# Patient Record
Sex: Female | Born: 1990 | Race: Black or African American | Hispanic: No | Marital: Single | Smoking: Current some day smoker
Health system: Southern US, Community
[De-identification: ages and names within clinical notes are randomized; demographics above are authoritative.]

## PROBLEM LIST (undated history)

## (undated) ENCOUNTER — Ambulatory Visit: Payer: Medicaid Other | Source: Home / Self Care

## (undated) DIAGNOSIS — L219 Seborrheic dermatitis, unspecified: Secondary | ICD-10-CM

## (undated) DIAGNOSIS — H60331 Swimmer's ear, right ear: Secondary | ICD-10-CM

## (undated) DIAGNOSIS — L0100 Impetigo, unspecified: Secondary | ICD-10-CM

## (undated) DIAGNOSIS — F419 Anxiety disorder, unspecified: Secondary | ICD-10-CM

## (undated) DIAGNOSIS — L409 Psoriasis, unspecified: Secondary | ICD-10-CM

## (undated) DIAGNOSIS — D509 Iron deficiency anemia, unspecified: Secondary | ICD-10-CM

## (undated) DIAGNOSIS — L810 Postinflammatory hyperpigmentation: Secondary | ICD-10-CM

## (undated) DIAGNOSIS — D649 Anemia, unspecified: Secondary | ICD-10-CM

## (undated) DIAGNOSIS — I10 Essential (primary) hypertension: Secondary | ICD-10-CM

## (undated) DIAGNOSIS — Z5189 Encounter for other specified aftercare: Secondary | ICD-10-CM

## (undated) DIAGNOSIS — L6681 Central centrifugal cicatricial alopecia: Secondary | ICD-10-CM

## (undated) DIAGNOSIS — L668 Other cicatricial alopecia: Secondary | ICD-10-CM

## (undated) DIAGNOSIS — K5010 Crohn's disease of large intestine without complications: Secondary | ICD-10-CM

## (undated) DIAGNOSIS — L669 Cicatricial alopecia, unspecified: Secondary | ICD-10-CM

## (undated) HISTORY — DX: Psoriasis, unspecified: L40.9

## (undated) HISTORY — PX: BOWEL RESECTION: SHX1257

## (undated) HISTORY — PX: UPPER GASTROINTESTINAL ENDOSCOPY: SHX188

## (undated) HISTORY — DX: Anemia, unspecified: D64.9

## (undated) HISTORY — PX: COLONOSCOPY: SHX174

## (undated) HISTORY — DX: Seborrheic dermatitis, unspecified: L21.9

## (undated) HISTORY — DX: Central centrifugal cicatricial alopecia: L66.81

## (undated) HISTORY — DX: Postinflammatory hyperpigmentation: L81.0

## (undated) HISTORY — DX: Swimmer's ear, right ear: H60.331

## (undated) HISTORY — DX: Encounter for other specified aftercare: Z51.89

## (undated) HISTORY — DX: Essential (primary) hypertension: I10

## (undated) HISTORY — DX: Iron deficiency anemia, unspecified: D50.9

## (undated) HISTORY — DX: Anxiety disorder, unspecified: F41.9

## (undated) HISTORY — DX: Other cicatricial alopecia: L66.8

## (undated) HISTORY — PX: APPENDECTOMY: SHX54

## (undated) HISTORY — DX: Cicatricial alopecia, unspecified: L66.9

## (undated) HISTORY — DX: Impetigo, unspecified: L01.00

---

## 2013-06-07 DIAGNOSIS — D849 Immunodeficiency, unspecified: Secondary | ICD-10-CM | POA: Insufficient documentation

## 2013-09-11 DIAGNOSIS — R21 Rash and other nonspecific skin eruption: Secondary | ICD-10-CM | POA: Insufficient documentation

## 2013-09-11 DIAGNOSIS — K6289 Other specified diseases of anus and rectum: Secondary | ICD-10-CM | POA: Insufficient documentation

## 2015-11-27 DIAGNOSIS — K5090 Crohn's disease, unspecified, without complications: Secondary | ICD-10-CM | POA: Insufficient documentation

## 2016-12-21 DIAGNOSIS — N83209 Unspecified ovarian cyst, unspecified side: Secondary | ICD-10-CM | POA: Insufficient documentation

## 2019-01-14 DIAGNOSIS — O1495 Unspecified pre-eclampsia, complicating the puerperium: Secondary | ICD-10-CM | POA: Insufficient documentation

## 2019-01-14 HISTORY — DX: Unspecified pre-eclampsia, complicating the puerperium: O14.95

## 2019-12-20 DIAGNOSIS — H16223 Keratoconjunctivitis sicca, not specified as Sjogren's, bilateral: Secondary | ICD-10-CM | POA: Diagnosis not present

## 2020-01-10 ENCOUNTER — Ambulatory Visit: Payer: Medicaid Other | Admitting: Family Medicine

## 2020-01-10 ENCOUNTER — Other Ambulatory Visit: Payer: Self-pay

## 2020-01-26 ENCOUNTER — Encounter: Payer: Self-pay | Admitting: Emergency Medicine

## 2020-01-26 ENCOUNTER — Other Ambulatory Visit: Payer: Self-pay

## 2020-01-26 ENCOUNTER — Ambulatory Visit
Admission: EM | Admit: 2020-01-26 | Discharge: 2020-01-26 | Disposition: A | Discharge status: Home / Self Care | Payer: Medicaid Other | Attending: Physician Assistant | Admitting: Physician Assistant

## 2020-01-26 DIAGNOSIS — L219 Seborrheic dermatitis, unspecified: Secondary | ICD-10-CM | POA: Diagnosis not present

## 2020-01-26 DIAGNOSIS — H16141 Punctate keratitis, right eye: Secondary | ICD-10-CM | POA: Diagnosis not present

## 2020-01-26 HISTORY — DX: Crohn's disease of large intestine without complications: K50.10

## 2020-01-26 MED ORDER — KETOCONAZOLE 2 % EX SHAM: 1.0000 "application " | MEDICATED_SHAMPOO | CUTANEOUS | 0 refills | Status: DC

## 2020-01-26 NOTE — ED Notes (Signed)
Patient able to ambulate independently  

## 2020-01-26 NOTE — Discharge Instructions (Signed)
Start ketoconazole shampoo as directed for 2-4 weeks. Mineral oil to the scalp. Avoid scratching area. Start allergy medicine such as zyrtec daily for the next 4 weeks. Follow up with PCP/dermatology if symptoms not improving.

## 2020-01-26 NOTE — ED Triage Notes (Signed)
Pt presents to [facility] for assessment of lumps to her head which are releasing discharge sometimes.  Patient states it has been going on x 1 month.  Patient states she is not currently established in the area with a PCP and is concerned because she has been off of her medications.

## 2020-01-26 NOTE — ED Provider Notes (Signed)
[name]    [name]: [id number] Arrival date & time: 01/26/20  1732      History   Chief Complaint Chief Complaint  Patient presents with  . Headache    HPI Erika Stephens is a 29 y.o. female.   29 year old female with history of Crohn's colitis comes in for 1 month history of scalp discomfort, " lumps" to the scalp.  States has had crusting/peeling of the scalp with pain.  Has had oozing/drainage that is clear.  Denies spreading erythema, warmth, fever.  At times has had itching and sensation.  Denies changes in hygiene product.  Has tried over-the-counter medications without relief.     Past Medical History:  Diagnosis Date  . Crohn's colitis ([place])     There are no problems to display for this patient.   Past Surgical History:  Procedure Laterality Date  . APPENDECTOMY    . BOWEL RESECTION      OB History   No obstetric history on file.      Home Medications    Prior to Admission medications   Medication Sig Start Date End Date Taking? Authorizing Provider  ketoconazole (NIZORAL) 2 % shampoo Apply 1 application topically 2 (two) times a week. 01/29/20   Ok Edwards, PA-C    Family History Family History  Problem Relation Age of Onset  . Crohn's disease Mother   . Schizophrenia Father   . Bipolar disorder Father     Social History Social History   Tobacco Use  . Smoking status: Never Smoker  . Smokeless tobacco: Never Used  Substance Use Topics  . Alcohol use: Yes    Comment: socially  . Drug use: Yes    Types: Marijuana     Allergies   Aspirin   Review of Systems Review of Systems  Reason unable to perform ROS: See HPI as above.     Physical Exam Triage Vital Signs ED Triage Vitals  Enc Vitals Group     BP 01/26/20 1744 113/69     Pulse Rate 01/26/20 1744 84     Resp 01/26/20 1744 16     Temp 01/26/20 1744 97.8 F (36.6 C)     Temp Source 01/26/20 1744 Temporal     SpO2 01/26/20 1744 99 %     Weight --      Height  --      Head Circumference --      Peak Flow --      Pain Score 01/26/20 1745 6     Pain Loc --      Pain Edu? --      Excl. in [place]? --    No data found.  Updated Vital Signs BP 113/69 (BP Location: Left Arm)   Pulse 84   Temp 97.8 F (36.6 C) (Temporal)   Resp 16   LMP 01/19/2020   SpO2 99%   Physical Exam Constitutional:      General: She is not in acute distress.    Appearance: Normal appearance. She is well-developed. She is not toxic-appearing or diaphoretic.  HENT:     Head: Normocephalic and atraumatic.  Eyes:     Conjunctiva/sclera: Conjunctivae normal.     Pupils: Pupils are equal, round, and reactive to light.  Pulmonary:     Effort: Pulmonary effort is normal. No respiratory distress.     Comments: Speaking in full sentences without difficulty Musculoskeletal:     Cervical back: Normal range of motion and neck supple.  Skin:    General: Skin is warm and dry.     Comments: Patches of dry, flaky and peeling skin to the scalp.  No surrounding erythema.  No golden crusts.  Neurological:     Mental Status: She is alert and oriented to person, place, and time.      UC Treatments / Results  Labs (all labs ordered are listed, but only abnormal results are displayed) Labs Reviewed - No data to display  EKG   Radiology No results found.  Procedures Procedures (including critical care time)  Medications Ordered in UC Medications - No data to display  Initial Impression / Assessment and Plan / UC Course  I have reviewed the triage vital signs and the nursing notes.  Pertinent labs & imaging results that were available during my care of the patient were reviewed by me and considered in my medical decision making (see chart for details).    Discussed history and exam most consistent with seborrheic dermatitis.  Will treat with ketoconazole shampoo, and mineral oil.  Return precautions given.  Otherwise patient to follow-up with PCP/dermatology for further  evaluation and management needed if symptoms not improving.  Patient expresses understanding and agrees to plan.  Final Clinical Impressions(s) / UC Diagnoses   Final diagnoses:  Seborrheic dermatitis of scalp   ED Prescriptions    Medication Sig Dispense Auth. Provider   ketoconazole (NIZORAL) 2 % shampoo Apply 1 application topically 2 (two) times a week. 120 mL Ok Edwards, PA-C     PDMP not reviewed this encounter.   Ok Edwards, PA-C 01/26/20 1906

## 2020-01-30 DIAGNOSIS — Z23 Encounter for immunization: Secondary | ICD-10-CM | POA: Diagnosis not present

## 2020-01-31 DIAGNOSIS — H16141 Punctate keratitis, right eye: Secondary | ICD-10-CM | POA: Diagnosis not present

## 2020-02-08 DIAGNOSIS — K5090 Crohn's disease, unspecified, without complications: Secondary | ICD-10-CM | POA: Diagnosis not present

## 2020-02-08 DIAGNOSIS — J3489 Other specified disorders of nose and nasal sinuses: Secondary | ICD-10-CM | POA: Diagnosis not present

## 2020-02-08 DIAGNOSIS — Z7689 Persons encountering health services in other specified circumstances: Secondary | ICD-10-CM | POA: Diagnosis not present

## 2020-02-12 ENCOUNTER — Emergency Department (HOSPITAL_COMMUNITY)
Admission: EM | Admit: 2020-02-12 | Discharge: 2020-02-12 | Disposition: A | Discharge status: Home / Self Care | Payer: Medicaid Other | Attending: Emergency Medicine | Admitting: Emergency Medicine

## 2020-02-12 ENCOUNTER — Other Ambulatory Visit: Payer: Self-pay

## 2020-02-12 ENCOUNTER — Encounter (HOSPITAL_COMMUNITY): Payer: Self-pay

## 2020-02-12 DIAGNOSIS — K5090 Crohn's disease, unspecified, without complications: Secondary | ICD-10-CM | POA: Insufficient documentation

## 2020-02-12 DIAGNOSIS — R21 Rash and other nonspecific skin eruption: Secondary | ICD-10-CM | POA: Diagnosis not present

## 2020-02-12 MED ORDER — PREDNISONE 20 MG PO TABS: 40.0000 mg | ORAL_TABLET | Freq: Every day | ORAL | 0 refills | Status: AC

## 2020-02-12 MED ORDER — CEPHALEXIN 500 MG PO CAPS: 500.0000 mg | ORAL_CAPSULE | Freq: Two times a day (BID) | ORAL | 0 refills | Status: AC

## 2020-02-12 NOTE — ED Notes (Signed)
Pt verbalizes understanding of DC instructions. Pt belongings returned and is ambulatory out of ED.  

## 2020-02-12 NOTE — ED Triage Notes (Signed)
Pt reports a rash on top of her head and a small lump on the posterior occipital left side of her head. Reports a hx of Crohn's and states that she is new to the area and needs her medications refill. Reports these scalp symptoms for about a month. States urgent care gave her shampoo for cradle cap, but it has not improved. A&Ox4.

## 2020-02-12 NOTE — Discharge Instructions (Addendum)
I have prescribed antibiotics to help treat your symptoms. Please take 1 tablet twice a day for the next 7 days.  I have provided a prescription for steroids.  Please take 2 tablets daily for the next 5 days.  Please be aware this medication can cause flushness, insomnia and appetite changes.  The number to dermatology is attached to your chart, please schedule an appointment with them for follow up.

## 2020-02-12 NOTE — ED Provider Notes (Signed)
Erika Stephens Provider Note   [name]: [id number] Arrival date & time: 02/12/20  2011     History Chief Complaint  Patient presents with  . Rash    Erika Stephens is a 29 y.o. female.  29 y.o female with a PMH of Chron's colitis sent to the ED with a chief complaint of scalp pain x 5 days. Patient was seen at urgent care on 31 March, she was given improved to help with what they thought was cradle cap.  The history is provided by the patient.  Rash Associated symptoms: no fever and no headaches        Past Medical History:  Diagnosis Date  . Crohn's colitis ([place])     There are no problems to display for this patient.   Past Surgical History:  Procedure Laterality Date  . APPENDECTOMY    . BOWEL RESECTION       OB History   No obstetric history on file.     Family History  Problem Relation Age of Onset  . Crohn's disease Mother   . Schizophrenia Father   . Bipolar disorder Father     Social History   Tobacco Use  . Smoking status: Never Smoker  . Smokeless tobacco: Never Used  Substance Use Topics  . Alcohol use: Yes    Comment: socially  . Drug use: Yes    Types: Marijuana    Home Medications Prior to Admission medications   Medication Sig Start Date End Date Taking? Authorizing Provider  cephALEXin (KEFLEX) 500 MG capsule Take 1 capsule (500 mg total) by mouth 2 (two) times daily for 7 days. 02/12/20 02/19/20  Erika Fitting, PA-C  ketoconazole (NIZORAL) 2 % shampoo Apply 1 application topically 2 (two) times a week. 01/29/20   Erika Stephens, Amy V, PA-C  predniSONE (DELTASONE) 20 MG tablet Take 2 tablets (40 mg total) by mouth daily for 5 days. 02/12/20 02/17/20  Erika Fitting, PA-C    Allergies    Aspirin  Review of Systems   Review of Systems  Constitutional: Negative for fever.  Skin: Positive for rash.  Neurological: Negative for light-headedness and headaches.    Physical Exam Updated Vital Signs BP (!) 144/82 (BP  Location: Right Arm)   Pulse 83   Temp 99.2 F (37.3 C) (Oral)   Resp 16   Ht 5' 3"  (1.6 m)   Wt 65.8 kg   LMP 01/19/2020   SpO2 100%   BMI 25.69 kg/m   Physical Exam Vitals and nursing note reviewed.  Constitutional:      Appearance: Normal appearance.  HENT:     Head: Normocephalic and atraumatic.      Comments: Pustular crusting lesions noted throughout, patch with oozing noted from the occipital aspect, appears irritated.     Nose: Nose normal.     Mouth/Throat:     Mouth: Mucous membranes are moist.  Eyes:     Pupils: Pupils are equal, round, and reactive to light.  Cardiovascular:     Rate and Rhythm: Normal rate.  Pulmonary:     Effort: Pulmonary effort is normal.     Breath sounds: No wheezing.  Abdominal:     General: Abdomen is flat.  Musculoskeletal:     Cervical back: Normal range of motion and neck supple.  Skin:    General: Skin is warm.     Findings: Erythema present.  Neurological:     Mental Status: She is alert and oriented to person, place,  and time.     ED Results / Procedures / Treatments   Labs (all labs ordered are listed, but only abnormal results are displayed) Labs Reviewed - No data to display  EKG None  Radiology No results found.  Procedures Procedures (including critical care time)  Medications Ordered in ED Medications - No data to display  ED Course  I have reviewed the triage vital signs and the nursing notes.  Pertinent labs & imaging results that were available during my care of the patient were reviewed by me and considered in my medical decision making (see chart for details).    MDM Rules/Calculators/A&P    Patient arrives to the ED with complaints of rash which began 5 days ago.  She was seen at urgent care, was told this was likely cradle cap, was given shampoo to help with her symptoms without improvement in her symptoms.  Today she arrived with multiple pustular lesions around the left side of her head,  no vesicular lesion noted.  Does report pain to the area along with itching.  She was lying ketoconazole shampoo which she reports is likely worsened her symptoms.  No changes in vision, no headaches, no involvement of the facial region.  Not in a dermatomal pattern, lower suspicion for shingles.  She is a history of Crohn's, has not had her Remicade medication x2 as she reports she recently relocated here from out of town.Therefore has not been on steroid therapy.   Lesions appear inflammatory in nature.  Denies any fevers, changes in vision, neck pain.  We discussed treatment with steroids along with antibiotics to help with symptoms.  She is agreeable to this therapy.  She will also need to follow-up with dermatology in order to obtain further supervision of rash. No mucosal involvement. Patient stable for discharge.    Portions of this note were generated with [employer]. Dictation errors may occur despite best attempts at proofreading.  Final Clinical Impression(s) / ED Diagnoses Final diagnoses:  Rash    Rx / DC Orders ED Discharge Orders         Ordered    predniSONE (DELTASONE) 20 MG tablet  Daily     02/12/20 2244    cephALEXin (KEFLEX) 500 MG capsule  2 times daily     02/12/20 2244           Erika Fitting, PA-C 02/12/20 2253    Valarie Merino, MD 02/14/20 [phone]

## 2020-02-13 ENCOUNTER — Telehealth: Payer: Self-pay | Admitting: Gastroenterology

## 2020-02-13 NOTE — Telephone Encounter (Signed)
Dr. Rush Landmark, (D.O.D)  We have received a referral for this pt for Crohns disease/ remicade infusion.  This pt has GI HX out of state [facility] [place])  Pt has indicated that it has been less than a year that she was seen.  She just moved here.  She stated that she is having issues and is due for colonoscopy as well.  I have asked her to obtain records/pathology for your review. Is it possible to see this pt without these records first? Please  Advise.

## 2020-02-14 NOTE — Telephone Encounter (Signed)
Patient could be seen, however, I cannot and do not feel comfortable with placing any infusion information without it having been reviewed. She will need her prior providers or her PCP to have this completed until she can be seen in GI. She can be seen with any of the GI providers or with the PAs/Nps but without the information and timing and dosing and history it is hard for any of [place] to move forward with placing Remicade orders. She can be placed on my schedule on a 350 slot but needs to have her history/information all sent to [place] for review prior to the clinic visit. Thank you. GM

## 2020-02-21 DIAGNOSIS — L219 Seborrheic dermatitis, unspecified: Secondary | ICD-10-CM | POA: Diagnosis not present

## 2020-02-27 DIAGNOSIS — Z23 Encounter for immunization: Secondary | ICD-10-CM | POA: Diagnosis not present

## 2020-02-27 DIAGNOSIS — L309 Dermatitis, unspecified: Secondary | ICD-10-CM | POA: Diagnosis not present

## 2020-02-27 DIAGNOSIS — L219 Seborrheic dermatitis, unspecified: Secondary | ICD-10-CM | POA: Diagnosis not present

## 2020-02-29 NOTE — Telephone Encounter (Signed)
Letter mailed to have the pt call our office

## 2020-02-29 NOTE — Telephone Encounter (Signed)
I have partially reviewed her notes that have come in. She has been followed in [facility] by [facility]. However, no clinic note documentation since 2018. Last infusion that I can see in the documentation was in May 2020. Please get the last 4 years worth of clinic notes so that I can review this. Please get her most recent Colonoscopy (last I see was in 2018). Please get the most recent notes in regards to her Remicade infusions. These will be helpful before her clinic visit next week. Thanks. GM

## 2020-02-29 NOTE — Telephone Encounter (Signed)
Tried to reach pt and no answer, voice mail full.

## 2020-03-07 ENCOUNTER — Ambulatory Visit: Payer: Medicaid Other | Admitting: Gastroenterology

## 2020-03-07 ENCOUNTER — Telehealth: Payer: Self-pay

## 2020-03-07 ENCOUNTER — Other Ambulatory Visit: Payer: Self-pay

## 2020-03-07 ENCOUNTER — Encounter: Payer: Self-pay | Admitting: Gastroenterology

## 2020-03-07 VITALS — BP 102/62 | HR 90 | Temp 97.8°F | Ht 61.0 in | Wt 137.0 lb

## 2020-03-07 DIAGNOSIS — R140 Abdominal distension (gaseous): Secondary | ICD-10-CM | POA: Diagnosis not present

## 2020-03-07 DIAGNOSIS — Z79899 Other long term (current) drug therapy: Secondary | ICD-10-CM

## 2020-03-07 DIAGNOSIS — R1084 Generalized abdominal pain: Secondary | ICD-10-CM | POA: Diagnosis not present

## 2020-03-07 DIAGNOSIS — K50819 Crohn's disease of both small and large intestine with unspecified complications: Secondary | ICD-10-CM

## 2020-03-07 DIAGNOSIS — Z9225 Personal history of immunosupression therapy: Secondary | ICD-10-CM

## 2020-03-07 DIAGNOSIS — Z862 Personal history of diseases of the blood and blood-forming organs and certain disorders involving the immune mechanism: Secondary | ICD-10-CM

## 2020-03-07 DIAGNOSIS — K529 Noninfective gastroenteritis and colitis, unspecified: Secondary | ICD-10-CM | POA: Diagnosis not present

## 2020-03-07 DIAGNOSIS — Z79620 Long term (current) use of immunosuppressive biologic: Secondary | ICD-10-CM

## 2020-03-07 MED ORDER — SUPREP BOWEL PREP KIT 17.5-3.13-1.6 GM/177ML PO SOLN: 1.0000 | ORAL | 0 refills | Status: DC

## 2020-03-07 NOTE — Progress Notes (Signed)
[place] VISIT   Primary Care Provider Medicine, [facility] [place] [address] [phone]  Referring Provider Medicine, [facility] [address] [place],  [place] [address] [phone]  Patient Profile: Erika Stephens is a 29 y.o. female with a pmh significant for Crohn's disease (diagnosed in 2011 and maintained on Remicade on/off in [place] and then [place]), status post appendectomy and ileocecectomy, anal fissures, possible psoriasis, MDD/anxiety, iron deficiency anemia, status post 3 pregnancies.  The patient presents to the [facility] for an evaluation and management of problem(s) noted below:  Problem List 1. Crohn's disease of small and large intestines with complication ([place])   2. Generalized abdominal pain   3. Bloating symptom   4. Chronic diarrhea   5. History of iron deficiency anemia   6. Infliximab (Remicade) long-term use   7. History of immunosuppression therapy     History of Present Illness This is the patient's first visit to the outpatient [place] clinic.  The patient previously lived in [place] where she was diagnosed with Crohn's disease in 2011 at [facility].  I do not have access to those records but this is per the patient's report as well as her [place] provider notation.  Underwent an appendectomy and subsequently an ileocecectomy due to patient continuing to have issues after her appendectomy.  It was at this point in time in 2015 that she ended up being transitioned to Remicade after being on/off steroids per her report.  The patient then moved to [place] where she had been for 3 years and was following with GI providers in one particular group though never saw the same provider over extended periods.  I was able to review the records as well as the Remicade infusions that had been done.  Patient moved to [place] with family for an attempt at a  new chance at life.  She moved in January.  Her last Remicade infusion was in December although I do not have the actual record of that.  Patient states that since January she has had multiple issues develop.  She is having abdominal pain throughout her abdomen.  She is having pyrosis.  She has abdominal bloating as well as distention.  She has had increased flatulence.  She has diarrhea 2-3 times per day noted as a Bristol scale 6-7.  She notes no blood.  She does have fecal urgency.  She has incomplete evacuation.  She has nocturnal symptoms that wake her up at night at times.  She has had a 25 pound weight loss since her last infusion.  Patient was recently diagnosed by [facility] dermatology with new psoriasis.  She has never had psoriasis previously.  She is also noting new lesions on her upper extremities as well as upper thorax.  Patient has been receiving budesonide on/off from her grandmother who has a history of Crohn's disease.  Is not clear that the budesonide has been helpful at all over the course the last 2 months.  Patient is unemployed but recently received [facility].  She has 3 children.  Patient denies any significant nonsteroidal use.  She has not had an upper endoscopy per her report in greater than 10 years.  Her last colonoscopy was in 2018 as documented below.  GI Review of Systems Positive as above Negative for dysphagia, odynophagia, nausea, vomiting, melena, hematochezia  Review of Systems General: Denies fevers/chills/ HEENT: Denies oral lesions/visual changes Cardiovascular: Denies chest pain/palpitations  Pulmonary: Denies shortness of breath/cough Gastroenterological: See HPI Genitourinary: Denies darkened urine or hematuria Hematological: Denies easy bruising/bleeding Endocrine: Denies temperature intolerance Dermatological: Multiple skin manifestations as well as scalp psoriasis; denies jaundice Psychological: Mood is anxious to get better Allergy &  Immunology: Denies severe allergic reactions Musculoskeletal: Has history of previous arthralgias though those are not causing her issues currently s   Medications Current Outpatient Medications  Medication Sig Dispense Refill  . ketoconazole (NIZORAL) 2 % shampoo Apply 1 application topically 2 (two) times a week. 120 mL 0  . Na Sulfate-K Sulfate-Mg Sulf (SUPREP BOWEL PREP KIT) 17.5-3.13-1.6 GM/177ML SOLN Take 1 kit by mouth as directed. For colonoscopy prep 354 mL 0   No current facility-administered medications for this visit.    Allergies Allergies  Allergen Reactions  . Aspirin Hives    Histories Past Medical History:  Diagnosis Date  . Crohn's colitis [facility])    Past Surgical History:  Procedure Laterality Date  . APPENDECTOMY    . BOWEL RESECTION     Social History   Socioeconomic History  . Marital status: Unknown    Spouse name: Not on file  . Number of children: Not on file  . Years of education: Not on file  . Highest education level: Not on file  Occupational History  . Not on file  Tobacco Use  . Smoking status: Never Smoker  . Smokeless tobacco: Never Used  Substance and Sexual Activity  . Alcohol use: Yes    Comment: socially.   . Drug use: Yes    Types: Marijuana    Comment: few times a week   . Sexual activity: Not Currently  Other Topics Concern  . Not on file  Social History Narrative  . Not on file   Social Determinants of Health   Financial Resource Strain:   . Difficulty of Paying Living Expenses:   Food Insecurity:   . Worried About Charity fundraiser in the Last Year:   . Arboriculturist in the Last Year:   Transportation Needs:   . Film/video editor (Medical):   Marland Kitchen Lack of Transportation (Non-Medical):   Physical Activity:   . Days of Exercise per Week:   . Minutes of Exercise per Session:   Stress:   . Feeling of Stress :   Social Connections:   . Frequency of Communication with Friends and Family:   . Frequency of  Social Gatherings with Friends and Family:   . Attends Religious Services:   . Active Member of Clubs or Organizations:   . Attends Archivist Meetings:   Marland Kitchen Marital Status:   Intimate Partner Violence:   . Fear of Current or Ex-Partner:   . Emotionally Abused:   Marland Kitchen Physically Abused:   . Sexually Abused:    Family History  Problem Relation Age of Onset  . Crohn's disease Mother   . Schizophrenia Father   . Bipolar disorder Father   . Crohn's disease Maternal Grandmother   . Prostate cancer Maternal Uncle   . Colon cancer Neg Hx   . Stomach cancer Neg Hx   . Pancreatic cancer Neg Hx   . Esophageal cancer Neg Hx   . Inflammatory bowel disease Neg Hx   . Liver disease Neg Hx   . Rectal cancer Neg Hx    I have reviewed her medical, social, and family history in detail and updated the electronic medical record as necessary.    PHYSICAL EXAMINATION  BP  102/62   Pulse 90   Temp 97.8 F (36.6 C)   Ht 5' 1"  (1.549 m)   Wt 137 lb (62.1 kg)   BMI 25.89 kg/m  Wt Readings from Last 3 Encounters:  03/07/20 137 lb (62.1 kg)  02/12/20 145 lb (65.8 kg)  GEN: NAD, appears stated age, doesn't appear chronically ill PSYCH: Cooperative, without pressured speech EYE: Conjunctivae pink, sclerae anicteric ENT: MMM, without oral ulcers, no erythema or exudates noted NECK: Supple CV: RR without R/Gs  RESP: CTAB posteriorly, without wheezing GI: NABS, soft, tenderness to palpation generalized throughout the abdomen, volitional guarding, no rebound, surgical scars present from previous resections MSK/EXT: No lower extremity edema SKIN: Tattoos present, patient has multiple darkened papules though not clearly erythema nodosum or pyoderma, plaque psoriasis noted on scalp; no jaundice NEURO:  Alert & Oriented x 3, no focal deficits   REVIEW OF DATA  I reviewed the following data at the time of this encounter:  GI Procedures and Studies  November 2016 colonoscopy done at [facility]  GI Perianal and digital rectal exams were normal. The terminal ileum appeared normal.  Biopsies were taken for histology. There was evidence of a prior end-to-end ileocolonic anastomosis in the ascending colon which was characterized by healthy-appearing mucosa.  The anastomosis was traversed. Pathology Terminal ileal biopsies show active ileitis without granulomas or crypt distortion.  Findings are nonspecific but could include Crohn's disease or infection or medication injury. Right colon biopsies were normal showing no evidence of crypt distortion disarray or inflammation and negative for dysplasia. Left colon biopsies were negative for crypt distortion, disarray, significant inflammation.  Negative for dysplasia.  June 2018 colonoscopy Normal mucosa entire colon.  Biopsies were taken for histology. There was evidence of a prior end-to-side ileocolonic anastomosis in the ascending colon.  This appeared healthy.  Anastomosis was traversed.  Biopsies were obtained.  Terminal ileum was normal.  Nonbleeding external and internal hemorrhoids were noted. Pathology consistent with small bowel mucosa with mild active inflammation.  Random colon biopsies showed no evidence of active chronic inflammation  Laboratory Studies  2016 labs Vitamin A normal Vitamin D normal Hemoglobin/hematocrit 12.6/38.3 MCV 85 Platelets 257 BUN 9 Creatinine 1.85 AST/ALT 18/14 Alk phos 50 Total bilirubin 0.4 TPMT activity 23.5 units per mill RBC (normal based on this particular lab was 15.1-26.4 thus she did not show heterozygous or homozygous mutation Hep C antibody negative Hep B surface antibody reactive Hepatitis B core total negative Vitamin D 30.2 QuantiFERON gold negative ESR 13 CRP 2.8  2017 labs WBC 9.5 Hemoglobin/hematocrit 9.8/31.7 MCV 76 Platelets 307 AST/ALT 19/14 Alk phos 64 Total bili 0.3  Imaging Studies  January 2018 CT abdomen pelvis with contrast outside report Postoperative  changes of right lower quadrant ileocolonic anastomosis without evidence of bowel obstruction or active disease. Enlarged uterus with endometrial thickening. Moderately enlarged left ovarian cyst of 3.1 cm.  Representing a potential functional cyst but may require follow-up ultrasound. Pessary in place  Outside Records  November 2016 progress note by Dr. Nolon Stalls Patient who had moved from [place] to [place] and then subsequently to [facility] for new patient evaluation and history of Crohn's.  She was having significant abdominal pain as well as swelling and arthralgias.  Assessment suggests that there is no access of records to be able to safely prescribe Remicade.  They were going to try to reach out to get Remicade.  They were getting consider a DEXA scan due to prior steroid use.  They move forward  with scheduling colonoscopy. GI pathogen panel was negative  March 2017 Remicade infusion 10 mg/kg  May 2020 Remicade infusion 10 mg/kg March 2020 Remicade infusion 10 mg/kg January 2020 Remicade infusion 10 mg/kg December 2019 Remicade infusion 10 mg/kg November 2019 Remicade infusion 10 mg/kg October 2018 Remicade infusion 10 mg/kg  January 2018 progress note by Dr. Alfonso Ramus This was a follow-up appointment.  The patient was currently on Imuran as well as prednisone as well as Remicade based on the medication list.  No major changes were made in the patient's progress note at that time  November 2017 Remicade infusion 10 mg/kg September 2017 Remicade infusion 10 mg/kg July 2017 Remicade infusion 10 mg/kg May 2017 Remicade infusion 10 mg/kg   ASSESSMENT  Ms. Wisby is a 29 y.o. female with a pmh significant for Crohn's disease (diagnosed in 2011 and maintained on Remicade on/off in [place] and then [place]), status post appendectomy and ileocecectomy, anal fissures, possible psoriasis, MDD/anxiety, iron deficiency anemia, status post 3 pregnancies.  The patient is seen today for  evaluation and management of:  1. Crohn's disease of small and large intestines with complication ([place])   2. Generalized abdominal pain   3. Bloating symptom   4. Chronic diarrhea   5. History of iron deficiency anemia   6. Infliximab (Remicade) long-term use   7. History of immunosuppression therapy    The patient is hemodynamically stable.  She has a documented history of active ileitis on 2 colonoscopies although overt pathology consistent with true chronicity was not documented.  Her history of previous ileocecectomy in setting of an appendectomy does give rise to the possibility of this long-term disease.  She has been on/off Remicade though last dose of Remicade was in December.  I discussed with her that this cannot continue to be an issue because of the chance of developing antibodies as well as the risk of antibodies leading to anaphylaxis.  I will plan to proceed with reinitiation of her Remicade at 10 mg/kg.  I will not plan to reinitiate her at 0/2/6 weeks but rather just go to an every 8-week schedule.  After 6 months of therapy we will do therapeutic drug management evaluation.  I will do upfront antibody testing to see if she has developed significant antibodies that may make [place] have to think about changing her earlier than planned.  I am interested in how her psoriasis as well as other skin manifestations look after reinitiating her biologic agent.  We discussed the risks of immunosuppressive therapy including increased risk for cancers as well as infectious etiologies.  I am planning to get some laboratories to have our own baseline here in regards to knowing that she can continue immunosuppressive agents.  I will have to discuss with her whether and why she came off Imuran at some point in the past as we not clear to me based on notation in the chart.  I suspect it may have been related to the patient still being active and family planning for pregnancy.  I plan to get basic laboratories  as well as other laboratories that will help guide me in regards to other of her symptoms.  She is at risk for SIBO.  EPI less likely but may need to be considered in future.  Bile salt diarrhea as a result of her illness sick ectomy needs to be considered also.  Over the course of the coming months we will hopefully be able to get a better sense of her symptoms  and get them under control.  We will plan a diagnostic upper and lower endoscopy with biopsies as well.  The risks and benefits of endoscopic evaluation were discussed with the patient; these include but are not limited to the risk of perforation, infection, bleeding, missed lesions, lack of diagnosis, severe illness requiring hospitalization, as well as anesthesia and sedation related illnesses.  The patient is agreeable to proceed.  All patient questions were answered, to the best of my ability, and the patient agrees to the aforementioned plan of action with follow-up as indicated.   PLAN  Laboratories as outlined below Stool studies as outlined below Diagnostic endoscopy/colonoscopy Remicade 10 mg/kg infusions to be reinitiated and plan for premedications with Tylenol/Benadryl/Solu-Medrol as per protocol Consider SIBO evaluation in future Consider EPI evaluation in future Consider role of bile salt resin in future Patient will need DEXA scan at some point in future Holding on steroid therapy at this time Told patient to stop taking grandmothers budesonide   Orders Placed This Encounter  Procedures  . Calprotectin, Fecal  . CBC w/Diff  . Comp Met (CMET)  . Magnesium  . Phosphorus  . TSH  . Cortisol  . Sedimentation rate  . CRP High sensitivity  . IBC + Ferritin  . Haptoglobin  . B12  . Folate  . IgA  . Tissue transglutaminase, IgA  . INR/PT  . Hepatitis A antibody, total  . Hepatitis B Surface AntiBODY  . Hepatitis B surface antigen  . Hepatitis B Core Antibody, total  . Hepatitis C Antibody  . HIV antibody (with  reflex)  . QuantiFERON-TB Gold Plus  . Ambulatory referral to Gastroenterology    New Prescriptions   NA SULFATE-K SULFATE-MG SULF (SUPREP BOWEL PREP KIT) 17.5-3.13-1.6 GM/177ML SOLN    Take 1 kit by mouth as directed. For colonoscopy prep   Modified Medications   No medications on file    Planned Follow Up No follow-ups on file.   Total Time in Face-to-Face and in Coordination of Care for patient including independent/personal interpretation/review of prior testing, medical history, examination, medication adjustment, communicating results with the patient directly, and documentation with the EHR is 60 minutes.   Justice Britain, MD [facility] Advanced [facility]oscopy Office # [phone]

## 2020-03-07 NOTE — Patient Instructions (Signed)
If you are age 30 or older, your body mass index should be between 23-30. Your Body mass index is 25.89 kg/m. If this is out of the aforementioned range listed, please consider follow up with your Primary Care Provider.  If you are age 80 or younger, your body mass index should be between 19-25. Your Body mass index is 25.89 kg/m. If this is out of the aformentioned range listed, please consider follow up with your Primary Care Provider.    Your provider has requested that you go to the basement level for lab work before leaving today. Press "B" on the elevator. The lab is located at the first door on the left as you exit the elevator.  Due to recent changes in healthcare laws, you may see the results of your imaging and laboratory studies on MyChart before your provider has had a chance to review them.  We understand that in some cases there may be results that are confusing or concerning to you. Not all laboratory results come back in the same time frame and the provider may be waiting for multiple results in order to interpret others.  Please give [place] 48 hours in order for your provider to thoroughly review all the results before contacting the office for clarification of your results.   You have been scheduled for an endoscopy and colonoscopy. Please follow the written instructions given to you at your visit today. Please pick up your prep supplies at the pharmacy within the next 1-3 days. If you use inhalers (even only as needed), please bring them with you on the day of your procedure.  We have sent the following medications to your pharmacy for you to pick up at your convenience: Suprep   Thank you for choosing me and [facility].  Dr. Rush Landmark

## 2020-03-07 NOTE — Telephone Encounter (Signed)
-----   Message from [place], [place] sent at 03/07/2020  4:30 PM EDT ----- Regarding: Remicade Erika Stephens,   We seen this pt today. She is new to our office. Per Dr.Mansouraty she needs to be set up for Remicade 69m/kg. He did not tell me how often, but he will put details in his note.   Thanks,  [place]

## 2020-03-08 ENCOUNTER — Other Ambulatory Visit (INDEPENDENT_AMBULATORY_CARE_PROVIDER_SITE_OTHER): Payer: Medicaid Other

## 2020-03-08 ENCOUNTER — Encounter: Payer: Self-pay | Admitting: Gastroenterology

## 2020-03-08 ENCOUNTER — Telehealth: Payer: Self-pay

## 2020-03-08 DIAGNOSIS — K529 Noninfective gastroenteritis and colitis, unspecified: Secondary | ICD-10-CM | POA: Insufficient documentation

## 2020-03-08 DIAGNOSIS — Z862 Personal history of diseases of the blood and blood-forming organs and certain disorders involving the immune mechanism: Secondary | ICD-10-CM | POA: Insufficient documentation

## 2020-03-08 DIAGNOSIS — K50819 Crohn's disease of both small and large intestine with unspecified complications: Secondary | ICD-10-CM

## 2020-03-08 DIAGNOSIS — Z79899 Other long term (current) drug therapy: Secondary | ICD-10-CM | POA: Insufficient documentation

## 2020-03-08 DIAGNOSIS — R1084 Generalized abdominal pain: Secondary | ICD-10-CM | POA: Insufficient documentation

## 2020-03-08 DIAGNOSIS — K50818 Crohn's disease of both small and large intestine with other complication: Secondary | ICD-10-CM | POA: Insufficient documentation

## 2020-03-08 DIAGNOSIS — R140 Abdominal distension (gaseous): Secondary | ICD-10-CM | POA: Insufficient documentation

## 2020-03-08 DIAGNOSIS — Z9225 Personal history of immunosupression therapy: Secondary | ICD-10-CM | POA: Insufficient documentation

## 2020-03-08 DIAGNOSIS — Z79620 Long term (current) use of immunosuppressive biologic: Secondary | ICD-10-CM | POA: Insufficient documentation

## 2020-03-08 LAB — MAGNESIUM: Magnesium: 1.8 mg/dL (ref 1.5–2.5)

## 2020-03-08 LAB — CBC WITH DIFFERENTIAL/PLATELET
Basophils Absolute: 0.1 10*3/uL (ref 0.0–0.1)
Basophils Relative: 1.1 % (ref 0.0–3.0)
Eosinophils Absolute: 0.1 10*3/uL (ref 0.0–0.7)
Eosinophils Relative: 1.5 % (ref 0.0–5.0)
HCT: 29.9 % — ABNORMAL LOW (ref 36.0–46.0)
Hemoglobin: 9.1 g/dL — ABNORMAL LOW (ref 12.0–15.0)
Lymphocytes Relative: 26.1 % (ref 12.0–46.0)
Lymphs Abs: 2.4 10*3/uL (ref 0.7–4.0)
MCHC: 30.6 g/dL (ref 30.0–36.0)
MCV: 69.7 fl — ABNORMAL LOW (ref 78.0–100.0)
Monocytes Absolute: 0.7 10*3/uL (ref 0.1–1.0)
Monocytes Relative: 7.9 % (ref 3.0–12.0)
Neutro Abs: 5.8 10*3/uL (ref 1.4–7.7)
Neutrophils Relative %: 63.4 % (ref 43.0–77.0)
Platelets: 318 10*3/uL (ref 150.0–400.0)
RBC: 4.28 Mil/uL (ref 3.87–5.11)
RDW: 17.4 % — ABNORMAL HIGH (ref 11.5–15.5)
WBC: 9.1 10*3/uL (ref 4.0–10.5)

## 2020-03-08 LAB — COMPREHENSIVE METABOLIC PANEL
ALT: 9 U/L (ref 0–35)
AST: 16 U/L (ref 0–37)
Albumin: 4.2 g/dL (ref 3.5–5.2)
Alkaline Phosphatase: 86 U/L (ref 39–117)
BUN: 14 mg/dL (ref 6–23)
CO2: 23 mEq/L (ref 19–32)
Calcium: 9.6 mg/dL (ref 8.4–10.5)
Chloride: 105 mEq/L (ref 96–112)
Creatinine, Ser: 0.82 mg/dL (ref 0.40–1.20)
GFR: 81.97 mL/min (ref >60.00)
Glucose, Bld: 80 mg/dL (ref 70–99)
Potassium: 3.7 mEq/L (ref 3.5–5.1)
Sodium: 136 mEq/L (ref 135–145)
Total Bilirubin: 0.4 mg/dL (ref 0.2–1.2)
Total Protein: 8.4 g/dL — ABNORMAL HIGH (ref 6.0–8.3)

## 2020-03-08 LAB — IBC + FERRITIN
Ferritin: 2.6 ng/mL — ABNORMAL LOW (ref 10.0–291.0)
Iron: <10 ug/dL — ABNORMAL LOW (ref 42–145)
Saturation Ratios: 1.9 % — ABNORMAL LOW (ref 20.0–50.0)
Transferrin: 374 mg/dL — ABNORMAL HIGH (ref 212.0–360.0)

## 2020-03-08 LAB — VITAMIN B12: Vitamin B-12: 350 pg/mL (ref 211–911)

## 2020-03-08 LAB — PROTIME-INR
INR: 1.1 ratio — ABNORMAL HIGH (ref 0.8–1.0)
Prothrombin Time: 12.2 s (ref 9.6–13.1)

## 2020-03-08 LAB — HIGH SENSITIVITY CRP: CRP, High Sensitivity: 1.22 mg/L (ref 0.000–5.000)

## 2020-03-08 LAB — FOLATE: Folate: 16.6 ng/mL (ref >5.9)

## 2020-03-08 LAB — CORTISOL: Cortisol, Plasma: 6.2 ug/dL

## 2020-03-08 LAB — SEDIMENTATION RATE: Sed Rate: 79 mm/hr — ABNORMAL HIGH (ref 0–20)

## 2020-03-08 LAB — TSH: TSH: 0.9 u[IU]/mL (ref 0.35–4.50)

## 2020-03-08 LAB — IGA: IgA: 680 mg/dL — ABNORMAL HIGH (ref 68–378)

## 2020-03-08 LAB — PHOSPHORUS: Phosphorus: 3 mg/dL (ref 2.3–4.6)

## 2020-03-08 NOTE — Telephone Encounter (Signed)
See alternate note

## 2020-03-08 NOTE — Telephone Encounter (Signed)
-----   Message from Irving Copas., MD sent at 03/08/2020  5:46 AM EDT ----- Chong Sicilian, I will finalize my note later today. Remicade 10 mg/kg every 8 weeks is current plan. She will need Normal premedications with Tylenol/Benadryl +/- Antiemetic. Thanks. GM

## 2020-03-11 LAB — QUANTIFERON-TB GOLD PLUS
Mitogen-NIL: >10 IU/mL
NIL: 0.04 IU/mL
QuantiFERON-TB Gold Plus: NEGATIVE
TB1-NIL: 0.02 IU/mL
TB2-NIL: 0.02 IU/mL

## 2020-03-11 LAB — HAPTOGLOBIN: Haptoglobin: 237 mg/dL — ABNORMAL HIGH (ref 43–212)

## 2020-03-11 LAB — HEPATITIS B CORE ANTIBODY, TOTAL: Hep B Core Total Ab: NONREACTIVE

## 2020-03-11 LAB — HEPATITIS C ANTIBODY
Hepatitis C Ab: NONREACTIVE
SIGNAL TO CUT-OFF: 0.05 (ref <1.00)

## 2020-03-11 LAB — HEPATITIS B SURFACE ANTIBODY,QUALITATIVE: Hep B S Ab: REACTIVE — AB

## 2020-03-11 LAB — HEPATITIS A ANTIBODY, TOTAL: Hepatitis A AB,Total: REACTIVE — AB

## 2020-03-11 LAB — HEPATITIS B SURFACE ANTIGEN: Hepatitis B Surface Ag: NONREACTIVE

## 2020-03-11 LAB — TISSUE TRANSGLUTAMINASE, IGA: (tTG) Ab, IgA: 2 U/mL

## 2020-03-11 LAB — HIV ANTIBODY (ROUTINE TESTING W REFLEX): HIV 1&2 Ab, 4th Generation: NONREACTIVE

## 2020-03-11 NOTE — Telephone Encounter (Signed)
Ambulatory referral sent to [facility] to get prior auth for Remicade.

## 2020-03-11 NOTE — Progress Notes (Signed)
The patient has been notified of this information and all questions answered. She will begin iron daily.  See alternate note regarding remicade.

## 2020-03-12 ENCOUNTER — Ambulatory Visit: Payer: Medicaid Other | Admitting: Gastroenterology

## 2020-03-12 ENCOUNTER — Other Ambulatory Visit: Payer: Medicaid Other

## 2020-03-12 DIAGNOSIS — Z862 Personal history of diseases of the blood and blood-forming organs and certain disorders involving the immune mechanism: Secondary | ICD-10-CM | POA: Diagnosis not present

## 2020-03-12 DIAGNOSIS — K50819 Crohn's disease of both small and large intestine with unspecified complications: Secondary | ICD-10-CM

## 2020-03-12 NOTE — Telephone Encounter (Signed)
Amy any news on the Remicade prior auth?

## 2020-03-13 NOTE — Telephone Encounter (Signed)
No auth req

## 2020-03-13 NOTE — Telephone Encounter (Signed)
Records faxed to [facility] for Remicade start. Pt aware

## 2020-03-14 LAB — CALPROTECTIN, FECAL: Calprotectin, Fecal: 66 ug/g (ref 0–120)

## 2020-03-15 ENCOUNTER — Other Ambulatory Visit: Payer: Self-pay

## 2020-03-15 DIAGNOSIS — K50819 Crohn's disease of both small and large intestine with unspecified complications: Secondary | ICD-10-CM

## 2020-03-15 NOTE — Telephone Encounter (Signed)
The appt has been scheduled for 04/02/20 at 830 am at the [facility] [place]

## 2020-03-15 NOTE — Telephone Encounter (Signed)
[facility] just called about referral. They cannot process referral because they do not par with Medicaid.

## 2020-03-15 NOTE — Telephone Encounter (Signed)
The pt has been advised of the appt information.

## 2020-04-02 ENCOUNTER — Ambulatory Visit (HOSPITAL_COMMUNITY): Payer: Medicaid Other

## 2020-04-03 ENCOUNTER — Ambulatory Visit (HOSPITAL_COMMUNITY)
Admission: RE | Admit: 2020-04-03 | Discharge: 2020-04-03 | Disposition: A | Discharge status: Home / Self Care | Payer: Medicaid Other | Source: Ambulatory Visit | Attending: Internal Medicine | Admitting: Internal Medicine

## 2020-04-03 ENCOUNTER — Other Ambulatory Visit: Payer: Self-pay

## 2020-04-03 DIAGNOSIS — K50819 Crohn's disease of both small and large intestine with unspecified complications: Secondary | ICD-10-CM | POA: Insufficient documentation

## 2020-04-03 MED ORDER — SODIUM CHLORIDE 0.9 % IV SOLN
10.0000 mg/kg | INTRAVENOUS | Status: DC
  Administered 2020-04-03: 600 mg via INTRAVENOUS
  Filled 2020-04-03: qty 60

## 2020-04-03 MED ORDER — DIPHENHYDRAMINE HCL 25 MG PO CAPS
25.0000 mg | ORAL_CAPSULE | ORAL | Status: DC
  Administered 2020-04-03: 25 mg via ORAL
  Filled 2020-04-03: qty 1

## 2020-04-03 MED ORDER — ACETAMINOPHEN 325 MG PO TABS
650.0000 mg | ORAL_TABLET | ORAL | Status: DC
  Administered 2020-04-03: 650 mg via ORAL
  Filled 2020-04-03: qty 2

## 2020-04-03 MED ORDER — SODIUM CHLORIDE 0.9 % IV SOLN
INTRAVENOUS | Status: DC | PRN
  Administered 2020-04-03: 250 mL via INTRAVENOUS

## 2020-04-03 NOTE — Progress Notes (Addendum)
0900 Pt arrived at the [facility] [place]  [facility]) for her remicaide infusion. Pt A&Ox4, ambulatory on arrival. Standing scale weight obtained and documented. Pt assisted to recliner chair. IV inserted, and NS running at [facility]. VSS, [place] IV inserted as pt c/o pain in first IV site. Pt tolerated well. WCTM.   1062 Pt medicated per MAR with tylenol and benadryl. Awaiting med from pharmacy.   6948 Remicade started, WCTM closely.   1045 Pt tolerating, no s/s reaction, WCTM.   1130 Pt still tolerating med, no reaction noted, WCTM.   1205 Medicated done infusion. Pt declined to stay for observation of reaction. RN discharged pt, reviewed discharge packet and s/s of reaction. Pt understands without assistance. Pt A&Ox4, ambulatory at time of discharge from the [facility].

## 2020-04-03 NOTE — Discharge Instructions (Signed)
Infliximab injection What is this medicine? INFLIXIMAB (in [place] i mab) is used to treat Crohn's disease and ulcerative colitis. It is also used to treat ankylosing spondylitis, plaque psoriasis, and some forms of arthritis. This medicine may be used for other purposes; ask your health care provider or pharmacist if you have questions. COMMON BRAND NAME(S): AVSOLA, INFLECTRA, Remicade, RENFLEXIS What should I tell my health care provider before I take this medicine? They need to know if you have any of these conditions:  cancer  current or past resident of [place] or [place]  diabetes  exposure to tuberculosis  Guillain-Barre syndrome  heart failure  hepatitis or liver disease  immune system problems  infection  lung or breathing disease, like COPD  multiple sclerosis  receiving phototherapy for the skin  seizure disorder  an unusual or allergic reaction to infliximab, mouse proteins, other medicines, foods, dyes, or preservatives  pregnant or trying to get pregnant  breast-feeding How should I use this medicine? This medicine is for injection into a vein. It is usually given by a health care professional in a hospital or clinic setting. A special MedGuide will be given to you by the pharmacist with each prescription and refill. Be sure to read this information carefully each time. Talk to your pediatrician regarding the use of this medicine in children. While this drug may be prescribed for children as young as 41 years of age for selected conditions, precautions do apply. Overdosage: If you think you have taken too much of this medicine contact a poison control center or emergency room at once. NOTE: This medicine is only for you. Do not share this medicine with others. What if I miss a dose? It is important not to miss your dose. Call your doctor or health care professional if you are unable to keep an appointment. What may interact with this  medicine? Do not take this medicine with any of the following medications:  biologic medicines such as abatacept, adalimumab, anakinra, certolizumab, etanercept, golimumab, rituximab, secukinumab, tocilizumab, tofactinib, ustekinumab  live vaccines This list may not describe all possible interactions. Give your health care provider a list of all the medicines, herbs, non-prescription drugs, or dietary supplements you use. Also tell them if you smoke, drink alcohol, or use illegal drugs. Some items may interact with your medicine. What should I watch for while using this medicine? Your condition will be monitored carefully while you are receiving this medicine. Visit your doctor or health care professional for regular checks on your progress. You may need blood work done while you are taking this medicine. Before beginning therapy, your doctor may do a test to see if you have been exposed to tuberculosis. Call your doctor or health care professional for advice if you get a fever, chills or sore throat, or other symptoms of a cold or flu. Do not treat yourself. This drug decreases your body's ability to fight infections. Try to avoid being around people who are sick. This medicine may make the symptoms of heart failure worse in some patients. If you notice symptoms such as increased shortness of breath or swelling of the ankles or legs, contact your health care provider right away. If you are going to have surgery or dental work, tell your health care professional or dentist that you have received this medicine. If you take this medicine for plaque psoriasis, stay out of the sun. If you cannot avoid being in the sun, wear protective clothing and use sunscreen. Do  not use sun lamps or tanning beds/booths. Talk to your doctor about your risk of cancer. You may be more at risk for certain types of cancers if you take this medicine. What side effects may I notice from receiving this medicine? Side effects  that you should report to your doctor or health care professional as soon as possible:  allergic reactions like skin rash, itching or hives, swelling of the face, lips, or tongue  breathing problems  changes in vision  chest pain  fever or chills, usually related to the infusion  joint pain  pain, tingling, numbness in the hands or feet  redness, blistering, peeling or loosening of the skin, including inside the mouth  seizures  signs of infection - fever or chills, cough, sore throat, flu-like symptoms, pain or difficulty passing urine  signs and symptoms of liver injury like dark yellow or brown urine; general ill feeling; light-colored stools; loss of appetite; nausea; right upper belly pain; unusually weak or tired; yellowing of the eyes or skin  signs and symptoms of a stroke like changes in vision; confusion; trouble speaking or understanding; severe headaches; sudden numbness or weakness of the face, arm or leg; trouble walking; dizziness; loss of balance or coordination  swelling of the ankles, feet, or hands  swollen lymph nodes in the neck, underarm, or groin areas  unusual bleeding or bruising  unusually weak or tired Side effects that usually do not require medical attention (report to your doctor or health care professional if they continue or are bothersome):  headache  nausea  stomach pain  upset stomach This list may not describe all possible side effects. Call your doctor for medical advice about side effects. You may report side effects to FDA at 1-800-FDA-1088. Where should I keep my medicine? This drug is given in a hospital or clinic and will not be stored at home. NOTE: This sheet is a summary. It may not cover all possible information. If you have questions about this medicine, talk to your doctor, pharmacist, or health care provider.  2020 Elsevier/Gold Standard (2016-11-25 13:45:32)

## 2020-04-05 ENCOUNTER — Encounter: Payer: Self-pay | Admitting: Emergency Medicine

## 2020-04-05 ENCOUNTER — Telehealth: Payer: Self-pay | Admitting: Gastroenterology

## 2020-04-05 ENCOUNTER — Other Ambulatory Visit: Payer: Self-pay

## 2020-04-05 ENCOUNTER — Ambulatory Visit
Admission: EM | Admit: 2020-04-05 | Discharge: 2020-04-05 | Disposition: A | Discharge status: Home / Self Care | Payer: Medicaid Other | Attending: Physician Assistant | Admitting: Physician Assistant

## 2020-04-05 DIAGNOSIS — H6591 Unspecified nonsuppurative otitis media, right ear: Secondary | ICD-10-CM

## 2020-04-05 DIAGNOSIS — H9201 Otalgia, right ear: Secondary | ICD-10-CM

## 2020-04-05 MED ORDER — FLUTICASONE PROPIONATE 50 MCG/ACT NA SUSP
2.0000 | Freq: Every day | NASAL | 0 refills | Status: DC
Start: 2020-04-05 — End: 2020-09-10

## 2020-04-05 MED ORDER — OFLOXACIN 0.3 % OT SOLN: 10.0000 [drp] | Freq: Every day | OTIC | 0 refills | Status: AC

## 2020-04-05 NOTE — ED Provider Notes (Signed)
[name]    [name]: [id number] Arrival date & time: 04/05/20  1726      History   Chief Complaint Chief Complaint  Patient presents with  . Otalgia    HPI Erika Stephens is a 29 y.o. female.   29 year old female comes in for right ear pain. States pain first started to the left ear, applied sweet oil and left over antibiotic ear drop with good relief. However, symptoms started in the right ear a few days ago, and tried same regimen without relief. Muffled hearing. Denies URI symptoms, fever. Uses cotton swabs.      Past Medical History:  Diagnosis Date  . Crohn's colitis [facility])     Patient Active Problem List   Diagnosis Date Noted  . Crohn's disease of small and large intestines with complication ([place]) 65/46/5035  . History of iron deficiency anemia 03/08/2020  . Infliximab (Remicade) long-term use 03/08/2020  . History of immunosuppression therapy 03/08/2020  . Generalized abdominal pain 03/08/2020  . Bloating symptom 03/08/2020  . Chronic diarrhea 03/08/2020    Past Surgical History:  Procedure Laterality Date  . APPENDECTOMY    . BOWEL RESECTION      OB History   No obstetric history on file.      Home Medications    Prior to Admission medications   Medication Sig Start Date End Date Taking? Authorizing Provider  fluticasone (FLONASE) 50 MCG/ACT nasal spray Place 2 sprays into both nostrils daily. 04/05/20   Tasia Catchings, Jamarien Rodkey V, PA-C  ketoconazole (NIZORAL) 2 % shampoo Apply 1 application topically 2 (two) times a week. 01/29/20   Mitchell Iwanicki V, PA-C  Na Sulfate-K Sulfate-Mg Sulf (SUPREP BOWEL PREP KIT) 17.5-3.13-1.6 GM/177ML SOLN Take 1 kit by mouth as directed. For colonoscopy prep 03/07/20   Mansouraty, Telford Nab., MD  ofloxacin (FLOXIN) 0.3 % OTIC solution Place 10 drops into the right ear daily for 7 days. 04/05/20 04/12/20  Ok Edwards, PA-C    Family History Family History  Problem Relation Age of Onset  . Crohn's disease Mother   . Schizophrenia  Father   . Bipolar disorder Father   . Crohn's disease Maternal Grandmother   . Prostate cancer Maternal Uncle   . Colon cancer Neg Hx   . Stomach cancer Neg Hx   . Pancreatic cancer Neg Hx   . Esophageal cancer Neg Hx   . Inflammatory bowel disease Neg Hx   . Liver disease Neg Hx   . Rectal cancer Neg Hx     Social History Social History   Tobacco Use  . Smoking status: Never Smoker  . Smokeless tobacco: Never Used  Substance Use Topics  . Alcohol use: Yes    Comment: socially.   . Drug use: Yes    Types: Marijuana    Comment: few times a week      Allergies   Aspirin   Review of Systems Review of Systems  Reason unable to perform ROS: See HPI as above.     Physical Exam Triage Vital Signs ED Triage Vitals [04/05/20 1741]  Enc Vitals Group     BP 122/60     Pulse Rate 82     Resp 18     Temp 99 F (37.2 C)     Temp Source Oral     SpO2 97 %     Weight      Height      Head Circumference      Peak  Flow      Pain Score 6     Pain Loc      Pain Edu?      Excl. in [place]?    No data found.  Updated Vital Signs BP 122/60 (BP Location: Right Arm)   Pulse 82   Temp 99 F (37.2 C) (Oral)   Resp 18   SpO2 97%   Visual Acuity Right Eye Distance:   Left Eye Distance:   Bilateral Distance:    Right Eye Near:   Left Eye Near:    Bilateral Near:     Physical Exam Constitutional:      General: She is not in acute distress.    Appearance: Normal appearance. She is well-developed. She is not toxic-appearing or diaphoretic.  HENT:     Head: Normocephalic and atraumatic.     Left Ear: Tympanic membrane, ear canal and external ear normal.  No middle ear effusion. Tympanic membrane is not erythematous or bulging.     Ears:     Comments: Right tragus tender to palpation. Ear canal erythematous without swelling, discharge. Mid ear effusion to the right TM. No bulging, erythema.  Eyes:     Conjunctiva/sclera: Conjunctivae normal.     Pupils: Pupils are  equal, round, and reactive to light.  Pulmonary:     Effort: Pulmonary effort is normal. No respiratory distress.     Comments: Speaking in full sentences without difficulty Musculoskeletal:     Cervical back: Normal range of motion and neck supple.  Skin:    General: Skin is warm and dry.  Neurological:     Mental Status: She is alert and oriented to person, place, and time.      UC Treatments / Results  Labs (all labs ordered are listed, but only abnormal results are displayed) Labs Reviewed - No data to display  EKG   Radiology No results found.  Procedures Procedures (including critical care time)  Medications Ordered in UC Medications - No data to display  Initial Impression / Assessment and Plan / UC Course  I have reviewed the triage vital signs and the nursing notes.  Pertinent labs & imaging results that were available during my care of the patient were reviewed by me and considered in my medical decision making (see chart for details).    Discussed middle ear effusion could be causing ear pain, start Flonase as directed.  Right ear canal erythematous without swelling or discharge ?  Otitis externa.  Given painful tragus, will start ofloxacin as directed.  Return precautions given.  Final Clinical Impressions(s) / UC Diagnoses   Final diagnoses:  Right ear pain  Fluid level behind tympanic membrane of right ear    ED Prescriptions    Medication Sig Dispense Auth. Provider   ofloxacin (FLOXIN) 0.3 % OTIC solution Place 10 drops into the right ear daily for 7 days. 3.5 mL Curtistine Pettitt V, PA-C   fluticasone (FLONASE) 50 MCG/ACT nasal spray Place 2 sprays into both nostrils daily. 1 g Ok Edwards, PA-C     PDMP not reviewed this encounter.   Ok Edwards, PA-C 04/05/20 1825

## 2020-04-05 NOTE — Telephone Encounter (Signed)
Pt stated that she has been off of Remicade due to insurance.  She just had her first dose 04/03/20.  She stated that her next dose is due in 8 weeks and would like to discuss.

## 2020-04-05 NOTE — Telephone Encounter (Signed)
The pt has been advised that Dr Rush Landmark did confirm that she is due every 8 weeks.  If she has any problems prior to the next appt she should call our office.  The pt has been advised of the information and verbalized understanding.

## 2020-04-05 NOTE — Discharge Instructions (Signed)
Start flonase for eustachian tube dysfunction. As discussed, ear canal with irritation that may be  causing symptoms as well. Start ofloxacin ear drop as directed. Follow up with PCP if symptoms not improving.

## 2020-04-05 NOTE — ED Triage Notes (Signed)
Pt here for bilateral ear pain x 3 days

## 2020-04-18 DIAGNOSIS — K5090 Crohn's disease, unspecified, without complications: Secondary | ICD-10-CM | POA: Diagnosis not present

## 2020-04-19 ENCOUNTER — Ambulatory Visit (AMBULATORY_SURGERY_CENTER): Payer: Medicaid Other | Admitting: Gastroenterology

## 2020-04-19 ENCOUNTER — Other Ambulatory Visit: Payer: Self-pay

## 2020-04-19 ENCOUNTER — Encounter: Payer: Self-pay | Admitting: Gastroenterology

## 2020-04-19 ENCOUNTER — Telehealth: Payer: Self-pay | Admitting: Gastroenterology

## 2020-04-19 VITALS — BP 117/82 | HR 61 | Temp 97.5°F | Resp 12 | Ht 61.0 in | Wt 137.0 lb

## 2020-04-19 DIAGNOSIS — K5010 Crohn's disease of large intestine without complications: Secondary | ICD-10-CM

## 2020-04-19 DIAGNOSIS — R140 Abdominal distension (gaseous): Secondary | ICD-10-CM | POA: Diagnosis not present

## 2020-04-19 DIAGNOSIS — K2950 Unspecified chronic gastritis without bleeding: Secondary | ICD-10-CM | POA: Diagnosis not present

## 2020-04-19 DIAGNOSIS — R197 Diarrhea, unspecified: Secondary | ICD-10-CM | POA: Diagnosis not present

## 2020-04-19 MED ORDER — SODIUM CHLORIDE 0.9 % IV SOLN: 500.0000 mL | Freq: Once | INTRAVENOUS | Status: DC

## 2020-04-19 MED ORDER — OMEPRAZOLE 40 MG PO CPDR: 40.0000 mg | DELAYED_RELEASE_CAPSULE | Freq: Every day | ORAL | 3 refills | Status: DC

## 2020-04-19 NOTE — Patient Instructions (Signed)
HANDOUTS PROVIDED ON: GASTRITS  The biopsies taken today have been sent for pathology.  The results can take 1-3 weeks to receive.    You may resume your previous diet and medication schedule.  A prescription for Omeprazole (Prilosec) 40 mg daily before breakfast has been sent to your pharmacy.  Thank you for allowing [place] to care for you today!!!   YOU HAD AN ENDOSCOPIC PROCEDURE TODAY AT [place]:   Refer to the procedure report that was given to you for any specific questions about what was found during the examination.  If the procedure report does not answer your questions, please call your gastroenterologist to clarify.  If you requested that your care partner not be given the details of your procedure findings, then the procedure report has been included in a sealed envelope for you to review at your convenience later.  YOU SHOULD EXPECT: Some feelings of bloating in the abdomen. Passage of more gas than usual.  Walking can help get rid of the air that was put into your GI tract during the procedure and reduce the bloating. If you had a lower endoscopy (such as a colonoscopy or flexible sigmoidoscopy) you may notice spotting of blood in your stool or on the toilet paper. If you underwent a bowel prep for your procedure, you may not have a normal bowel movement for a few days.  Please Note:  You might notice some irritation and congestion in your nose or some drainage.  This is from the oxygen used during your procedure.  There is no need for concern and it should clear up in a day or so.  SYMPTOMS TO REPORT IMMEDIATELY:   Following lower endoscopy (colonoscopy or flexible sigmoidoscopy):  Excessive amounts of blood in the stool  Significant tenderness or worsening of abdominal pains  Swelling of the abdomen that is new, acute  Fever of 100F or higher   Following upper endoscopy (EGD)  Vomiting of blood or coffee ground material  New chest pain or pain under the  shoulder blades  Painful or persistently difficult swallowing  New shortness of breath  Fever of 100F or higher  Black, tarry-looking stools  For urgent or emergent issues, a gastroenterologist can be reached at any hour by calling [phone]. Do not use MyChart messaging for urgent concerns.    DIET:  We do recommend a small meal at first, but then you may proceed to your regular diet.  Drink plenty of fluids but you should avoid alcoholic beverages for 24 hours.  ACTIVITY:  You should plan to take it easy for the rest of today and you should NOT DRIVE or use heavy machinery until tomorrow (because of the sedation medicines used during the test).    FOLLOW UP: Our staff will call the number listed on your records 48-72 hours following your procedure to check on you and address any questions or concerns that you may have regarding the information given to you following your procedure. If we do not reach you, we will leave a message.  We will attempt to reach you two times.  During this call, we will ask if you have developed any symptoms of COVID 19. If you develop any symptoms (ie: fever, flu-like symptoms, shortness of breath, cough etc.) before then, please call [phone].  If you test positive for Covid 19 in the 2 weeks post procedure, please call and report this information to [place].    If any biopsies were taken you  will be contacted by phone or by letter within the next 1-3 weeks.  Please call us at [phone] if you have not heard about the biopsies in 3 weeks.    SIGNATURES/CONFIDENTIALITY: You and/or your care partner have signed paperwork which will be entered into your electronic medical record.  These signatures attest to the fact that that the information above on your After Visit Summary has been reviewed and is understood.  Full responsibility of the confidentiality of this discharge information lies with you and/or your care-partner.

## 2020-04-19 NOTE — Op Note (Signed)
[place] Patient Name: Erika Stephens Procedure Date: 04/19/2020 1:31 PM MRN: [id number] Endoscopist: Justice Britain , MD Age: 29 Referring MD:  Date of Birth: 24-Jul-1991 Gender: Female Account #: [id number] Procedure:                Colonoscopy Indications:              Personal history of Crohn's disease - Off Remicade                            >6 months prior to recent re-initiation (was having                            increased bowel habits/diarrhea and also some                            psoriatic-like plaques) Medicines:                Monitored Anesthesia Care Procedure:                Pre-Anesthesia Assessment:                           - Prior to the procedure, a History and Physical                            was performed, and patient medications and                            allergies were reviewed. The patient's tolerance of                            previous anesthesia was also reviewed. The risks                            and benefits of the procedure and the sedation                            options and risks were discussed with the patient.                            All questions were answered, and informed consent                            was obtained. Prior Anticoagulants: The patient has                            taken no previous anticoagulant or antiplatelet                            agents. ASA Grade Assessment: II - A patient with                            mild systemic disease. After reviewing the risks  and benefits, the patient was deemed in                            satisfactory condition to undergo the procedure.                           After obtaining informed consent, the colonoscope                            was passed under direct vision. Throughout the                            procedure, the patient's blood pressure, pulse, and                            oxygen saturations were monitored  continuously. The                            Colonoscope was introduced through the anus and                            advanced to the the terminal ileum. The colonoscopy                            was performed without difficulty. The patient                            tolerated the procedure. The quality of the bowel                            preparation was adequate. The terminal ileum,                            ileocecal valve, appendiceal orifice, and rectum                            and the rectum were photographed. Scope In: 1:44:23 PM Scope Out: 2:03:51 PM Scope Withdrawal Time: 0 hours 16 minutes 28 seconds  Total Procedure Duration: 0 hours 19 minutes 28 seconds  Findings:                 The digital rectal exam findings include                            hemorrhoids. Pertinent negatives include no                            palpable rectal lesions.                           A moderate amount of semi-liquid stool was found in                            the entire colon, interfering with visualization.  Lavage of the area was performed using copious                            amounts, resulting in clearance with adequate                            visualization.                           There was evidence of a prior functional end-to-end                            ileo-colonic anastomosis in the ascending colon.                            This was patent and was characterized by healthy                            appearing mucosa. The anastomosis was traversed.                           The neo-terminal ileum appeared normal. Biopsies                            were taken with a cold forceps for histology.                           Normal mucosa was found in the entire colon.                            Biopsies were taken with a cold forceps for                            histology from the remaining right colon. Biopsies                             were taken with a cold forceps for histology from                            the left colon. Biopsies were taken with a cold                            forceps for histology from the rectum.                           Non-bleeding non-thrombosed internal hemorrhoids                            were found during retroflexion, during perianal                            exam and during digital exam. The hemorrhoids were  Grade II (internal hemorrhoids that prolapse but                            reduce spontaneously). Complications:            No immediate complications. Estimated Blood Loss:     Estimated blood loss was minimal. Impression:               - Hemorrhoids found on digital rectal exam.                           - Stool in the entire examined colon. Lavaged with                            adequate visualization.                           - Patent functional end-to-end ileo-colonic                            anastomosis, characterized by healthy appearing                            mucosa.                           - The examined portion of the ileum was normal.                            Biopsied.                           - Normal mucosa in the entire examined colon.                            Biopsied for IBD surveillance.                           - Non-bleeding non-thrombosed internal hemorrhoids. Recommendation:           - The patient will be observed post-procedure,                            until all discharge criteria are met.                           - Discharge patient to home.                           - Patient has a contact number available for                            emergencies. The signs and symptoms of potential                            delayed complications were discussed with the  patient. Return to normal activities tomorrow.                            Written discharge instructions were provided to the                             patient.                           - Resume previous diet.                           - Continue present medications.                           - Continue current Remicade dosing.                           - Await pathology results.                           - Repeat colonoscopy in 1 year for surveillance.                           - Patient has had Iron Deficiency as well. Will                            need repeat CBC/Iron/TIBC/Ferritin to be obtained                            in 2-weeks. She is currently only on MVI + Iron                            rather than Iron pill. We can see how things stand,                            but if she remains IDA, then will need Oral Iron                            pill and then after 6-weeks recheck to see if she                            may benefit from IV Iron infusion.                           - The findings and recommendations were discussed                            with the patient. Justice Britain, MD 04/19/2020 2:16:58 PM

## 2020-04-19 NOTE — Progress Notes (Signed)
[name]  Pt states that her last stools were clear, yellow liquid- no solid stool.  I relayed this to Dr. Rush Landmark to see if he wants the pt to have an enema and he said ok to proceed if pt states stools are as they are.

## 2020-04-19 NOTE — Progress Notes (Signed)
Called to room to assist during endoscopic procedure.  Patient ID and intended procedure confirmed with present staff. Received instructions for my participation in the procedure from the performing physician.  

## 2020-04-19 NOTE — Op Note (Signed)
[facility] Patient Name: Erika Stephens Procedure Date: 04/19/2020 1:32 PM MRN: [id number] Endoscopist: Justice Britain , MD Age: 29 Referring MD:  Date of Birth: May 04, 1991 Gender: Female Account #: [id number] Procedure:                Upper GI endoscopy Indications:              Generalized abdominal pain, Crohn's disease,                            Abdominal bloating, Diarrhea Medicines:                Monitored Anesthesia Care Procedure:                Pre-Anesthesia Assessment:                           - Prior to the procedure, a History and Physical                            was performed, and patient medications and                            allergies were reviewed. The patient's tolerance of                            previous anesthesia was also reviewed. The risks                            and benefits of the procedure and the sedation                            options and risks were discussed with the patient.                            All questions were answered, and informed consent                            was obtained. Prior Anticoagulants: The patient has                            taken no previous anticoagulant or antiplatelet                            agents. ASA Grade Assessment: II - A patient with                            mild systemic disease. After reviewing the risks                            and benefits, the patient was deemed in                            satisfactory condition to undergo the procedure.  After obtaining informed consent, the endoscope was                            passed under direct vision. Throughout the                            procedure, the patient's blood pressure, pulse, and                            oxygen saturations were monitored continuously. The                            Endoscope was introduced through the mouth, and                            advanced to the second part of  duodenum. The upper                            GI endoscopy was accomplished without difficulty.                            The patient tolerated the procedure. Scope In: Scope Out: Findings:                 No gross lesions were noted in the entire esophagus.                           The Z-line was regular and was found 37 cm from the                            incisors.                           Patchy mildly erythematous mucosa without bleeding                            was found in the gastric fundus and in the gastric                            body.                           No other gross lesions were noted in the entire                            examined stomach. Biopsies were taken with a cold                            forceps for histology and Helicobacter pylori                            testing.                           No gross lesions were noted in the duodenal  bulb,                            in the first portion of the duodenum and in the                            second portion of the duodenum. Biopsies were taken                            with a cold forceps for histology. Complications:            No immediate complications. Estimated Blood Loss:     Estimated blood loss was minimal. Impression:               - No gross lesions in esophagus. Z-line regular, 37                            cm from the incisors.                           - Erythematous mucosa in the gastric fundus and                            gastric body. No other gross lesions in the                            stomach. Biopsied.                           - No gross lesions in the duodenal bulb, in the                            first portion of the duodenum and in the second                            portion of the duodenum. Biopsied. Recommendation:           - Proceed to scheduled colonoscopy.                           - Await pathology results.                           - Observe  patient's clinical course.                           - Start Omeprazole 40 mg daily.                           - The findings and recommendations were discussed                            with the patient. Justice Britain, MD 04/19/2020 2:10:35 PM

## 2020-04-19 NOTE — Telephone Encounter (Signed)
Patient is scheduled to see Dr. Rush Landmark today 1:30pm. Pt called with concerns about prep and recent bowel movements. She stated that she threw up half of her prep this morning, but was able to complete the other half and the additional water. She stated that one min her bowel movement was cloudy brown, and the next minute it was clear. Spoke with Dr. Rush Landmark, and he advised that we could do an enema upon arrival to make sure pt is cleaned out.

## 2020-04-19 NOTE — Progress Notes (Signed)
PT taken to PACU. Monitors in place. VSS. Report given to RN.  Propofol given at beginning and throughout procedure to maintain safe and appropriate comfort level. VO/MDHebert Soho, CRNA

## 2020-04-22 ENCOUNTER — Other Ambulatory Visit: Payer: Self-pay

## 2020-04-22 DIAGNOSIS — Z862 Personal history of diseases of the blood and blood-forming organs and certain disorders involving the immune mechanism: Secondary | ICD-10-CM

## 2020-04-23 ENCOUNTER — Telehealth: Payer: Self-pay

## 2020-04-23 NOTE — Telephone Encounter (Signed)
°  Follow up Call-  Call back number 04/19/2020  Post procedure Call Back phone  # [phone]  Permission to leave phone message Yes     Patient questions:  Do you have a fever, pain , or abdominal swelling? No. Pain Score  0 *  Have you tolerated food without any problems? Yes.    Have you been able to return to your normal activities? Yes.    Do you have any questions about your discharge instructions: Diet   No. Medications  No. Follow up visit  No.  Do you have questions or concerns about your Care? No.  Actions: * If pain score is 4 or above: No action needed, pain <4.  1. Have you developed a fever since your procedure? no  2.   Have you had an respiratory symptoms (SOB or cough) since your procedure? no  3.   Have you tested positive for COVID 19 since your procedure no  4.   Have you had any family members/close contacts diagnosed with the COVID 19 since your procedure?  no   If yes to any of these questions please route to Joylene John, RN and Erenest Rasher, RN

## 2020-04-23 NOTE — Telephone Encounter (Signed)
Call got disconnected before any questions answered. Call went straight to voicemail when trying to call back. Voicemail full.

## 2020-04-25 ENCOUNTER — Encounter: Payer: Self-pay | Admitting: Gastroenterology

## 2020-04-30 DIAGNOSIS — L739 Follicular disorder, unspecified: Secondary | ICD-10-CM | POA: Diagnosis not present

## 2020-04-30 DIAGNOSIS — L810 Postinflammatory hyperpigmentation: Secondary | ICD-10-CM | POA: Diagnosis not present

## 2020-05-04 ENCOUNTER — Ambulatory Visit
Admission: EM | Admit: 2020-05-04 | Discharge: 2020-05-04 | Disposition: A | Discharge status: Home / Self Care | Payer: Medicaid Other | Attending: Physician Assistant | Admitting: Physician Assistant

## 2020-05-04 ENCOUNTER — Other Ambulatory Visit: Payer: Self-pay

## 2020-05-04 DIAGNOSIS — H66001 Acute suppurative otitis media without spontaneous rupture of ear drum, right ear: Secondary | ICD-10-CM

## 2020-05-04 DIAGNOSIS — H6983 Other specified disorders of Eustachian tube, bilateral: Secondary | ICD-10-CM

## 2020-05-04 MED ORDER — AZELASTINE HCL 0.1 % NA SOLN
2.0000 | Freq: Two times a day (BID) | NASAL | 0 refills | Status: DC
Start: 2020-05-04 — End: 2020-09-10

## 2020-05-04 MED ORDER — AMOXICILLIN 875 MG PO TABS
875.0000 mg | ORAL_TABLET | Freq: Two times a day (BID) | ORAL | 0 refills | Status: DC
Start: 2020-05-04 — End: 2020-09-10

## 2020-05-04 NOTE — Discharge Instructions (Signed)
Start amoxicillin as directed. Continue flonase, add azelastine. Follow up with ENT if symptoms not improving.

## 2020-05-04 NOTE — ED Provider Notes (Signed)
[name]    [name]: [id number] Arrival date & time: 05/04/20  1024      History   Chief Complaint Chief Complaint  Patient presents with  . Otalgia    HPI Erika Stephens is a 29 y.o. female.   29 year old female comes in for right ear pain that started yesterday. Pain started after cleaning off the deck. Has baseline allergic rhinitis, uses flonase daily, no significant changes. No injury/trauma to the ears. No drainage.      Past Medical History:  Diagnosis Date  . Anemia   . Anxiety   . Blood transfusion without reported diagnosis   . Crohn's colitis ([place])   . Scalp psoriasis     Patient Active Problem List   Diagnosis Date Noted  . Crohn's disease of small and large intestines with complication ([place]) 85/27/7824  . History of iron deficiency anemia 03/08/2020  . Infliximab (Remicade) long-term use 03/08/2020  . History of immunosuppression therapy 03/08/2020  . Generalized abdominal pain 03/08/2020  . Bloating symptom 03/08/2020  . Chronic diarrhea 03/08/2020    Past Surgical History:  Procedure Laterality Date  . APPENDECTOMY    . BOWEL RESECTION    . COLONOSCOPY    . UPPER GASTROINTESTINAL ENDOSCOPY      OB History   No obstetric history on file.      Home Medications    Prior to Admission medications   Medication Sig Start Date End Date Taking? Authorizing Provider  amoxicillin (AMOXIL) 875 MG tablet Take 1 tablet (875 mg total) by mouth 2 (two) times daily. 05/04/20   Tasia Catchings, Doyel Mulkern V, PA-C  azelastine (ASTELIN) 0.1 % nasal spray Place 2 sprays into both nostrils 2 (two) times daily. 05/04/20   Tasia Catchings, Lion Fernandez V, PA-C  fluticasone (FLONASE) 50 MCG/ACT nasal spray Place 2 sprays into both nostrils daily. 04/05/20   Haley Fuerstenberg V, PA-C  inFLIXimab (REMICADE IV) Inject into the vein. Every 8 weeks    [provider]  Multiple Vitamin (MULTIVITAMIN ADULT PO) Take by mouth daily.    [provider]  omeprazole (PRILOSEC) 40 MG capsule Take 1  capsule (40 mg total) by mouth daily. 04/19/20   Mansouraty, Telford Nab., MD    Family History Family History  Problem Relation Age of Onset  . Crohn's disease Mother   . Schizophrenia Father   . Bipolar disorder Father   . Crohn's disease Maternal Grandmother   . Prostate cancer Maternal Uncle   . Colon cancer Neg Hx   . Stomach cancer Neg Hx   . Pancreatic cancer Neg Hx   . Esophageal cancer Neg Hx   . Inflammatory bowel disease Neg Hx   . Liver disease Neg Hx   . Rectal cancer Neg Hx     Social History Social History   Tobacco Use  . Smoking status: Never Smoker  . Smokeless tobacco: Never Used  Vaping Use  . Vaping Use: Never used  Substance Use Topics  . Alcohol use: Yes    Comment: socially.   . Drug use: Yes    Types: Marijuana    Comment: last used 04-17-20 per pt     Allergies   Adalimumab and Aspirin   Review of Systems Review of Systems  Reason unable to perform ROS: See HPI as above.     Physical Exam Triage Vital Signs ED Triage Vitals  Enc Vitals Group     BP 05/04/20 1031 (!) 101/56     Pulse Rate  05/04/20 1031 77     Resp 05/04/20 1031 16     Temp 05/04/20 1031 98.1 F (36.7 C)     Temp Source 05/04/20 1031 Oral     SpO2 05/04/20 1031 98 %     Weight --      Height --      Head Circumference --      Peak Flow --      Pain Score 05/04/20 1041 9     Pain Loc --      Pain Edu? --      Excl. in [place]? --    No data found.  Updated Vital Signs BP (!) 101/56 (BP Location: Left Arm)   Pulse 77   Temp 98.1 F (36.7 C) (Oral)   Resp 16   LMP 04/15/2020   SpO2 98%   Visual Acuity Right Eye Distance:   Left Eye Distance:   Bilateral Distance:    Right Eye Near:   Left Eye Near:    Bilateral Near:     Physical Exam Constitutional:      General: She is not in acute distress.    Appearance: Normal appearance. She is well-developed. She is not toxic-appearing or diaphoretic.  HENT:     Head: Normocephalic and atraumatic.      Right Ear: Ear canal and external ear normal. A middle ear effusion is present. Tympanic membrane is erythematous. Tympanic membrane is not bulging.     Left Ear: Ear canal and external ear normal. A middle ear effusion is present.  Eyes:     Conjunctiva/sclera: Conjunctivae normal.     Pupils: Pupils are equal, round, and reactive to light.  Pulmonary:     Effort: Pulmonary effort is normal. No respiratory distress.     Comments: Speaking in full sentences without difficulty Musculoskeletal:     Cervical back: Normal range of motion and neck supple.  Skin:    General: Skin is warm and dry.  Neurological:     Mental Status: She is alert and oriented to person, place, and time.      UC Treatments / Results  Labs (all labs ordered are listed, but only abnormal results are displayed) Labs Reviewed - No data to display  EKG   Radiology No results found.  Procedures Procedures (including critical care time)  Medications Ordered in UC Medications - No data to display  Initial Impression / Assessment and Plan / UC Course  I have reviewed the triage vital signs and the nursing notes.  Pertinent labs & imaging results that were available during my care of the patient were reviewed by me and considered in my medical decision making (see chart for details).    Will provide amoxicillin for otitis media. Will continue flonase and add azelastine. Follow up with ENT if symptoms not improving. Resources provided. Return precautions given.  Final Clinical Impressions(s) / UC Diagnoses   Final diagnoses:  Non-recurrent acute suppurative otitis media of right ear without spontaneous rupture of tympanic membrane  Acute dysfunction of both eustachian tubes    ED Prescriptions    Medication Sig Dispense Auth. Provider   amoxicillin (AMOXIL) 875 MG tablet Take 1 tablet (875 mg total) by mouth 2 (two) times daily. 14 tablet Essica Kiker V, PA-C   azelastine (ASTELIN) 0.1 % nasal spray Place 2  sprays into both nostrils 2 (two) times daily. 30 mL Ok Edwards, PA-C     PDMP not reviewed this encounter.   Tasia Catchings, Jewett Mcgann  V, PA-C 05/04/20 1108

## 2020-05-04 NOTE — ED Triage Notes (Signed)
Pt presents for assessment of right ear pain that started yesterday.

## 2020-05-06 DIAGNOSIS — H60331 Swimmer's ear, right ear: Secondary | ICD-10-CM | POA: Diagnosis not present

## 2020-05-08 DIAGNOSIS — H60331 Swimmer's ear, right ear: Secondary | ICD-10-CM | POA: Diagnosis not present

## 2020-05-16 DIAGNOSIS — H60331 Swimmer's ear, right ear: Secondary | ICD-10-CM | POA: Diagnosis not present

## 2020-05-29 ENCOUNTER — Other Ambulatory Visit: Payer: Self-pay

## 2020-05-29 ENCOUNTER — Ambulatory Visit (HOSPITAL_COMMUNITY)
Admission: RE | Admit: 2020-05-29 | Discharge: 2020-05-29 | Disposition: A | Discharge status: Home / Self Care | Payer: Medicaid Other | Source: Ambulatory Visit | Attending: Internal Medicine | Admitting: Internal Medicine

## 2020-05-29 DIAGNOSIS — K50819 Crohn's disease of both small and large intestine with unspecified complications: Secondary | ICD-10-CM | POA: Diagnosis not present

## 2020-05-29 MED ORDER — SODIUM CHLORIDE 0.9 % IV SOLN
600.0000 mg | INTRAVENOUS | Status: DC
  Administered 2020-05-29: 600 mg via INTRAVENOUS
  Filled 2020-05-29: qty 60

## 2020-05-29 MED ORDER — ACETAMINOPHEN 325 MG PO TABS
650.0000 mg | ORAL_TABLET | ORAL | Status: DC
  Administered 2020-05-29: 650 mg via ORAL
  Filled 2020-05-29: qty 2

## 2020-05-29 MED ORDER — DIPHENHYDRAMINE HCL 25 MG PO CAPS
25.0000 mg | ORAL_CAPSULE | ORAL | Status: DC
  Administered 2020-05-29: 25 mg via ORAL
  Filled 2020-05-29: qty 1

## 2020-05-29 MED ORDER — SODIUM CHLORIDE 0.9 % IV SOLN
INTRAVENOUS | Status: DC | PRN
  Administered 2020-05-29: 250 mL via INTRAVENOUS

## 2020-05-29 NOTE — Progress Notes (Signed)
Pt came in for Remicaide infusion at the [facility].  IV was placed. VS were WNL. Pt was premedicated with Tylenol and Benedryl.  Pt tolerated infusion well.  VS remained stable at time of discharge.  Pt declined to stay for observation post infusion.  Discharge packet given.  Pt verbalized understanding.  Pt was ambulatory at time of discharge.

## 2020-05-29 NOTE — Discharge Instructions (Signed)
Infliximab injection What is this medicine? INFLIXIMAB (in [place] i mab) is used to treat Crohn's disease and ulcerative colitis. It is also used to treat ankylosing spondylitis, plaque psoriasis, and some forms of arthritis. This medicine may be used for other purposes; ask your health care provider or pharmacist if you have questions. COMMON BRAND NAME(S): AVSOLA, INFLECTRA, Remicade, RENFLEXIS What should I tell my health care provider before I take this medicine? They need to know if you have any of these conditions:  cancer  current or past resident of [place] or [place]  diabetes  exposure to tuberculosis  Guillain-Barre syndrome  heart failure  hepatitis or liver disease  immune system problems  infection  lung or breathing disease, like COPD  multiple sclerosis  receiving phototherapy for the skin  seizure disorder  an unusual or allergic reaction to infliximab, mouse proteins, other medicines, foods, dyes, or preservatives  pregnant or trying to get pregnant  breast-feeding How should I use this medicine? This medicine is for injection into a vein. It is usually given by a health care professional in a hospital or clinic setting. A special MedGuide will be given to you by the pharmacist with each prescription and refill. Be sure to read this information carefully each time. Talk to your pediatrician regarding the use of this medicine in children. While this drug may be prescribed for children as young as 71 years of age for selected conditions, precautions do apply. Overdosage: If you think you have taken too much of this medicine contact a poison control center or emergency room at once. NOTE: This medicine is only for you. Do not share this medicine with others. What if I miss a dose? It is important not to miss your dose. Call your doctor or health care professional if you are unable to keep an appointment. What may interact with this  medicine? Do not take this medicine with any of the following medications:  biologic medicines such as abatacept, adalimumab, anakinra, certolizumab, etanercept, golimumab, rituximab, secukinumab, tocilizumab, tofactinib, ustekinumab  live vaccines This list may not describe all possible interactions. Give your health care provider a list of all the medicines, herbs, non-prescription drugs, or dietary supplements you use. Also tell them if you smoke, drink alcohol, or use illegal drugs. Some items may interact with your medicine. What should I watch for while using this medicine? Your condition will be monitored carefully while you are receiving this medicine. Visit your doctor or health care professional for regular checks on your progress. You may need blood work done while you are taking this medicine. Before beginning therapy, your doctor may do a test to see if you have been exposed to tuberculosis. Call your doctor or health care professional for advice if you get a fever, chills or sore throat, or other symptoms of a cold or flu. Do not treat yourself. This drug decreases your body's ability to fight infections. Try to avoid being around people who are sick. This medicine may make the symptoms of heart failure worse in some patients. If you notice symptoms such as increased shortness of breath or swelling of the ankles or legs, contact your health care provider right away. If you are going to have surgery or dental work, tell your health care professional or dentist that you have received this medicine. If you take this medicine for plaque psoriasis, stay out of the sun. If you cannot avoid being in the sun, wear protective clothing and use sunscreen. Do  not use sun lamps or tanning beds/booths. Talk to your doctor about your risk of cancer. You may be more at risk for certain types of cancers if you take this medicine. What side effects may I notice from receiving this medicine? Side effects  that you should report to your doctor or health care professional as soon as possible:  allergic reactions like skin rash, itching or hives, swelling of the face, lips, or tongue  breathing problems  changes in vision  chest pain  fever or chills, usually related to the infusion  joint pain  pain, tingling, numbness in the hands or feet  redness, blistering, peeling or loosening of the skin, including inside the mouth  seizures  signs of infection - fever or chills, cough, sore throat, flu-like symptoms, pain or difficulty passing urine  signs and symptoms of liver injury like dark yellow or brown urine; general ill feeling; light-colored stools; loss of appetite; nausea; right upper belly pain; unusually weak or tired; yellowing of the eyes or skin  signs and symptoms of a stroke like changes in vision; confusion; trouble speaking or understanding; severe headaches; sudden numbness or weakness of the face, arm or leg; trouble walking; dizziness; loss of balance or coordination  swelling of the ankles, feet, or hands  swollen lymph nodes in the neck, underarm, or groin areas  unusual bleeding or bruising  unusually weak or tired Side effects that usually do not require medical attention (report to your doctor or health care professional if they continue or are bothersome):  headache  nausea  stomach pain  upset stomach This list may not describe all possible side effects. Call your doctor for medical advice about side effects. You may report side effects to FDA at 1-800-FDA-1088. Where should I keep my medicine? This drug is given in a hospital or clinic and will not be stored at home. NOTE: This sheet is a summary. It may not cover all possible information. If you have questions about this medicine, talk to your doctor, pharmacist, or health care provider.  2020 Elsevier/Gold Standard (2016-11-25 13:45:32)

## 2020-06-03 DIAGNOSIS — L659 Nonscarring hair loss, unspecified: Secondary | ICD-10-CM | POA: Diagnosis not present

## 2020-07-09 DIAGNOSIS — L669 Cicatricial alopecia, unspecified: Secondary | ICD-10-CM | POA: Diagnosis not present

## 2020-07-09 DIAGNOSIS — L0100 Impetigo, unspecified: Secondary | ICD-10-CM | POA: Diagnosis not present

## 2020-07-19 ENCOUNTER — Ambulatory Visit (HOSPITAL_COMMUNITY)
Admission: RE | Admit: 2020-07-19 | Discharge: 2020-07-19 | Disposition: A | Discharge status: Home / Self Care | Payer: Medicaid Other | Source: Ambulatory Visit | Attending: Internal Medicine | Admitting: Internal Medicine

## 2020-07-19 ENCOUNTER — Other Ambulatory Visit: Payer: Self-pay

## 2020-07-19 DIAGNOSIS — K523 Indeterminate colitis: Secondary | ICD-10-CM | POA: Insufficient documentation

## 2020-07-19 DIAGNOSIS — K50819 Crohn's disease of both small and large intestine with unspecified complications: Secondary | ICD-10-CM | POA: Diagnosis present

## 2020-07-19 MED ORDER — ACETAMINOPHEN 325 MG PO TABS
650.0000 mg | ORAL_TABLET | Freq: Once | ORAL | Status: AC
  Administered 2020-07-19: 650 mg via ORAL
  Filled 2020-07-19: qty 2

## 2020-07-19 MED ORDER — SODIUM CHLORIDE 0.9 % IV SOLN
10.0000 mg/kg | INTRAVENOUS | Status: DC
  Administered 2020-07-19: 600 mg via INTRAVENOUS
  Filled 2020-07-19: qty 60

## 2020-07-19 MED ORDER — DIPHENHYDRAMINE HCL 25 MG PO CAPS
25.0000 mg | ORAL_CAPSULE | Freq: Once | ORAL | Status: AC
  Administered 2020-07-19: 25 mg via ORAL
  Filled 2020-07-19: qty 1

## 2020-07-19 MED ORDER — SODIUM CHLORIDE 0.9 % IV SOLN
INTRAVENOUS | Status: DC | PRN
  Administered 2020-07-19: 250 mL via INTRAVENOUS

## 2020-07-19 NOTE — Discharge Instructions (Signed)
Infliximab injection What is this medicine? INFLIXIMAB (in [place] i mab) is used to treat Crohn's disease and ulcerative colitis. It is also used to treat ankylosing spondylitis, plaque psoriasis, and some forms of arthritis. This medicine may be used for other purposes; ask your health care provider or pharmacist if you have questions. COMMON BRAND NAME(S): AVSOLA, INFLECTRA, Remicade, RENFLEXIS What should I tell my health care provider before I take this medicine? They need to know if you have any of these conditions:  cancer  current or past resident of [place] or [place]  diabetes  exposure to tuberculosis  Guillain-Barre syndrome  heart failure  hepatitis or liver disease  immune system problems  infection  lung or breathing disease, like COPD  multiple sclerosis  receiving phototherapy for the skin  seizure disorder  an unusual or allergic reaction to infliximab, mouse proteins, other medicines, foods, dyes, or preservatives  pregnant or trying to get pregnant  breast-feeding How should I use this medicine? This medicine is for injection into a vein. It is usually given by a health care professional in a hospital or clinic setting. A special MedGuide will be given to you by the pharmacist with each prescription and refill. Be sure to read this information carefully each time. Talk to your pediatrician regarding the use of this medicine in children. While this drug may be prescribed for children as young as 21 years of age for selected conditions, precautions do apply. Overdosage: If you think you have taken too much of this medicine contact a poison control center or emergency room at once. NOTE: This medicine is only for you. Do not share this medicine with others. What if I miss a dose? It is important not to miss your dose. Call your doctor or health care professional if you are unable to keep an appointment. What may interact with this  medicine? Do not take this medicine with any of the following medications:  biologic medicines such as abatacept, adalimumab, anakinra, certolizumab, etanercept, golimumab, rituximab, secukinumab, tocilizumab, tofactinib, ustekinumab  live vaccines This list may not describe all possible interactions. Give your health care provider a list of all the medicines, herbs, non-prescription drugs, or dietary supplements you use. Also tell them if you smoke, drink alcohol, or use illegal drugs. Some items may interact with your medicine. What should I watch for while using this medicine? Your condition will be monitored carefully while you are receiving this medicine. Visit your doctor or health care professional for regular checks on your progress. You may need blood work done while you are taking this medicine. Before beginning therapy, your doctor may do a test to see if you have been exposed to tuberculosis. Call your doctor or health care professional for advice if you get a fever, chills or sore throat, or other symptoms of a cold or flu. Do not treat yourself. This drug decreases your body's ability to fight infections. Try to avoid being around people who are sick. This medicine may make the symptoms of heart failure worse in some patients. If you notice symptoms such as increased shortness of breath or swelling of the ankles or legs, contact your health care provider right away. If you are going to have surgery or dental work, tell your health care professional or dentist that you have received this medicine. If you take this medicine for plaque psoriasis, stay out of the sun. If you cannot avoid being in the sun, wear protective clothing and use sunscreen. Do  not use sun lamps or tanning beds/booths. Talk to your doctor about your risk of cancer. You may be more at risk for certain types of cancers if you take this medicine. What side effects may I notice from receiving this medicine? Side effects  that you should report to your doctor or health care professional as soon as possible:  allergic reactions like skin rash, itching or hives, swelling of the face, lips, or tongue  breathing problems  changes in vision  chest pain  fever or chills, usually related to the infusion  joint pain  pain, tingling, numbness in the hands or feet  redness, blistering, peeling or loosening of the skin, including inside the mouth  seizures  signs of infection - fever or chills, cough, sore throat, flu-like symptoms, pain or difficulty passing urine  signs and symptoms of liver injury like dark yellow or brown urine; general ill feeling; light-colored stools; loss of appetite; nausea; right upper belly pain; unusually weak or tired; yellowing of the eyes or skin  signs and symptoms of a stroke like changes in vision; confusion; trouble speaking or understanding; severe headaches; sudden numbness or weakness of the face, arm or leg; trouble walking; dizziness; loss of balance or coordination  swelling of the ankles, feet, or hands  swollen lymph nodes in the neck, underarm, or groin areas  unusual bleeding or bruising  unusually weak or tired Side effects that usually do not require medical attention (report to your doctor or health care professional if they continue or are bothersome):  headache  nausea  stomach pain  upset stomach This list may not describe all possible side effects. Call your doctor for medical advice about side effects. You may report side effects to FDA at 1-800-FDA-1088. Where should I keep my medicine? This drug is given in a hospital or clinic and will not be stored at home. NOTE: This sheet is a summary. It may not cover all possible information. If you have questions about this medicine, talk to your doctor, pharmacist, or health care provider.  2020 Elsevier/Gold Standard (2016-11-25 13:45:32)

## 2020-07-19 NOTE — Progress Notes (Signed)
PATIENT CARE CENTER NOTE  Diagnosis: Crohn's disease of small and large intestines with complication ([place]) (M03.491)   Provider: Justice Britain, MD   Procedure: Remicade infusion   Note: Patient received Remicade infusion via PIV. Pre-medications given (Tylenol and Benadryl) per order. Infusion titrated per protocol. Patient tolerated infusion well with no adverse reaction noted. Vital signs remained stable. Patient declined to stay for observation post infusion. Discharge instructions given. Patient to come back in 8 weeks for next infusion. Patient alert, oriented and ambulatory at discharge.

## 2020-08-16 ENCOUNTER — Other Ambulatory Visit: Payer: Self-pay

## 2020-08-16 ENCOUNTER — Ambulatory Visit
Admission: EM | Admit: 2020-08-16 | Discharge: 2020-08-16 | Disposition: A | Discharge status: Home / Self Care | Payer: Medicaid Other

## 2020-08-16 DIAGNOSIS — R0789 Other chest pain: Secondary | ICD-10-CM | POA: Diagnosis not present

## 2020-08-16 NOTE — Discharge Instructions (Signed)
Heat therapy (hot compress, warm wash rag, hot showers, etc.) can help relax muscles and soothe muscle aches. Cold therapy (ice packs) can be used to help swelling both after injury and after prolonged use of areas of chronic pain/aches.  Pain medication:  350 mg-1000 mg of Tylenol (acetaminophen) and/or 200 mg - 800 mg of Advil (ibuprofen, Motrin) every 8 hours as needed.  May alternate between the two throughout the day as they are generally safe to take together.  DO NOT exceed more than 3000 mg of Tylenol or 3200 mg of ibuprofen in a 24 hour period as this could damage your stomach, kidneys, liver, or increase your bleeding risk.  Important to follow up with specialist(s) below for further evaluation/management if your symptoms persist or worsen.

## 2020-08-16 NOTE — ED Triage Notes (Signed)
Pt states that she has been having chest pains x 5 weeks or so. Pt is aox4 and is ambulatory. Pt states pain is worse when she moves and deep breathes.

## 2020-08-16 NOTE — ED Provider Notes (Signed)
[name]    [name]: [id number] Arrival date & time: 08/16/20  1844      History   Chief Complaint Chief Complaint  Patient presents with  . Chest Pain    x 1 month    HPI Erika Stephens is a 29 y.o. female  Presenting for stable, intermittent chest pain for the last 5 weeks.  Denies aggravating or alleviating factors, history of GERD or cardiopulmonary disease.  Denies nausea, vomiting, lightheadedness or dizziness, shortness of breath or lower leg swelling.  No other associated symptoms during these episodes.  Has not taken thing for them.  Past Medical History:  Diagnosis Date  . Anemia   . Anxiety   . Blood transfusion without reported diagnosis   . Crohn's colitis ([place])   . Scalp psoriasis     Patient Active Problem List   Diagnosis Date Noted  . Crohn's disease of small and large intestines with complication ([place]) 94/76/5465  . History of iron deficiency anemia 03/08/2020  . Infliximab (Remicade) long-term use 03/08/2020  . History of immunosuppression therapy 03/08/2020  . Generalized abdominal pain 03/08/2020  . Bloating symptom 03/08/2020  . Chronic diarrhea 03/08/2020    Past Surgical History:  Procedure Laterality Date  . APPENDECTOMY    . BOWEL RESECTION    . COLONOSCOPY    . UPPER GASTROINTESTINAL ENDOSCOPY      OB History   No obstetric history on file.      Home Medications    Prior to Admission medications   Medication Sig Start Date End Date Taking? Authorizing Provider  amoxicillin (AMOXIL) 875 MG tablet Take 1 tablet (875 mg total) by mouth 2 (two) times daily. 05/04/20   Tasia Catchings, Amy V, PA-C  azelastine (ASTELIN) 0.1 % nasal spray Place 2 sprays into both nostrils 2 (two) times daily. 05/04/20   Tasia Catchings, Amy V, PA-C  fluticasone (FLONASE) 50 MCG/ACT nasal spray Place 2 sprays into both nostrils daily. 04/05/20   Yu, Amy V, PA-C  inFLIXimab (REMICADE IV) Inject into the vein. Every 8 weeks    [provider]  Multiple Vitamin  (MULTIVITAMIN ADULT PO) Take by mouth daily.    [provider]  omeprazole (PRILOSEC) 40 MG capsule Take 1 capsule (40 mg total) by mouth daily. 04/19/20   Mansouraty, Telford Nab., MD    Family History Family History  Problem Relation Age of Onset  . Crohn's disease Mother   . Schizophrenia Father   . Bipolar disorder Father   . Crohn's disease Maternal Grandmother   . Prostate cancer Maternal Uncle   . Colon cancer Neg Hx   . Stomach cancer Neg Hx   . Pancreatic cancer Neg Hx   . Esophageal cancer Neg Hx   . Inflammatory bowel disease Neg Hx   . Liver disease Neg Hx   . Rectal cancer Neg Hx     Social History Social History   Tobacco Use  . Smoking status: Never Smoker  . Smokeless tobacco: Never Used  Vaping Use  . Vaping Use: Never used  Substance Use Topics  . Alcohol use: Yes    Comment: socially.   . Drug use: Yes    Types: Marijuana    Comment: last used 04-17-20 per pt     Allergies   Adalimumab and Aspirin   Review of Systems As per HPI   Physical Exam Triage Vital Signs ED Triage Vitals  Enc Vitals Group     BP  Pulse      Resp      Temp      Temp src      SpO2      Weight      Height      Head Circumference      Peak Flow      Pain Score      Pain Loc      Pain Edu?      Excl. in [place]?    No data found.  Updated Vital Signs BP 123/79 (BP Location: Left Arm)   Pulse 69   Temp 98.3 F (36.8 C) (Oral)   Resp 18   SpO2 99%   Visual Acuity Right Eye Distance:   Left Eye Distance:   Bilateral Distance:    Right Eye Near:   Left Eye Near:    Bilateral Near:     Physical Exam Vitals reviewed.  Constitutional:      General: She is not in acute distress.    Appearance: She is normal weight. She is not ill-appearing.  HENT:     Head: Normocephalic and atraumatic.  Eyes:     General: No scleral icterus.       Right eye: No discharge.        Left eye: No discharge.     Extraocular Movements: Extraocular movements  intact.     Pupils: Pupils are equal, round, and reactive to light.  Cardiovascular:     Rate and Rhythm: Normal rate and regular rhythm.     Pulses: Normal pulses.     Heart sounds: No murmur heard.   Pulmonary:     Effort: Pulmonary effort is normal. No respiratory distress.     Breath sounds: No wheezing.  Chest:     Chest wall: No tenderness.  Abdominal:     General: Abdomen is flat.     Palpations: Abdomen is soft.     Tenderness: There is no abdominal tenderness. There is no guarding.  Musculoskeletal:     Cervical back: Normal range of motion and neck supple. No muscular tenderness.  Lymphadenopathy:     Cervical: No cervical adenopathy.  Skin:    General: Skin is warm.     Capillary Refill: Capillary refill takes less than 2 seconds.     Coloration: Skin is not jaundiced or pale.     Findings: No rash.  Neurological:     General: No focal deficit present.     Mental Status: She is alert and oriented to person, place, and time.  Psychiatric:        Mood and Affect: Mood normal.        Thought Content: Thought content normal.      UC Treatments / Results  Labs (all labs ordered are listed, but only abnormal results are displayed) Labs Reviewed - No data to display  EKG   Radiology No results found.  Procedures Procedures (including critical care time)  Medications Ordered in UC Medications - No data to display  Initial Impression / Assessment and Plan / UC Course  I have reviewed the triage vital signs and the nursing notes.  Pertinent labs & imaging results that were available during my care of the patient were reviewed by me and considered in my medical decision making (see chart for details).     Afebrile, nontoxic, hemodynamically stable in office.  EKG done in office without previous to compare: NSR with ventricular at 68 bpm.  No QTC prolongation, ST  elevation or depression.  Nonacute.  Will follow up with PCP for further evaluation thereof: Keep  symptom log in the interim.  Return precautions discussed, pt verbalized understanding and is agreeable to plan. Final Clinical Impressions(s) / UC Diagnoses   Final diagnoses:  Other chest pain     Discharge Instructions     Heat therapy (hot compress, warm wash rag, hot showers, etc.) can help relax muscles and soothe muscle aches. Cold therapy (ice packs) can be used to help swelling both after injury and after prolonged use of areas of chronic pain/aches.  Pain medication:  350 mg-1000 mg of Tylenol (acetaminophen) and/or 200 mg - 800 mg of Advil (ibuprofen, Motrin) every 8 hours as needed.  May alternate between the two throughout the day as they are generally safe to take together.  DO NOT exceed more than 3000 mg of Tylenol or 3200 mg of ibuprofen in a 24 hour period as this could damage your stomach, kidneys, liver, or increase your bleeding risk.  Important to follow up with specialist(s) below for further evaluation/management if your symptoms persist or worsen.    ED Prescriptions    None     PDMP not reviewed this encounter.   Hall[place], [place], [place] 08/16/20 1941

## 2020-09-05 ENCOUNTER — Encounter: Payer: Self-pay | Admitting: Internal Medicine

## 2020-09-08 DIAGNOSIS — H5213 Myopia, bilateral: Secondary | ICD-10-CM | POA: Diagnosis not present

## 2020-09-10 ENCOUNTER — Telehealth (INDEPENDENT_AMBULATORY_CARE_PROVIDER_SITE_OTHER): Payer: Medicaid Other | Admitting: Internal Medicine

## 2020-09-10 DIAGNOSIS — Z7689 Persons encountering health services in other specified circumstances: Secondary | ICD-10-CM | POA: Diagnosis not present

## 2020-09-10 DIAGNOSIS — N3090 Cystitis, unspecified without hematuria: Secondary | ICD-10-CM

## 2020-09-10 DIAGNOSIS — Z862 Personal history of diseases of the blood and blood-forming organs and certain disorders involving the immune mechanism: Secondary | ICD-10-CM | POA: Diagnosis not present

## 2020-09-10 DIAGNOSIS — Z8759 Personal history of other complications of pregnancy, childbirth and the puerperium: Secondary | ICD-10-CM

## 2020-09-10 DIAGNOSIS — K50819 Crohn's disease of both small and large intestine with unspecified complications: Secondary | ICD-10-CM | POA: Diagnosis not present

## 2020-09-10 MED ORDER — CEPHALEXIN 500 MG PO CAPS: 500.0000 mg | ORAL_CAPSULE | Freq: Two times a day (BID) | ORAL | 0 refills | Status: DC

## 2020-09-10 NOTE — Progress Notes (Signed)
Virtual Visit via Telephone Note  I connected with Erika Stephens, on 09/10/2020 at 3:00 PM by telephone due to the COVID-19 pandemic and verified that I am speaking with the correct person using two identifiers.   Consent: I discussed the limitations, risks, security and privacy concerns of performing an evaluation and management service by telephone and the availability of in person appointments. I also discussed with the patient that there may be a patient responsible charge related to this service. The patient expressed understanding and agreed to proceed.   Location of Patient: Home   Location of Provider: Clinic    Persons participating in Telemedicine visit: Paighton Hebard Heide Guile Dr. Juleen China   History of Present Illness: Patient has a visit to establish care.   Has a history of Chron's disease. She had a major bowel surgery in 2019 with resection of her colon. She is established with GI and has f/u on 12/8. She has Remicade injections done q8 weeks. Next one is scheduled for this Friday. Was off of Remicade for over a year when she moved to [facility] and was having trouble establishing care.   Also has a history of Fe deficiency anemia related to her Chron's disease. Has been taking a Woman's MVI. Reports continues good dose of Fe and B12.   Reports history of PP Pre-Eclampsia requiring hospitalization after third child.   Went through a TAB last month after a lot of prayer.   Also worried about some urinary symptoms. Feels like she has pressure on her bladder. Mild dysuria. Going to bathroom more frequently and unable to empty bladder fully. Afebrile. Trying to drink a lot of water.    Past Medical History:  Diagnosis Date  . Anemia   . Anxiety   . Blood transfusion without reported diagnosis   . Central centrifugal scarring alopecia   . Chronic swimmer's ear of right side   . Crohn's colitis ([place])   . Hypertension    Phreesia 09/10/2020  . IDA (iron deficiency  anemia)   . Impetigo   . Postinflammatory hyperpigmentation   . Scalp psoriasis   . Seborrheic dermatitis of scalp    Allergies  Allergen Reactions  . Adalimumab Swelling  . Aspirin Hives    Current Outpatient Medications on File Prior to Visit  Medication Sig Dispense Refill  . etonogestrel-ethinyl estradiol (NUVARING) 0.12-0.015 MG/24HR vaginal ring     . inFLIXimab (REMICADE IV) Inject into the vein. Every 8 weeks    . Multiple Vitamin (MULTIVITAMIN ADULT PO) Take by mouth daily.     No current facility-administered medications on file prior to visit.    Observations/Objective: NAD. Speaking clearly.  Work of breathing normal.  Alert and oriented. Mood appropriate.   Assessment and Plan: 1. Encounter to establish care Reviewed patient's PMH, social history, surgical history, and medications.  Is overdue for annual exam, screening blood work, and health maintenance topics. Have asked patient to return for visit to address these items.    2. Crohn's disease of small and large intestines with complication ([place]) Followed by GI.   3. History of pre-eclampsia Will need to monitor BP at future visits given risk of chronic HTN with h/o Pre-E.   4. History of iron deficiency anemia Will plan to obtain CBC at annual exam. Continue MVI.   5. Cystitis Symptoms concerning for acute uncomplicated UTI. Will treat presumptively with Keflex.  - cephALEXin (KEFLEX) 500 MG capsule; Take 1 capsule (500 mg total) by mouth 2 (two) times daily.  Dispense: 10 capsule; Refill: 0   Follow Up Instructions: Annual Exam 11/19    I discussed the assessment and treatment plan with the patient. The patient was provided an opportunity to ask questions and all were answered. The patient agreed with the plan and demonstrated an understanding of the instructions.   The patient was advised to call back or seek an in-person evaluation if the symptoms worsen or if the condition fails to improve as  anticipated.     I provided 18 minutes total of non-face-to-face time during this encounter including median intraservice time, reviewing previous notes, investigations, ordering medications, medical decision making, coordinating care and patient verbalized understanding at the end of the visit.    Phill Myron, D.O. Primary [facility] at [facility]  09/10/2020, 3:00 PM

## 2020-09-13 ENCOUNTER — Other Ambulatory Visit: Payer: Self-pay

## 2020-09-13 ENCOUNTER — Ambulatory Visit (HOSPITAL_COMMUNITY)
Admission: RE | Admit: 2020-09-13 | Discharge: 2020-09-13 | Disposition: A | Discharge status: Home / Self Care | Payer: Medicaid Other | Source: Ambulatory Visit | Attending: Internal Medicine | Admitting: Internal Medicine

## 2020-09-13 DIAGNOSIS — K5190 Ulcerative colitis, unspecified, without complications: Secondary | ICD-10-CM | POA: Diagnosis not present

## 2020-09-13 MED ORDER — DIPHENHYDRAMINE HCL 25 MG PO CAPS
25.0000 mg | ORAL_CAPSULE | Freq: Once | ORAL | Status: AC
  Administered 2020-09-13: 25 mg via ORAL
  Filled 2020-09-13: qty 1

## 2020-09-13 MED ORDER — SODIUM CHLORIDE 0.9 % IV SOLN
700.0000 mg | INTRAVENOUS | Status: DC
  Administered 2020-09-13: 700 mg via INTRAVENOUS
  Filled 2020-09-13: qty 70

## 2020-09-13 MED ORDER — SODIUM CHLORIDE 0.9 % IV SOLN
INTRAVENOUS | Status: DC | PRN
  Administered 2020-09-13: 250 mL via INTRAVENOUS

## 2020-09-13 MED ORDER — ACETAMINOPHEN 325 MG PO TABS
650.0000 mg | ORAL_TABLET | Freq: Once | ORAL | Status: AC
  Administered 2020-09-13: 650 mg via ORAL
  Filled 2020-09-13: qty 2

## 2020-09-13 NOTE — Progress Notes (Signed)
Patient received IV Remicade as ordered by Justice Britain MD. Pre infusion medications - Benadryl and Tylenol were given. Titration was done per protocol. Patient only waited for about 20 minutes post infusion. Tolerated well, vitals stable, discharge instructions given, verbalized understanding. Alert, oriented and ambulatory at the time of discharge.

## 2020-09-13 NOTE — Discharge Instructions (Signed)
Infliximab injection What is this medicine? INFLIXIMAB (in [place] i mab) is used to treat Crohn's disease and ulcerative colitis. It is also used to treat ankylosing spondylitis, plaque psoriasis, and some forms of arthritis. This medicine may be used for other purposes; ask your health care provider or pharmacist if you have questions. COMMON BRAND NAME(S): AVSOLA, INFLECTRA, Remicade, RENFLEXIS What should I tell my health care provider before I take this medicine? They need to know if you have any of these conditions:  cancer  current or past resident of [place] or [place]  diabetes  exposure to tuberculosis  Guillain-Barre syndrome  heart failure  hepatitis or liver disease  immune system problems  infection  lung or breathing disease, like COPD  multiple sclerosis  receiving phototherapy for the skin  seizure disorder  an unusual or allergic reaction to infliximab, mouse proteins, other medicines, foods, dyes, or preservatives  pregnant or trying to get pregnant  breast-feeding How should I use this medicine? This medicine is for injection into a vein. It is usually given by a health care professional in a hospital or clinic setting. A special MedGuide will be given to you by the pharmacist with each prescription and refill. Be sure to read this information carefully each time. Talk to your pediatrician regarding the use of this medicine in children. While this drug may be prescribed for children as young as 28 years of age for selected conditions, precautions do apply. Overdosage: If you think you have taken too much of this medicine contact a poison control center or emergency room at once. NOTE: This medicine is only for you. Do not share this medicine with others. What if I miss a dose? It is important not to miss your dose. Call your doctor or health care professional if you are unable to keep an appointment. What may interact with this  medicine? Do not take this medicine with any of the following medications:  biologic medicines such as abatacept, adalimumab, anakinra, certolizumab, etanercept, golimumab, rituximab, secukinumab, tocilizumab, tofactinib, ustekinumab  live vaccines This list may not describe all possible interactions. Give your health care provider a list of all the medicines, herbs, non-prescription drugs, or dietary supplements you use. Also tell them if you smoke, drink alcohol, or use illegal drugs. Some items may interact with your medicine. What should I watch for while using this medicine? Your condition will be monitored carefully while you are receiving this medicine. Visit your doctor or health care professional for regular checks on your progress. You may need blood work done while you are taking this medicine. Before beginning therapy, your doctor may do a test to see if you have been exposed to tuberculosis. Call your doctor or health care professional for advice if you get a fever, chills or sore throat, or other symptoms of a cold or flu. Do not treat yourself. This drug decreases your body's ability to fight infections. Try to avoid being around people who are sick. This medicine may make the symptoms of heart failure worse in some patients. If you notice symptoms such as increased shortness of breath or swelling of the ankles or legs, contact your health care provider right away. If you are going to have surgery or dental work, tell your health care professional or dentist that you have received this medicine. If you take this medicine for plaque psoriasis, stay out of the sun. If you cannot avoid being in the sun, wear protective clothing and use sunscreen. Do  not use sun lamps or tanning beds/booths. Talk to your doctor about your risk of cancer. You may be more at risk for certain types of cancers if you take this medicine. What side effects may I notice from receiving this medicine? Side effects  that you should report to your doctor or health care professional as soon as possible:  allergic reactions like skin rash, itching or hives, swelling of the face, lips, or tongue  breathing problems  changes in vision  chest pain  fever or chills, usually related to the infusion  joint pain  pain, tingling, numbness in the hands or feet  redness, blistering, peeling or loosening of the skin, including inside the mouth  seizures  signs of infection - fever or chills, cough, sore throat, flu-like symptoms, pain or difficulty passing urine  signs and symptoms of liver injury like dark yellow or brown urine; general ill feeling; light-colored stools; loss of appetite; nausea; right upper belly pain; unusually weak or tired; yellowing of the eyes or skin  signs and symptoms of a stroke like changes in vision; confusion; trouble speaking or understanding; severe headaches; sudden numbness or weakness of the face, arm or leg; trouble walking; dizziness; loss of balance or coordination  swelling of the ankles, feet, or hands  swollen lymph nodes in the neck, underarm, or groin areas  unusual bleeding or bruising  unusually weak or tired Side effects that usually do not require medical attention (report to your doctor or health care professional if they continue or are bothersome):  headache  nausea  stomach pain  upset stomach This list may not describe all possible side effects. Call your doctor for medical advice about side effects. You may report side effects to FDA at 1-800-FDA-1088. Where should I keep my medicine? This drug is given in a hospital or clinic and will not be stored at home. NOTE: This sheet is a summary. It may not cover all possible information. If you have questions about this medicine, talk to your doctor, pharmacist, or health care provider.  2020 Elsevier/Gold Standard (2016-11-25 13:45:32)

## 2020-09-23 DIAGNOSIS — J029 Acute pharyngitis, unspecified: Secondary | ICD-10-CM | POA: Diagnosis not present

## 2020-09-23 DIAGNOSIS — Z20822 Contact with and (suspected) exposure to covid-19: Secondary | ICD-10-CM | POA: Diagnosis not present

## 2020-09-27 ENCOUNTER — Encounter: Payer: Medicaid Other | Admitting: Internal Medicine

## 2020-10-15 ENCOUNTER — Ambulatory Visit: Payer: Medicaid Other | Admitting: Gastroenterology

## 2020-10-15 ENCOUNTER — Other Ambulatory Visit (INDEPENDENT_AMBULATORY_CARE_PROVIDER_SITE_OTHER): Payer: Medicaid Other

## 2020-10-15 VITALS — BP 104/70 | HR 75 | Ht 61.0 in | Wt 147.0 lb

## 2020-10-15 DIAGNOSIS — R140 Abdominal distension (gaseous): Secondary | ICD-10-CM

## 2020-10-15 DIAGNOSIS — K5010 Crohn's disease of large intestine without complications: Secondary | ICD-10-CM

## 2020-10-15 DIAGNOSIS — R152 Fecal urgency: Secondary | ICD-10-CM

## 2020-10-15 DIAGNOSIS — Z79899 Other long term (current) drug therapy: Secondary | ICD-10-CM

## 2020-10-15 DIAGNOSIS — Z9225 Personal history of immunosupression therapy: Secondary | ICD-10-CM

## 2020-10-15 DIAGNOSIS — R1084 Generalized abdominal pain: Secondary | ICD-10-CM

## 2020-10-15 DIAGNOSIS — K529 Noninfective gastroenteritis and colitis, unspecified: Secondary | ICD-10-CM | POA: Diagnosis not present

## 2020-10-15 DIAGNOSIS — J3489 Other specified disorders of nose and nasal sinuses: Secondary | ICD-10-CM

## 2020-10-15 DIAGNOSIS — K50818 Crohn's disease of both small and large intestine with other complication: Secondary | ICD-10-CM

## 2020-10-15 DIAGNOSIS — Z79620 Long term (current) use of immunosuppressive biologic: Secondary | ICD-10-CM

## 2020-10-15 LAB — IBC + FERRITIN
Ferritin: 2.1 ng/mL — ABNORMAL LOW (ref 10.0–291.0)
Iron: 12 ug/dL — ABNORMAL LOW (ref 42–145)
Saturation Ratios: 2.3 % — ABNORMAL LOW (ref 20.0–50.0)
Transferrin: 377 mg/dL — ABNORMAL HIGH (ref 212.0–360.0)

## 2020-10-15 LAB — CBC WITH DIFFERENTIAL/PLATELET
Basophils Absolute: 0.1 10*3/uL (ref 0.0–0.1)
Basophils Relative: 1.3 % (ref 0.0–3.0)
Eosinophils Absolute: 0.2 10*3/uL (ref 0.0–0.7)
Eosinophils Relative: 2.4 % (ref 0.0–5.0)
HCT: 27.7 % — ABNORMAL LOW (ref 36.0–46.0)
Hemoglobin: 8.5 g/dL — ABNORMAL LOW (ref 12.0–15.0)
Lymphocytes Relative: 37.3 % (ref 12.0–46.0)
Lymphs Abs: 2.4 10*3/uL (ref 0.7–4.0)
MCHC: 30.7 g/dL (ref 30.0–36.0)
MCV: 69.2 fl — ABNORMAL LOW (ref 78.0–100.0)
Monocytes Absolute: 0.4 10*3/uL (ref 0.1–1.0)
Monocytes Relative: 6.8 % (ref 3.0–12.0)
Neutro Abs: 3.4 10*3/uL (ref 1.4–7.7)
Neutrophils Relative %: 52.2 % (ref 43.0–77.0)
Platelets: 351 10*3/uL (ref 150.0–400.0)
RBC: 4 Mil/uL (ref 3.87–5.11)
RDW: 18.1 % — ABNORMAL HIGH (ref 11.5–15.5)
WBC: 6.5 10*3/uL (ref 4.0–10.5)

## 2020-10-15 LAB — COMPREHENSIVE METABOLIC PANEL
ALT: 9 U/L (ref 0–35)
AST: 18 U/L (ref 0–37)
Albumin: 4.1 g/dL (ref 3.5–5.2)
Alkaline Phosphatase: 62 U/L (ref 39–117)
BUN: 16 mg/dL (ref 6–23)
CO2: 25 mEq/L (ref 19–32)
Calcium: 9.4 mg/dL (ref 8.4–10.5)
Chloride: 107 mEq/L (ref 96–112)
Creatinine, Ser: 0.81 mg/dL (ref 0.40–1.20)
GFR: 97.43 mL/min (ref >60.00)
Glucose, Bld: 74 mg/dL (ref 70–99)
Potassium: 3.8 mEq/L (ref 3.5–5.1)
Sodium: 138 mEq/L (ref 135–145)
Total Bilirubin: 0.3 mg/dL (ref 0.2–1.2)
Total Protein: 7.9 g/dL (ref 6.0–8.3)

## 2020-10-15 LAB — CORTISOL: Cortisol, Plasma: 6 ug/dL

## 2020-10-15 LAB — PROTIME-INR
INR: 1 ratio (ref 0.8–1.0)
Prothrombin Time: 11.6 s (ref 9.6–13.1)

## 2020-10-15 LAB — HIGH SENSITIVITY CRP: CRP, High Sensitivity: 0.2 mg/L (ref 0.000–5.000)

## 2020-10-15 LAB — SEDIMENTATION RATE: Sed Rate: 61 mm/hr — ABNORMAL HIGH (ref 0–20)

## 2020-10-15 MED ORDER — CHOLESTYRAMINE 4 G PO PACK: 4.0000 g | PACK | Freq: Three times a day (TID) | ORAL | 3 refills | Status: DC

## 2020-10-15 NOTE — Patient Instructions (Addendum)
Your provider has requested that you go to the basement level for lab work before leaving today. Press "B" on the elevator. The lab is located at the first door on the left as you exit the elevator.  If you are age 30 or younger, your body mass index should be between 19-25. Your Body mass index is 27.78 kg/m. If this is out of the aformentioned range listed, please consider follow up with your Primary Care Provider.   Use fiber Con 1-2 tablets daily.   Start Cholestyramine- 1 packet three times daily.   You will need to have labs drawn prior to your Remicaide Infusion. CBC, CMP, ESR and CRP.Nurse will send order to infusion center to have this added.   We will see you back in 3 months. Office will call with an appointment.   Thank you for choosing me and [facility].  Dr. Rush Landmark

## 2020-10-15 NOTE — Progress Notes (Addendum)
[name] VISIT   Primary Care Provider [name], [address], [place] [address] [phone]  Patient Profile: Erika Stephens is a 29 y.o. female with a pmh significant for Crohn's disease (diagnosed in 2011 and reinitiated on Remicade), status post appendectomy and ileocecectomy, anal fissures, possible psoriasis, MDD/anxiety, iron deficiency anemia, status post 3 pregnancies.  The patient presents to the [facility] for an evaluation and management of problem(s) noted below:  Problem List 1. Crohn's disease of both small and large intestine with other complication ([place])   2. Fecal urgency   3. Generalized abdominal pain   4. Chronic diarrhea   5. Bloating symptom   6. Infliximab (Remicade) long-term use   7. History of immunosuppression therapy     History of Present Illness Please see initial consultation note for full details of HPI.    Interval History This is the patient's first visit in approximately 5 months since we reinitiated her Remicade.  The patient's psoriasis-like scalp issues improved after initiation of Remicade.  However, she had seen a holistic medicine provider who suggested that this was actually not psoriasis but rather fungus but it improved with Remicade treatments.  With that being said patient has done well initially after reinitiation of her Remicade however over the course of the last few weeks she has noted that she gets sick for approximately 1 week after her Remicade has been infused.  Subsequently gets better for about a week or so in regards to her abdominal discomfort and diarrhea.  However towards the last 3 to 4 weeks she began to experience issues of urgency as well as diarrhea.  There is no blood in her stools.  She is not having any mucus.  She is now waking up at night to have a bowel movement.  She is wondering why the Remicade is not working like it has previously.  She  denies any fevers or chills.  She has gained weight.  At times she has fecal urgency and incomplete evacuation.  She gets abdominal cramping after she eats as well.  Patient feels that her iron deficiency is possibly worse but she is taking a multivitamin. She describes 2-1/2 to 3 months ago having seeing ENT because of issues of a chronic ear infection. In the last 3 to 4 weeks she has been having nose sores that come and go but she hasn't discussed this with ENT as of yet. She wonders if this could be IBD related. She has recently been treated for UTI with Keflex as well.  GI Review of Systems Positive as above Negative for odynophagia, dysphagia, nausea, vomiting, melena, hematochezia  Review of Systems General: Denies fevers/chills/unintentional weight loss HEENT: Denies oral lesions Cardiovascular: Denies chest pain/palpitations Pulmonary: Denies shortness of breath Gastroenterological: See HPI Genitourinary: She states that she has been having some urinary discomfort at times and is working with her PCP about this; denies darkened urine or hematuria Hematological: Denies easy bruising/bleeding Dermatological: Prior scalp psoriasis versus fungal infection has improved; denies jaundice Psychological: Mood is anxious but stable   Medications Current Outpatient Medications  Medication Sig Dispense Refill  . etonogestrel-ethinyl estradiol (NUVARING) 0.12-0.015 MG/24HR vaginal ring     . inFLIXimab (REMICADE IV) Inject into the vein. Every 8 weeks    . Multiple Vitamin (MULTIVITAMIN ADULT PO) Take by mouth daily.    . Multiple Vitamins-Minerals (HAIR SKIN AND NAILS FORMULA) TABS Take 1 tablet by mouth daily.    . cholestyramine Lucrezia Starch)  4 g packet Take 1 packet (4 g total) by mouth 3 (three) times daily with meals. 60 each 3   No current facility-administered medications for this visit.    Allergies Allergies  Allergen Reactions  . Adalimumab Swelling  . Aspirin Hives     Histories Past Medical History:  Diagnosis Date  . Anemia   . Anxiety   . Blood transfusion without reported diagnosis   . Central centrifugal scarring alopecia   . Chronic swimmer's ear of right side   . Crohn's colitis ([place])   . Hypertension    Phreesia 09/10/2020  . IDA (iron deficiency anemia)   . Impetigo   . Postinflammatory hyperpigmentation   . Scalp psoriasis   . Seborrheic dermatitis of scalp    Past Surgical History:  Procedure Laterality Date  . APPENDECTOMY    . BOWEL RESECTION    . COLONOSCOPY    . UPPER GASTROINTESTINAL ENDOSCOPY     Social History   Socioeconomic History  . Marital status: Unknown    Spouse name: Not on file  . Number of children: Not on file  . Years of education: Not on file  . Highest education level: Not on file  Occupational History  . Not on file  Tobacco Use  . Smoking status: Never Smoker  . Smokeless tobacco: Never Used  Vaping Use  . Vaping Use: Never used  Substance and Sexual Activity  . Alcohol use: Yes    Comment: socially.   . Drug use: Yes    Types: Marijuana    Comment: last used 04-17-20 per pt  . Sexual activity: Not Currently  Other Topics Concern  . Not on file  Social History Narrative  . Not on file   Social Determinants of Health   Financial Resource Strain:   . Difficulty of Paying Living Expenses: Not on file  Food Insecurity:   . Worried About Charity fundraiser in the Last Year: Not on file  . Ran Out of Food in the Last Year: Not on file  Transportation Needs:   . Lack of Transportation (Medical): Not on file  . Lack of Transportation (Non-Medical): Not on file  Physical Activity:   . Days of Exercise per Week: Not on file  . Minutes of Exercise per Session: Not on file  Stress:   . Feeling of Stress : Not on file  Social Connections:   . Frequency of Communication with Friends and Family: Not on file  . Frequency of Social Gatherings with Friends and Family: Not on file  . Attends  Religious Services: Not on file  . Active Member of Clubs or Organizations: Not on file  . Attends Archivist Meetings: Not on file  . Marital Status: Not on file  Intimate Partner Violence:   . Fear of Current or Ex-Partner: Not on file  . Emotionally Abused: Not on file  . Physically Abused: Not on file  . Sexually Abused: Not on file   Family History  Problem Relation Age of Onset  . Crohn's disease Mother   . Schizophrenia Father   . Bipolar disorder Father   . Crohn's disease Maternal Grandmother   . Prostate cancer Maternal Uncle   . Colon cancer Neg Hx   . Stomach cancer Neg Hx   . Pancreatic cancer Neg Hx   . Esophageal cancer Neg Hx   . Inflammatory bowel disease Neg Hx   . Liver disease Neg Hx   . Rectal cancer  Neg Hx    I have reviewed her medical, social, and family history in detail and updated the electronic medical record as necessary.    PHYSICAL EXAMINATION  BP 104/70 (BP Location: Left Arm, Patient Position: Sitting, Cuff Size: Normal)   Pulse 75   Ht 5' 1"  (1.549 m)   Wt 147 lb (66.7 kg)   SpO2 99%   BMI 27.78 kg/m  Wt Readings from Last 3 Encounters:  10/15/20 147 lb (66.7 kg)  09/13/20 149 lb (67.6 kg)  07/19/20 130 lb (59 kg)  GEN: NAD, appears stated age, doesn't appear chronically ill PSYCH: Cooperative, without pressured speech EYE: Conjunctivae pink, sclerae anicteric ENT: MMM, without oral ulcers CV: Nontachycardic RESP: No audible wheezing GI: NABS, soft, tenderness to palpation generalized throughout the abdomen, volitional guarding is present, no rebound, surgical scars present from previous resections MSK/EXT: No lower extremity edema SKIN: Tattoos present; no jaundice NEURO:  Alert & Oriented x 3, no focal deficits   REVIEW OF DATA  I reviewed the following data at the time of this encounter:  GI Procedures and Studies  June 2021 colonoscopy - Hemorrhoids found on digital rectal exam. - Stool in the entire examined  colon. Lavaged with adequate visualization. - Patent functional end-to-end ileo-colonic anastomosis, characterized by healthy appearing mucosa. - The examined portion of the ileum was normal. Biopsied. - Normal mucosa in the entire examined colon. Biopsied for IBD surveillance. - Non-bleeding non-thrombosed internal hemorrhoids.  June 2021 EGD - No gross lesions in esophagus. Z-line regular, 37 cm from the incisors. - Erythematous mucosa in the gastric fundus and gastric body. No other gross lesions in the stomach. Biopsied. - No gross lesions in the duodenal bulb, in the first portion of the duodenum and in the second portion of the duodenum. Biopsied.  Pathology Diagnosis 1. Surgical [P], duodenal - DUODENAL MUCOSA WITH NO SIGNIFICANT PATHOLOGIC FINDINGS. - NEGATIVE FOR INCREASED INTRAEPITHELIAL LYMPHOCYTES AND VILLOUS ARCHITECTURAL CHANGES. 2. Surgical [P], gastric - GASTRIC ANTRAL MUCOSA WITH MILD REACTIVE GASTROPATHY. - GASTRIC OXYNTIC MUCOSA WITH MILD CHRONIC GASTRITIS. - WARTHIN-STARRY STAIN IS NEGATIVE FOR HELICOBACTER PYLORI. 3. Surgical [P], small bowel, neo terminal ileum - ILEAL MUCOSA WITH NO SIGNIFICANT PATHOLOGIC FINDINGS. - NEGATIVE FOR ACTIVE INFLAMMATION, GRANULOMATA AND DYSPLASIA. 4. Surgical [P], right colon - COLONIC MUCOSA WITH NO SIGNIFICANT PATHOLOGIC FINDINGS. - NEGATIVE FOR ACTIVE INFLAMMATION, GRANULOMATA AND DYSPLASIA. 5. Surgical [P], left colon - COLONIC MUCOSA WITH NO SIGNIFICANT PATHOLOGIC FINDINGS. - NEGATIVE FOR ACTIVE INFLAMMATION, GRANULOMATA AND DYSPLASIA. 6. Surgical [P], colon, rectum - COLONIC MUCOSA WITH NO SIGNIFICANT PATHOLOGIC FINDINGS. - NEGATIVE FOR ACTIVE INFLAMMATION, GRANULOMATA AND DYSPLASIA.  Laboratory Studies  Reviewed those in epic  Imaging Studies  No new relevant studies to review  Outside Records  No new outside records to review   ASSESSMENT  Erika Stephens is a 29 y.o. female with a pmh significant for Crohn's  disease (diagnosed in 2011 and reinitiated on Remicade), status post appendectomy and ileocecectomy, anal fissures, possible psoriasis, MDD/anxiety, iron deficiency anemia, status post 3 pregnancies.  The patient is seen today for evaluation and management of:  1. Crohn's disease of both small and large intestine with other complication ([place])   2. Fecal urgency   3. Generalized abdominal pain   4. Chronic diarrhea   5. Bloating symptom   6. Infliximab (Remicade) long-term use   7. History of immunosuppression therapy    The patient is hemodynamically stable. From a clinical standpoint the patient's last exam endoscopically showed no evidence  of active disease with inflammatory markers being unremarkable. She has been reinitiated and maintained on Remicade at this time. With that being said her symptoms at this point do suggest the possibility of recurrence of inflammation versus IBS overlap. She also has no ileum and so is at risk of bile salt diarrhea as well. In regards to her symptoms right now we are going to initiate her on bulking fiber (FiberCon or Metamucil) and also initiate her on cholestyramine. We'll see how her bowel movements progressed in the setting of being on this as well. We'll get her up-to-date laboratory perspective to see how she is done as well as evaluate for any inflammatory markers. We'll ask to have standing orders for her blood count as well as CMP as well as inflammatory markers to be drawn at each of her infusions. Her next infusion is tomorrow and today we will draw a trough level and antibody level to ensure that we can continue maintaining her on Remicade. She is at risk for SIBO as well as EPI and we will have to consider these in the future as well. The patient understands the risks of continued/chronic immunosuppression therapy as she has been on Remicade for many years and she would like to continue to maintain this at this time. The patient understands that if the  above work-up is unremarkable and she does not respond to the treatments that we have initiated we may need to consider repeat endoscopic evaluation to evaluate for activity of her disease, earlier than anticipated. All patient questions were answered to the best of my ability, and the patient agrees to the aforementioned plan of action with follow-up as indicated.   PLAN  Laboratories as outlined below Remicade trough and Remicade antibodies to be drawn today Continue Remicade 10 mix per cake infusions -Tylenol/Benadryl/Solu-Medrol as per protocol -Standing CBC/CMP/ESR/CRP to be drawn at each infusion  if iron deficiency is present will need IV iron infusions FiberCon or Metamucil once daily to be initiated Start cholestyramine 3 times daily Consider SIBO evaluation in future Consider EPI evaluation in future Patient will need DEXA scan at some point in future Holding on steroid therapy at this time in no setting of active disease being present currently Told patient once again to stop taking grandmother's budesonide Follow-up with ENT about nasal sores   Orders Placed This Encounter  Procedures  . Calprotectin, Fecal  . CBC w/Diff  . Comp Met (CMET)  . Cortisol  . Sedimentation rate  . CRP High sensitivity  . IBC + Ferritin  . INR/PT  . Clostridium difficile Toxin B, Qualitative, Real-Time PCR  . Gastrointestinal Pathogen Panel PCR  . Infliximab+Ab (Serial Monitor)    New Prescriptions   CHOLESTYRAMINE (QUESTRAN) 4 G PACKET    Take 1 packet (4 g total) by mouth 3 (three) times daily with meals.   Modified Medications   No medications on file    Planned Follow Up No follow-ups on file.   Total Time in Face-to-Face and in Coordination of Care for patient including independent/personal interpretation/review of prior testing, medical history, examination, medication adjustment, communicating results with the patient directly, and documentation with the EHR is 30  minutes.   Justice Britain, MD [facility] Advanced [facility]oscopy Office # [phone]

## 2020-10-16 ENCOUNTER — Other Ambulatory Visit: Payer: Self-pay

## 2020-10-16 ENCOUNTER — Encounter: Payer: Self-pay | Admitting: Gastroenterology

## 2020-10-16 ENCOUNTER — Inpatient Hospital Stay (HOSPITAL_COMMUNITY): Admission: RE | Admit: 2020-10-16 | Payer: Medicaid Other | Source: Ambulatory Visit

## 2020-10-16 DIAGNOSIS — R152 Fecal urgency: Secondary | ICD-10-CM | POA: Insufficient documentation

## 2020-10-16 DIAGNOSIS — J3489 Other specified disorders of nose and nasal sinuses: Secondary | ICD-10-CM | POA: Insufficient documentation

## 2020-10-17 ENCOUNTER — Encounter: Payer: Medicaid Other | Admitting: Internal Medicine

## 2020-10-20 LAB — SERIAL MONITORING

## 2020-10-21 ENCOUNTER — Other Ambulatory Visit: Payer: Self-pay

## 2020-10-21 DIAGNOSIS — K50818 Crohn's disease of both small and large intestine with other complication: Secondary | ICD-10-CM

## 2020-10-21 DIAGNOSIS — D509 Iron deficiency anemia, unspecified: Secondary | ICD-10-CM

## 2020-10-21 LAB — INFLIXIMAB+AB (SERIAL MONITOR)
Anti-Infliximab Antibody: 22 ng/mL
Infliximab Drug Level: 24 ug/mL

## 2020-10-21 MED ORDER — FERROUS GLUCONATE 324 (38 FE) MG PO TABS: 324.0000 mg | ORAL_TABLET | Freq: Every day | ORAL | 0 refills | Status: DC

## 2020-10-24 ENCOUNTER — Telehealth: Payer: Medicaid Other | Admitting: Internal Medicine

## 2020-10-30 ENCOUNTER — Telehealth: Payer: Self-pay | Admitting: Physician Assistant

## 2020-10-30 NOTE — Telephone Encounter (Signed)
Received a new hem referral from Dr. Rush Landmark for anemia. Erika Stephens has been cld and scheduled to see [name] on 1/6 at 1pm w/labs at 1230pm. Pt aware to arrive 30 minutes early. Letter mailed.

## 2020-10-31 ENCOUNTER — Encounter: Payer: Medicaid Other | Admitting: Internal Medicine

## 2020-11-05 ENCOUNTER — Telehealth: Payer: Self-pay | Admitting: Gastroenterology

## 2020-11-05 ENCOUNTER — Other Ambulatory Visit: Payer: Self-pay

## 2020-11-05 ENCOUNTER — Ambulatory Visit (HOSPITAL_COMMUNITY)
Admission: RE | Admit: 2020-11-05 | Discharge: 2020-11-05 | Disposition: A | Discharge status: Home / Self Care | Payer: Medicaid Other | Source: Ambulatory Visit | Attending: Internal Medicine | Admitting: Internal Medicine

## 2020-11-05 DIAGNOSIS — D509 Iron deficiency anemia, unspecified: Secondary | ICD-10-CM

## 2020-11-05 DIAGNOSIS — K5190 Ulcerative colitis, unspecified, without complications: Secondary | ICD-10-CM | POA: Diagnosis present

## 2020-11-05 MED ORDER — SODIUM CHLORIDE 0.9 % IV SOLN
510.0000 mg | INTRAVENOUS | Status: DC
  Administered 2020-11-05: 510 mg via INTRAVENOUS
  Filled 2020-11-05: qty 510

## 2020-11-05 MED ORDER — SODIUM CHLORIDE 0.9 % IV SOLN
INTRAVENOUS | Status: DC | PRN
  Administered 2020-11-05: 250 mL via INTRAVENOUS

## 2020-11-05 NOTE — Discharge Instructions (Signed)

## 2020-11-05 NOTE — Progress Notes (Signed)
Patient received IV Feraheme as ordered by Justice Britain MD. Observed for at least 30 minutes post infusion.Tolerated well, vitals stable, discharge instructions given, verbalized understanding. Patient alert, oriented and ambulatory at the time of discharge.

## 2020-11-05 NOTE — Telephone Encounter (Signed)
Do you have the phone number to call them back?  We did not order the iron infusion.  Thanks

## 2020-11-05 NOTE — Telephone Encounter (Signed)
Patient scheduled today for iron infusion but they do not have any orders for it she usually goes for Remacade only. The office also stated that the patient is to get labs prior to next [facility] so they would also need an order for that as well with all indications needed  [phone]

## 2020-11-05 NOTE — Telephone Encounter (Signed)
Pt is requesting a call back from a nurse to clarify why was she told she needed an iron infusion.  CB [phone]

## 2020-11-05 NOTE — Telephone Encounter (Signed)
Looks like PCP ordered the Iron infusion and referral entered for hematology by our office. Pt scheduled for iron infusions by PCP.  I have entered the order for patient care center to proceed with IV iron at this time.

## 2020-11-10 ENCOUNTER — Encounter: Payer: Self-pay | Admitting: Gastroenterology

## 2020-11-10 NOTE — Progress Notes (Signed)
Veracyte results  Remicade drug level of 19.6 mcg/mL Remicade antibody level less than 10 ng/mL  These records will be scanned into the chart. We will see how the patient is doing in February but continue her current Remicade dosing for now  Justice Britain, MD [facility] Office # [phone]

## 2020-11-12 ENCOUNTER — Other Ambulatory Visit: Payer: Self-pay

## 2020-11-12 ENCOUNTER — Non-Acute Institutional Stay (HOSPITAL_COMMUNITY)
Admission: RE | Admit: 2020-11-12 | Discharge: 2020-11-12 | Disposition: A | Discharge status: Home / Self Care | Payer: Medicaid Other | Source: Ambulatory Visit | Attending: Internal Medicine | Admitting: Internal Medicine

## 2020-11-12 DIAGNOSIS — K5090 Crohn's disease, unspecified, without complications: Secondary | ICD-10-CM | POA: Diagnosis not present

## 2020-11-12 MED ORDER — DIPHENHYDRAMINE HCL 25 MG PO CAPS
25.0000 mg | ORAL_CAPSULE | Freq: Four times a day (QID) | ORAL | Status: DC | PRN
  Filled 2020-11-12: qty 1

## 2020-11-12 MED ORDER — SODIUM CHLORIDE 0.9 % IV SOLN
INTRAVENOUS | Status: DC | PRN
  Administered 2020-11-12: 250 mL via INTRAVENOUS

## 2020-11-12 MED ORDER — DIPHENHYDRAMINE HCL 25 MG PO CAPS
25.0000 mg | ORAL_CAPSULE | Freq: Once | ORAL | Status: AC
  Administered 2020-11-12: 25 mg via ORAL

## 2020-11-12 MED ORDER — SODIUM CHLORIDE 0.9 % IV SOLN
10.0000 mg/kg | INTRAVENOUS | Status: DC
  Administered 2020-11-12: 700 mg via INTRAVENOUS
  Filled 2020-11-12: qty 70

## 2020-11-12 MED ORDER — ACETAMINOPHEN 325 MG PO TABS
650.0000 mg | ORAL_TABLET | Freq: Once | ORAL | Status: AC
  Administered 2020-11-12: 650 mg via ORAL
  Filled 2020-11-12: qty 2

## 2020-11-12 NOTE — Progress Notes (Signed)
[place] [phone]   [phone]  CONSULT NOTE  REFERRING PHYSICIAN: Dr. Rush Landmark   REASON FOR CONSULTATION:  Iron Deficiency Anemia  HPI Erika Stephens is a 30 y.o. female with a past medical history significant for Crohn's disease currently on immunosuppressive therapy with Remicade, anal fissures, major depressive disorder, and anxiety is referred to the clinic for consideration of IV iron infusions.  The patient follows up with Dr. Rush Landmark from [facility] gastroenterology for her history of Crohn's disease status post append[place]. She saw GI on 10/15/20 for a routine follow-up visit on since restarting her Remicade approximately 5 months prior.  She had routine blood work performed that day which showed microcytic anemia with hemoglobin at 8.5, MCV low at 69.  Her platelet count and white blood cell count was within normal limits.  She had significant iron deficiency with a low iron low at 12, transferritin elevated at 377, a very low saturation at 2.3%, and a very low ferritin at 2.1.  She was started on oral iron supplements 325 mg of ferrous sulfate daily which she has not started taking yet. She was referred to our clinic for consideration of IV iron infusions.  Gastroenterology is going to perform stool cards to rule out occult blood.   Her last colonoscopy and upper endoscopy was performed on 04/19/20 which showed some grade II non-bleeding internal hemorrhoids but the mucosa was normal in the entire colon. The endoscopy was unremarkable except for some erythematous mucosa in the gastric fundus and gastric body.   To the patient's knowledge, she has been anemic since around 2010 or so. Takes a multivitamin daily with iron.  The patient has never required a blood or an iron infusion except for in ~2010 she was hospitalized in [place] for what sounds like pyelonephritis. She reportedly required a blood  and iron infusion during that hospitalization. Then last week on 11/05/20, she received 1 dose of feraheme at the patient care center. She notes she has not craved ice since receiving her iron infusion. The patient is new to [place], having moved here about 1 year ago. She is not established with a local OBGYN. She does have a wellness visit with a new PCP later this month. The patient denies any history of any fibroids.  The patient states that she has menstrual cycles approximately every 4 weeks that last for approximately 7 days.  She uses approximately 4 heavy menstrual flow pads per day. She states she has dark clots with her menstrual periods. [facility] and hs had heavier menstrual periods since that time. She states in the past, she took the "abortion pill" which caused significant bleeding; therefore, she underwent a procedure this time.  The patient denies any other abnormal bleeding or bruising including epistaxis, hematemesis, hemoptysis, easy bruising, or hematuria.  Her fatigue is stable. She reports some shortness of breath with exertion/decreased exercise tolerance and palpitations. She states sometimes she has chest achiness. She had an EKG performed for this which was unremarkable. She reports breast achiness too.  Patient denies any blood thinner use.  She denies any frequent NSAID use.  The patient denies any particular dietary habits such as being a vegan or vegetarian except she does not eat a lot of red meat. She has red meat approximately 3x per month. She does eat plenty of green leafy foods rich in iron though.  Denies any fever, chills, night sweats, or unexplained weight loss.  The patient denies any family history of thalassemia or sickle cell anemia except for her son who has sickle cell trait which she inherited from his father.  The patient was checked for hemoglobinopathy which was negative.  The patient's mother and grandmother also  have Crohn's disease.  The patient's paternal aunt has uterine cancer.  Patient's father has bipolar schizophrenia.   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  She is single and has 3 children.  She denies any tobacco use.  She occasionally smokes marijuana.  She drinks approximately 1 bottle of wine every week.  HPI  Past Medical History:  Diagnosis Date   Anemia    Anxiety    Blood transfusion without reported diagnosis    Central centrifugal scarring alopecia    Chronic swimmer's ear of right side    Crohn's colitis ([place])    Hypertension    Phreesia 09/10/2020   IDA (iron deficiency anemia)    Impetigo    Postinflammatory hyperpigmentation    Scalp psoriasis    Seborrheic dermatitis of scalp     Past Surgical History:  Procedure Laterality Date   APPENDECTOMY     BOWEL RESECTION     COLONOSCOPY     UPPER GASTROINTESTINAL ENDOSCOPY      Family History  Problem Relation Age of Onset   Crohn's disease Mother    Schizophrenia Father    Bipolar disorder Father    Crohn's disease Maternal Grandmother    Prostate cancer Maternal Uncle    Colon cancer Neg Hx    Stomach cancer Neg Hx    Pancreatic cancer Neg Hx    Esophageal cancer Neg Hx    Inflammatory bowel disease Neg Hx    Liver disease Neg Hx    Rectal cancer Neg Hx     Social History Social History   Tobacco Use   Smoking status: Never Smoker   Smokeless tobacco: Never Used  Vaping Use   Vaping Use: Never used  Substance Use Topics   Alcohol use: Yes    Comment: socially.    Drug use: Yes    Types: Marijuana    Comment: last used 04-17-20 per pt    Allergies  Allergen Reactions   Adalimumab Swelling   Aspirin Hives    Current Outpatient Medications  Medication Sig Dispense Refill   cholestyramine (QUESTRAN) 4 g packet Take 1 packet (4 g total) by mouth 3 (three) times daily  with meals. (Patient not taking: Reported on 11/14/2020) 60 each 3   etonogestrel-ethinyl estradiol (NUVARING) 0.12-0.015 MG/24HR vaginal ring  (Patient not taking: Reported on 11/14/2020)     ferrous gluconate (FERGON) 324 MG tablet Take 1 tablet (324 mg total) by mouth daily with breakfast. (Patient not taking: Reported on 11/14/2020) 30 tablet 0   inFLIXimab (REMICADE IV) Inject into the vein. Every 8 weeks (Patient not taking: Reported on 11/14/2020)     Multiple Vitamin (MULTIVITAMIN ADULT PO) Take by mouth daily. (Patient not taking: Reported on 11/14/2020)     Multiple Vitamins-Minerals (HAIR SKIN AND NAILS FORMULA) TABS Take 1 tablet by mouth daily. (Patient not taking: Reported on 11/14/2020)     No current facility-administered medications for this visit.     REVIEW OF SYSTEMS:   Review of Systems  Constitutional: Positive for fatigue. Negative for appetite change, chills, fatigue, fever and unexpected weight  change.  HENT: Negative for mouth sores, nosebleeds, sore throat and trouble swallowing.   Eyes: Negative for eye problems and icterus.  Respiratory: Positive for shortness of breath with exertion. Negative for cough, hemoptysis, and wheezing.   Cardiovascular: Positive for chest pain. Negative for leg swelling.  Gastrointestinal: Negative for abdominal pain, constipation, diarrhea, nausea and vomiting.  Genitourinary: Negative for bladder incontinence, difficulty urinating, dysuria, frequency and hematuria.   Musculoskeletal: Negative for back pain, gait problem, neck pain and neck stiffness.  Skin: Negative for itching and rash.  Neurological: Negative for dizziness, extremity weakness, gait problem, headaches, light-headedness and seizures.  Hematological: Negative for adenopathy. Does not bruise/bleed easily.  Psychiatric/Behavioral: Negative for confusion, depression and sleep disturbance. The patient is not nervous/anxious.     PHYSICAL EXAMINATION:  Blood pressure 114/66,  pulse 64, temperature 97.6 F (36.4 C), temperature source Tympanic, resp. rate 17, height 5' 1"  (1.549 m), weight 152 lb 6.4 oz (69.1 kg), SpO2 100 %.  ECOG PERFORMANCE STATUS: 1  Physical Exam  Constitutional: Oriented to person, place, and time and well-developed, well-nourished, and in no distress.  HENT:  Head: Normocephalic and atraumatic.  Mouth/Throat: Oropharynx is clear and moist. No oropharyngeal exudate.  Eyes: Conjunctivae are normal. Right eye exhibits no discharge. Left eye exhibits no discharge. No scleral icterus.  Neck: Normal range of motion. Neck supple.  Cardiovascular: Normal rate, regular rhythm, normal heart sounds and intact distal pulses.   Pulmonary/Chest: Effort normal and breath sounds normal. No respiratory distress. No wheezes. No rales.  Abdominal: Soft. Bowel sounds are normal. Exhibits no distension and no mass. There is no tenderness.  Musculoskeletal: Normal range of motion. Exhibits no edema.  Lymphadenopathy:    No cervical adenopathy.  Neurological: Alert and oriented to person, place, and time. Exhibits normal muscle tone. Gait normal. Coordination normal.  Skin: Skin is warm and dry. No rash noted. Not diaphoretic. No erythema. No pallor.  Psychiatric: Mood, memory and judgment normal.  Vitals reviewed.  LABORATORY DATA: Lab Results  Component Value Date   WBC 7.5 11/14/2020   HGB 9.2 (L) 11/14/2020   HCT 32.4 (L) 11/14/2020   MCV 78.6 (L) 11/14/2020   PLT 314 11/14/2020      Chemistry      Component Value Date/Time   NA 137 11/14/2020 1239   K 3.8 11/14/2020 1239   CL 106 11/14/2020 1239   CO2 26 11/14/2020 1239   BUN 13 11/14/2020 1239   CREATININE 0.84 11/14/2020 1239      Component Value Date/Time   CALCIUM 9.5 11/14/2020 1239   ALKPHOS 73 11/14/2020 1239   AST 21 11/14/2020 1239   ALT 15 11/14/2020 1239   BILITOT 0.4 11/14/2020 1239       RADIOGRAPHIC STUDIES: No results found.  ASSESSMENT: This is a very pleasant  30 year old African-American female referred to the clinic for evaluation of iron deficiency anemia   PLAN: The patient was seen with Dr. Julien Nordmann today.  The patient had a repeat CBC, CMP, iron studies, ferritin, Z36, and folic acid performed today.  The patient CBC demonstrated a Hbg of 9.2, low MCV of 78.6. her WBC and platelets were WNL. She did receive 1 dose of feraheme. Her iron studies show persistent but improved iron deficiency anemia with a total iron of 39, saturation ration of 9% and improved ferritin of 128. Her B12 and folate were WNL.   Dr. Julien Nordmann recommends we arrange for a second dose of feraheme to be given next week.  The patient was encouraged to pick up and start taking an oral iron supplement.  She was also given a handout on iron rich food.   We will see the patient back for follow-up visit in 6 weeks for evaluation repeat CBC, iron studies, and ferritin.  She was encouraged to establish care with a PCP and OBGYN.   The patient voices understanding of current disease status and treatment options and is in agreement with the current care plan.  All questions were answered. The patient knows to call the clinic with any problems, questions or concerns. We can certainly see the patient much sooner if necessary.  Thank you so much for allowing me to participate in the care of [place]. I will continue to follow up the patient with you and assist in her care.   The total time spent in the appointment was 55 minutes.   Disclaimer: This note was dictated with voice recognition software. Similar sounding words can inadvertently be transcribed and may not be corrected upon review.   Janat Tabbert L Tansy Lorek November 14, 2020, 3:42 PM  ADDENDUM: Hematology/Oncology Attending: I had a face-to-face encounter with the patient today.  I reviewed her medical records and recommended her care plan.  This is a very pleasant 30 years old African-American female with history of  Crohn's disease treated with Remicade when she was in [place] and she currently moved to [place] and under the care of Dr. Rush Landmark.  The patient also has a history of anal fissure as well as an anxiety/depression disorder.  During her evaluation by gastroenterology the patient was found to have significant anemia with hemoglobin down to 8.5 and MCV of 69.  Her iron study at that time showed severe iron deficiency with serum iron of 12 and ferritin level of 2.1.  The patient received 1 dose of IV iron infusion for her iron deficiency and she was referred to [place] today for further evaluation and further treatment with iron infusion at the cancer center. She continues to have some gastrointestinal loss from the Crohn's disease as well as internal hemorrhoids.  The patient was not on any iron supplements.  She was given prescription by Dr. Rush Landmark but has not started her iron tablets yet.  She also has heavy menstrual. This is a very pleasant 30 years old African-American female with severe iron deficiency anemia secondary to gastrointestinal as well as gynecological blood loss. I recommended for the patient to proceed with 1 dose of Feraheme infusion next week. She was also advised to continue on the oral iron tablet as prescribed by Dr. Rush Landmark. I will see her back for follow-up visit in around 6 weeks with repeat CBC, iron study and ferritin.  We will consider the patient for additional iron infusion if needed. She was advised to call immediately if she has any other concerning symptoms in the interval.  Disclaimer: This note was dictated with voice recognition software. Similar sounding words can inadvertently be transcribed and may be missed upon review. The total time spent in the appointment by me was 62 minutes.

## 2020-11-12 NOTE — Discharge Instructions (Signed)
Infliximab injection What is this medicine? INFLIXIMAB (in [place] i mab) is used to treat Crohn's disease and ulcerative colitis. It is also used to treat ankylosing spondylitis, plaque psoriasis, and some forms of arthritis. This medicine may be used for other purposes; ask your health care provider or pharmacist if you have questions. COMMON BRAND NAME(S): AVSOLA, INFLECTRA, Remicade, RENFLEXIS What should I tell my health care provider before I take this medicine? They need to know if you have any of these conditions:  cancer  current or past resident of [place] or [place]  diabetes  exposure to tuberculosis  Guillain-Barre syndrome  heart failure  hepatitis or liver disease  immune system problems  infection  lung or breathing disease, like COPD  multiple sclerosis  receiving phototherapy for the skin  seizure disorder  an unusual or allergic reaction to infliximab, mouse proteins, other medicines, foods, dyes, or preservatives  pregnant or trying to get pregnant  breast-feeding How should I use this medicine? This medicine is for injection into a vein. It is usually given by a health care professional in a hospital or clinic setting. A special MedGuide will be given to you by the pharmacist with each prescription and refill. Be sure to read this information carefully each time. Talk to your pediatrician regarding the use of this medicine in children. While this drug may be prescribed for children as young as 15 years of age for selected conditions, precautions do apply. Overdosage: If you think you have taken too much of this medicine contact a poison control center or emergency room at once. NOTE: This medicine is only for you. Do not share this medicine with others. What if I miss a dose? It is important not to miss your dose. Call your doctor or health care professional if you are unable to keep an appointment. What may interact with this  medicine? Do not take this medicine with any of the following medications:  biologic medicines such as abatacept, adalimumab, anakinra, certolizumab, etanercept, golimumab, rituximab, secukinumab, tocilizumab, tofactinib, ustekinumab  live vaccines This list may not describe all possible interactions. Give your health care provider a list of all the medicines, herbs, non-prescription drugs, or dietary supplements you use. Also tell them if you smoke, drink alcohol, or use illegal drugs. Some items may interact with your medicine. What should I watch for while using this medicine? Your condition will be monitored carefully while you are receiving this medicine. Visit your doctor or health care professional for regular checks on your progress. You may need blood work done while you are taking this medicine. Before beginning therapy, your doctor may do a test to see if you have been exposed to tuberculosis. Call your doctor or health care professional for advice if you get a fever, chills or sore throat, or other symptoms of a cold or flu. Do not treat yourself. This drug decreases your body's ability to fight infections. Try to avoid being around people who are sick. This medicine may make the symptoms of heart failure worse in some patients. If you notice symptoms such as increased shortness of breath or swelling of the ankles or legs, contact your health care provider right away. If you are going to have surgery or dental work, tell your health care professional or dentist that you have received this medicine. If you take this medicine for plaque psoriasis, stay out of the sun. If you cannot avoid being in the sun, wear protective clothing and use sunscreen. Do  not use sun lamps or tanning beds/booths. Talk to your doctor about your risk of cancer. You may be more at risk for certain types of cancers if you take this medicine. What side effects may I notice from receiving this medicine? Side effects  that you should report to your doctor or health care professional as soon as possible:  allergic reactions like skin rash, itching or hives, swelling of the face, lips, or tongue  breathing problems  changes in vision  chest pain  fever or chills, usually related to the infusion  joint pain  pain, tingling, numbness in the hands or feet  redness, blistering, peeling or loosening of the skin, including inside the mouth  seizures  signs of infection - fever or chills, cough, sore throat, flu-like symptoms, pain or difficulty passing urine  signs and symptoms of liver injury like dark yellow or brown urine; general ill feeling; light-colored stools; loss of appetite; nausea; right upper belly pain; unusually weak or tired; yellowing of the eyes or skin  signs and symptoms of a stroke like changes in vision; confusion; trouble speaking or understanding; severe headaches; sudden numbness or weakness of the face, arm or leg; trouble walking; dizziness; loss of balance or coordination  swelling of the ankles, feet, or hands  swollen lymph nodes in the neck, underarm, or groin areas  unusual bleeding or bruising  unusually weak or tired Side effects that usually do not require medical attention (report to your doctor or health care professional if they continue or are bothersome):  headache  nausea  stomach pain  upset stomach This list may not describe all possible side effects. Call your doctor for medical advice about side effects. You may report side effects to FDA at 1-800-FDA-1088. Where should I keep my medicine? This drug is given in a hospital or clinic and will not be stored at home. NOTE: This sheet is a summary. It may not cover all possible information. If you have questions about this medicine, talk to your doctor, pharmacist, or health care provider.  2020 Elsevier/Gold Standard (2016-11-25 13:45:32)

## 2020-11-12 NOTE — Progress Notes (Addendum)
PATIENT CARE CENTER NOTE   Diagnosis: Crohn's disease     Provider: Justice Britain MD     Procedure: Remicade infusion     Note: Patient received Remicaid infusion via PIV. Pt received tylenol and benadryl PO as premedications.  Infusion titrated per protocol. Patient tolerated well with no adverse reaction. Vital signs stable. Pt declined to stay for observation after infusion. Discharge instructions given. Patient to come back every 8 weeks for infusion. Alert, oriented and ambulatory at discharge.

## 2020-11-13 ENCOUNTER — Other Ambulatory Visit: Payer: Self-pay | Admitting: Physician Assistant

## 2020-11-13 DIAGNOSIS — D509 Iron deficiency anemia, unspecified: Secondary | ICD-10-CM

## 2020-11-14 ENCOUNTER — Encounter: Payer: Self-pay | Admitting: Physician Assistant

## 2020-11-14 ENCOUNTER — Inpatient Hospital Stay: Payer: Medicaid Other | Attending: Physician Assistant | Admitting: Physician Assistant

## 2020-11-14 ENCOUNTER — Inpatient Hospital Stay: Payer: Medicaid Other

## 2020-11-14 ENCOUNTER — Other Ambulatory Visit: Payer: Self-pay

## 2020-11-14 VITALS — BP 114/66 | HR 64 | Temp 97.6°F | Resp 17 | Ht 61.0 in | Wt 152.4 lb

## 2020-11-14 DIAGNOSIS — D509 Iron deficiency anemia, unspecified: Secondary | ICD-10-CM | POA: Diagnosis not present

## 2020-11-14 DIAGNOSIS — Z79899 Other long term (current) drug therapy: Secondary | ICD-10-CM | POA: Insufficient documentation

## 2020-11-14 DIAGNOSIS — Z8042 Family history of malignant neoplasm of prostate: Secondary | ICD-10-CM | POA: Insufficient documentation

## 2020-11-14 DIAGNOSIS — F419 Anxiety disorder, unspecified: Secondary | ICD-10-CM | POA: Diagnosis not present

## 2020-11-14 DIAGNOSIS — I10 Essential (primary) hypertension: Secondary | ICD-10-CM | POA: Diagnosis not present

## 2020-11-14 DIAGNOSIS — Z862 Personal history of diseases of the blood and blood-forming organs and certain disorders involving the immune mechanism: Secondary | ICD-10-CM

## 2020-11-14 DIAGNOSIS — Z9049 Acquired absence of other specified parts of digestive tract: Secondary | ICD-10-CM | POA: Diagnosis not present

## 2020-11-14 DIAGNOSIS — D573 Sickle-cell trait: Secondary | ICD-10-CM | POA: Insufficient documentation

## 2020-11-14 DIAGNOSIS — K5090 Crohn's disease, unspecified, without complications: Secondary | ICD-10-CM | POA: Diagnosis not present

## 2020-11-14 DIAGNOSIS — Z793 Long term (current) use of hormonal contraceptives: Secondary | ICD-10-CM | POA: Insufficient documentation

## 2020-11-14 LAB — CMP (CANCER CENTER ONLY)
ALT: 15 U/L (ref 0–44)
AST: 21 U/L (ref 15–41)
Albumin: 3.8 g/dL (ref 3.5–5.0)
Alkaline Phosphatase: 73 U/L (ref 38–126)
Anion gap: 5 (ref 5–15)
BUN: 13 mg/dL (ref 6–20)
CO2: 26 mmol/L (ref 22–32)
Calcium: 9.5 mg/dL (ref 8.9–10.3)
Chloride: 106 mmol/L (ref 98–111)
Creatinine: 0.84 mg/dL (ref 0.44–1.00)
GFR, Estimated: >60 mL/min (ref >60)
Glucose, Bld: 84 mg/dL (ref 70–99)
Potassium: 3.8 mmol/L (ref 3.5–5.1)
Sodium: 137 mmol/L (ref 135–145)
Total Bilirubin: 0.4 mg/dL (ref 0.3–1.2)
Total Protein: 8.3 g/dL — ABNORMAL HIGH (ref 6.5–8.1)

## 2020-11-14 LAB — CBC WITH DIFFERENTIAL (CANCER CENTER ONLY)
Abs Immature Granulocytes: 0.02 10*3/uL (ref 0.00–0.07)
Basophils Absolute: 0.1 10*3/uL (ref 0.0–0.1)
Basophils Relative: 1 %
Eosinophils Absolute: 0.1 10*3/uL (ref 0.0–0.5)
Eosinophils Relative: 2 %
HCT: 32.4 % — ABNORMAL LOW (ref 36.0–46.0)
Hemoglobin: 9.2 g/dL — ABNORMAL LOW (ref 12.0–15.0)
Immature Granulocytes: 0 %
Lymphocytes Relative: 43 %
Lymphs Abs: 3.3 10*3/uL (ref 0.7–4.0)
MCH: 22.3 pg — ABNORMAL LOW (ref 26.0–34.0)
MCHC: 28.4 g/dL — ABNORMAL LOW (ref 30.0–36.0)
MCV: 78.6 fL — ABNORMAL LOW (ref 80.0–100.0)
Monocytes Absolute: 0.3 10*3/uL (ref 0.1–1.0)
Monocytes Relative: 4 %
Neutro Abs: 3.8 10*3/uL (ref 1.7–7.7)
Neutrophils Relative %: 50 %
Platelet Count: 314 10*3/uL (ref 150–400)
RBC: 4.12 MIL/uL (ref 3.87–5.11)
RDW: 23.1 % — ABNORMAL HIGH (ref 11.5–15.5)
WBC Count: 7.5 10*3/uL (ref 4.0–10.5)
nRBC: 0 % (ref 0.0–0.2)

## 2020-11-14 LAB — IRON AND TIBC
Iron: 39 ug/dL — ABNORMAL LOW (ref 41–142)
Saturation Ratios: 9 % — ABNORMAL LOW (ref 21–57)
TIBC: 410 ug/dL (ref 236–444)
UIBC: 371 ug/dL (ref 120–384)

## 2020-11-14 LAB — FERRITIN: Ferritin: 128 ng/mL (ref 11–307)

## 2020-11-14 LAB — VITAMIN B12: Vitamin B-12: 235 pg/mL (ref 180–914)

## 2020-11-14 LAB — FOLATE: Folate: 14.8 ng/mL (ref >5.9)

## 2020-11-14 NOTE — Patient Instructions (Signed)

## 2020-11-19 ENCOUNTER — Encounter (HOSPITAL_COMMUNITY): Payer: Medicaid Other

## 2020-11-19 ENCOUNTER — Other Ambulatory Visit: Payer: Self-pay

## 2020-11-20 ENCOUNTER — Inpatient Hospital Stay: Payer: Medicaid Other

## 2020-11-20 ENCOUNTER — Ambulatory Visit (INDEPENDENT_AMBULATORY_CARE_PROVIDER_SITE_OTHER): Payer: Medicaid Other | Admitting: Internal Medicine

## 2020-11-20 ENCOUNTER — Encounter: Payer: Self-pay | Admitting: Internal Medicine

## 2020-11-20 ENCOUNTER — Telehealth: Payer: Self-pay | Admitting: Internal Medicine

## 2020-11-20 ENCOUNTER — Other Ambulatory Visit (HOSPITAL_COMMUNITY)
Admission: RE | Admit: 2020-11-20 | Discharge: 2020-11-20 | Disposition: A | Discharge status: Home / Self Care | Payer: Medicaid Other | Source: Ambulatory Visit | Attending: Internal Medicine | Admitting: Internal Medicine

## 2020-11-20 ENCOUNTER — Encounter: Payer: Self-pay | Admitting: *Deleted

## 2020-11-20 VITALS — BP 110/60 | HR 72 | Temp 98.9°F | Resp 16 | Ht 62.0 in | Wt 149.4 lb

## 2020-11-20 DIAGNOSIS — R079 Chest pain, unspecified: Secondary | ICD-10-CM

## 2020-11-20 DIAGNOSIS — Z8639 Personal history of other endocrine, nutritional and metabolic disease: Secondary | ICD-10-CM

## 2020-11-20 DIAGNOSIS — Z0000 Encounter for general adult medical examination without abnormal findings: Secondary | ICD-10-CM | POA: Diagnosis not present

## 2020-11-20 DIAGNOSIS — Z13228 Encounter for screening for other metabolic disorders: Secondary | ICD-10-CM

## 2020-11-20 DIAGNOSIS — R102 Pelvic and perineal pain: Secondary | ICD-10-CM

## 2020-11-20 DIAGNOSIS — Z124 Encounter for screening for malignant neoplasm of cervix: Secondary | ICD-10-CM | POA: Insufficient documentation

## 2020-11-20 NOTE — Progress Notes (Signed)
Reports pelvic and low back pain, previously treated for UTI   Wants to discuss starting birth control   Requesting referral to cardiology for recurring chest pains, had normal EKG at [facility] x 2 months ago, had HTN after last pregnancy, reports med help pain but does not have anymore of the medication

## 2020-11-20 NOTE — Telephone Encounter (Signed)
Scheduled appt per 1/12 sch msg  - no answer. Left message for patient to call back to reschedule appt.

## 2020-11-20 NOTE — Progress Notes (Signed)
Subjective:    Erika Stephens - 30 y.o. female MRN [id number]  Date of birth: 01-05-1991  HPI  Erika Stephens is here for annual exam. Has a history of Crohn's disease and subsequent iron deficiency anemia.   Has concerns about recurrent pelvic pain that occurs about once per week. Not severe. Just feels achy low in the pelvis. Was treated for presumed UTI in Nov. Denies dysuria, vaginal discharge, nausea, vomiting. Menses are regular.   Also has concerns about recurrent chest pain. Feels aching across chest, particularly left sided. Has been urgent care for this concern in the recent past due to concern she was having a heart ache. Pain seems to be reproducible if she wears tight fitting bras, presses on the area. No pain with inspiration. Denies associated SOB, DOE, diaphoresis, pain radiating to arms. No family history of sudden cardiac arrest at young age. Patient reports history of PP Pre-E, otherwise no significant cardiac history. Never smoker. Endorses once per week THC use.      Health Maintenance:  Health Maintenance Due  Topic Date Due  . PAP SMEAR-Modifier  Never done    -  reports that she has never smoked. She has never used smokeless tobacco. - Review of Systems: Per HPI. - Past Medical History: Patient Active Problem List   Diagnosis Date Noted  . Iron deficiency anemia 11/14/2020  . Fecal urgency 10/16/2020  . Nasal sore 10/16/2020  . Crohn's disease of both small and large intestine with other complication ([place]) 24/26/8341  . History of iron deficiency anemia 03/08/2020  . Infliximab (Remicade) long-term use 03/08/2020  . History of immunosuppression therapy 03/08/2020  . Generalized abdominal pain 03/08/2020  . Bloating symptom 03/08/2020  . Chronic diarrhea 03/08/2020   - Medications: reviewed and updated   Objective:   Physical Exam BP 110/60 (BP Location: Right Arm, Patient Position: Sitting, Cuff Size: Normal)   Pulse 72   Temp 98.9 F (37.2 C) (Oral)    Resp 16   Ht 5' 2"  (1.575 m)   Wt 149 lb 6.4 oz (67.8 kg)   SpO2 99%   BMI 27.33 kg/m  Physical Exam Constitutional:      Appearance: She is not diaphoretic.  HENT:     Head: Normocephalic and atraumatic.     Mouth/Throat:     Mouth: Oropharynx is clear and moist.      Comments: TMs normal bilaterally Eyes:     Extraocular Movements: EOM normal.     Conjunctiva/sclera: Conjunctivae normal.     Pupils: Pupils are equal, round, and reactive to light.  Neck:     Thyroid: No thyromegaly.  Cardiovascular:     Rate and Rhythm: Normal rate and regular rhythm.     Pulses: Intact distal pulses.     Heart sounds: Normal heart sounds. No murmur heard.   Pulmonary:     Effort: Pulmonary effort is normal. No respiratory distress.     Breath sounds: Normal breath sounds. No wheezing.  Abdominal:     General: Bowel sounds are normal. There is no distension.     Palpations: Abdomen is soft.     Tenderness: There is no abdominal tenderness. There is no guarding or rebound.  Genitourinary:    Comments: GU/GYN: Exam performed in the presence of a chaperone. External genitalia within normal limits.  Vaginal mucosa pink, moist, normal rugae.  Nonfriable cervix without lesions, no discharge or bleeding noted on speculum exam.  Bimanual exam revealed normal, nongravid uterus.  No cervical  motion tenderness. No adnexal masses bilaterally.    Musculoskeletal:        General: No deformity or edema. Normal range of motion.     Cervical back: Normal range of motion and neck supple.  Lymphadenopathy:     Cervical: No cervical adenopathy.  Skin:    General: Skin is warm and dry.     Findings: No rash.  Neurological:     Mental Status: She is alert and oriented to person, place, and time.     Gait: Gait is intact.  Psychiatric:        Mood and Affect: Mood and affect normal.        Judgment: Judgment normal.            Assessment & Plan:   1. Encounter for annual physical  exam Counseled on 150 minutes of exercise per week, healthy eating (including decreased daily intake of saturated fats, cholesterol, added sugars, sodium), STI prevention, routine healthcare maintenance.  2. Pelvic pain Discussed that this may be related to her Crohn's disease. Pelvic exam is benign. Will obtain vaginal cultures and urine studies to rule out other causes. Discussed if pain worsens, could consider pelvic imaging in the future. Patient denies change of pregnancy/declines test.  - Urinalysis, Routine w reflex microscopic - Urine Culture - Cervicovaginal ancillary only  3. Chest pain, unspecified type Suspect patient is worried well. Reviewed has had two prior normal EKGs. Cardiac and lung exam normal. Has no significant cardiac risk factors. Suspect given reproducibility on exam that is msk in nature and discussed supportive care. Patient does have history of iron deficiency anemia which could also theoretically contribute although reviewed that recent HgB was above 9.  - Ambulatory referral to Cardiology  4. History of iron deficiency Has iron infusion scheduled.   5. Screening for metabolic disorder - Comprehensive metabolic panel - Lipid panel  6. Screening for cervical cancer - Cytology - PAP([place])     Phill Myron, D.O. 11/20/2020, 10:00 AM Primary Care at [facility]

## 2020-11-21 LAB — COMPREHENSIVE METABOLIC PANEL
ALT: 11 IU/L (ref 0–32)
AST: 21 IU/L (ref 0–40)
Albumin/Globulin Ratio: 1.4 (ref 1.2–2.2)
Albumin: 4.5 g/dL (ref 3.9–5.0)
Alkaline Phosphatase: 77 IU/L (ref 44–121)
BUN/Creatinine Ratio: 15 (ref 9–23)
BUN: 13 mg/dL (ref 6–20)
Bilirubin Total: 0.2 mg/dL (ref 0.0–1.2)
CO2: 22 mmol/L (ref 20–29)
Calcium: 9.4 mg/dL (ref 8.7–10.2)
Chloride: 104 mmol/L (ref 96–106)
Creatinine, Ser: 0.89 mg/dL (ref 0.57–1.00)
GFR calc Af Amer: 101 mL/min/{1.73_m2} (ref >59)
GFR calc non Af Amer: 87 mL/min/{1.73_m2} (ref >59)
Globulin, Total: 3.3 g/dL (ref 1.5–4.5)
Glucose: 76 mg/dL (ref 65–99)
Potassium: 4 mmol/L (ref 3.5–5.2)
Sodium: 139 mmol/L (ref 134–144)
Total Protein: 7.8 g/dL (ref 6.0–8.5)

## 2020-11-21 LAB — URINALYSIS, ROUTINE W REFLEX MICROSCOPIC
Bilirubin, UA: NEGATIVE
Glucose, UA: NEGATIVE
Ketones, UA: NEGATIVE
Leukocytes,UA: NEGATIVE
Nitrite, UA: NEGATIVE
Specific Gravity, UA: 1.025 (ref 1.005–1.030)
Urobilinogen, Ur: 0.2 mg/dL (ref 0.2–1.0)
pH, UA: 5.5 (ref 5.0–7.5)

## 2020-11-21 LAB — CERVICOVAGINAL ANCILLARY ONLY
Bacterial Vaginitis (gardnerella): POSITIVE — AB
Candida Glabrata: NEGATIVE
Candida Vaginitis: NEGATIVE
Chlamydia: NEGATIVE
Comment: NEGATIVE
Comment: NEGATIVE
Comment: NEGATIVE
Comment: NEGATIVE
Comment: NEGATIVE
Comment: NORMAL
Neisseria Gonorrhea: NEGATIVE
Trichomonas: NEGATIVE

## 2020-11-21 LAB — MICROSCOPIC EXAMINATION
Bacteria, UA: NONE SEEN
Casts: NONE SEEN /lpf

## 2020-11-21 LAB — LIPID PANEL
Chol/HDL Ratio: 3.1 ratio (ref 0.0–4.4)
Cholesterol, Total: 160 mg/dL (ref 100–199)
HDL: 52 mg/dL (ref >39)
LDL Chol Calc (NIH): 89 mg/dL (ref 0–99)
Triglycerides: 102 mg/dL (ref 0–149)
VLDL Cholesterol Cal: 19 mg/dL (ref 5–40)

## 2020-11-22 ENCOUNTER — Other Ambulatory Visit: Payer: Self-pay | Admitting: Internal Medicine

## 2020-11-22 LAB — CYTOLOGY - PAP
Comment: NEGATIVE
Diagnosis: UNDETERMINED — AB
High risk HPV: NEGATIVE

## 2020-11-22 LAB — URINE CULTURE

## 2020-11-22 MED ORDER — METRONIDAZOLE 500 MG PO TABS: 500.0000 mg | ORAL_TABLET | Freq: Two times a day (BID) | ORAL | 0 refills | Status: DC

## 2020-11-23 ENCOUNTER — Other Ambulatory Visit: Payer: Self-pay | Admitting: Gastroenterology

## 2020-11-27 ENCOUNTER — Encounter: Payer: Self-pay | Admitting: Internal Medicine

## 2020-11-27 DIAGNOSIS — R87610 Atypical squamous cells of undetermined significance on cytologic smear of cervix (ASC-US): Secondary | ICD-10-CM | POA: Insufficient documentation

## 2020-12-05 ENCOUNTER — Other Ambulatory Visit: Payer: Self-pay

## 2020-12-05 ENCOUNTER — Telehealth (INDEPENDENT_AMBULATORY_CARE_PROVIDER_SITE_OTHER): Payer: Medicaid Other | Admitting: Internal Medicine

## 2020-12-05 DIAGNOSIS — N760 Acute vaginitis: Secondary | ICD-10-CM | POA: Diagnosis not present

## 2020-12-05 DIAGNOSIS — Z30015 Encounter for initial prescription of vaginal ring hormonal contraceptive: Secondary | ICD-10-CM

## 2020-12-05 DIAGNOSIS — B9689 Other specified bacterial agents as the cause of diseases classified elsewhere: Secondary | ICD-10-CM

## 2020-12-05 MED ORDER — ETONOGESTREL-ETHINYL ESTRADIOL 0.12-0.015 MG/24HR VA RING: VAGINAL_RING | VAGINAL | 12 refills | Status: DC

## 2020-12-05 MED ORDER — METRONIDAZOLE 500 MG PO TABS: 500.0000 mg | ORAL_TABLET | Freq: Two times a day (BID) | ORAL | 0 refills | Status: DC

## 2020-12-05 NOTE — Progress Notes (Unsigned)
Virtual Visit via Telephone Note  I connected with Erika Stephens, on 12/05/2020 at 2:54 PM by telephone due to the COVID-19 pandemic and verified that I am speaking with the correct person using two identifiers.   Consent: I discussed the limitations, risks, security and privacy concerns of performing an evaluation and management service by telephone and the availability of in person appointments. I also discussed with the patient that there may be a patient responsible charge related to this service. The patient expressed understanding and agreed to proceed.   Location of Patient: Home   Location of Provider: Clinic    Persons participating in Telemedicine visit: Shandricka Meinecke Cyndie Mull Dr. Juleen China      History of Present Illness: Patient has a visit for acute concerns. She was diagnosed with BV at visit 2 weeks ago. Reports that she lost the Flagyl and was never able to take Rx. Asks to have prescribed again.   Would also like to start on birth control again. She asks for Nuvaring--used it in past and did well with it. Last used a couple years ago. No history of migraines with aura or VTE. Never cigarette smoker.    Past Medical History:  Diagnosis Date  . Anemia   . Anxiety   . Blood transfusion without reported diagnosis   . Central centrifugal scarring alopecia   . Chronic swimmer's ear of right side   . Crohn's colitis ([place])   . Hypertension    Phreesia 09/10/2020  . IDA (iron deficiency anemia)   . Impetigo   . Postinflammatory hyperpigmentation   . Scalp psoriasis   . Seborrheic dermatitis of scalp    Allergies  Allergen Reactions  . Adalimumab Swelling  . Aspirin Hives    Current Outpatient Medications on File Prior to Visit  Medication Sig Dispense Refill  . cholestyramine (QUESTRAN) 4 g packet Take 1 packet (4 g total) by mouth 3 (three) times daily with meals. (Patient not taking: No sig reported) 60 each 3  . etonogestrel-ethinyl estradiol  (NUVARING) 0.12-0.015 MG/24HR vaginal ring  (Patient not taking: No sig reported)    . ferrous gluconate (FERGON) 324 MG tablet TAKE 1 TABLET BY MOUTH DAILY WITH BREAKFAST 30 tablet 0  . inFLIXimab (REMICADE IV) Inject into the vein. Every 8 weeks (Patient not taking: No sig reported)    . metroNIDAZOLE (FLAGYL) 500 MG tablet Take 1 tablet (500 mg total) by mouth 2 (two) times daily. 14 tablet 0  . Multiple Vitamin (MULTIVITAMIN ADULT PO) Take by mouth daily.    . Multiple Vitamins-Minerals (HAIR SKIN AND NAILS FORMULA) TABS Take 1 tablet by mouth daily. (Patient not taking: No sig reported)     No current facility-administered medications on file prior to visit.    Observations/Objective: NAD. Speaking clearly.  Work of breathing normal.  Alert and oriented. Mood appropriate.   Assessment and Plan: 1. Bacterial vaginosis - metroNIDAZOLE (FLAGYL) 500 MG tablet; Take 1 tablet (500 mg total) by mouth 2 (two) times daily.  Dispense: 14 tablet; Refill: 0  2. Encounter for initial prescription of vaginal ring hormonal contraceptive No contraindications to estrogen containing birth control. Discussed how to use contraception, options for stating, need for back up method. Discussed does not prevent against STDs.  - etonogestrel-ethinyl estradiol (NUVARING) 0.12-0.015 MG/24HR vaginal ring; Insert vaginally and leave in place for 3 consecutive weeks, then remove for 1 week.  Dispense: 1 each; Refill: 12   Follow Up Instructions: PRN and for routine care  I discussed the assessment and treatment plan with the patient. The patient was provided an opportunity to ask questions and all were answered. The patient agreed with the plan and demonstrated an understanding of the instructions.   The patient was advised to call back or seek an in-person evaluation if the symptoms worsen or if the condition fails to improve as anticipated.     I provided 11 minutes total of non-face-to-face time during  this encounter including median intraservice time, reviewing previous notes, investigations, ordering medications, medical decision making, coordinating care and patient verbalized understanding at the end of the visit.    Phill Myron, D.O. Primary Care at [facility]  12/05/2020, 2:54 PM

## 2020-12-06 ENCOUNTER — Telehealth: Payer: Self-pay | Admitting: Internal Medicine

## 2020-12-06 NOTE — Telephone Encounter (Signed)
Called pt per 1/24 sch msg - no answer and vmail full

## 2020-12-21 ENCOUNTER — Other Ambulatory Visit: Payer: Self-pay | Admitting: Gastroenterology

## 2020-12-24 ENCOUNTER — Other Ambulatory Visit: Payer: Self-pay | Admitting: Gastroenterology

## 2020-12-24 ENCOUNTER — Other Ambulatory Visit (INDEPENDENT_AMBULATORY_CARE_PROVIDER_SITE_OTHER): Payer: Medicaid Other

## 2020-12-24 ENCOUNTER — Ambulatory Visit: Payer: Medicaid Other | Admitting: Gastroenterology

## 2020-12-24 ENCOUNTER — Encounter: Payer: Self-pay | Admitting: Gastroenterology

## 2020-12-24 VITALS — BP 110/70 | HR 78 | Ht 61.0 in | Wt 152.0 lb

## 2020-12-24 DIAGNOSIS — K5010 Crohn's disease of large intestine without complications: Secondary | ICD-10-CM | POA: Diagnosis not present

## 2020-12-24 DIAGNOSIS — K50818 Crohn's disease of both small and large intestine with other complication: Secondary | ICD-10-CM | POA: Diagnosis not present

## 2020-12-24 DIAGNOSIS — K529 Noninfective gastroenteritis and colitis, unspecified: Secondary | ICD-10-CM | POA: Diagnosis not present

## 2020-12-24 DIAGNOSIS — K649 Unspecified hemorrhoids: Secondary | ICD-10-CM | POA: Diagnosis not present

## 2020-12-24 DIAGNOSIS — D509 Iron deficiency anemia, unspecified: Secondary | ICD-10-CM

## 2020-12-24 DIAGNOSIS — D849 Immunodeficiency, unspecified: Secondary | ICD-10-CM | POA: Diagnosis not present

## 2020-12-24 DIAGNOSIS — Z9225 Personal history of immunosupression therapy: Secondary | ICD-10-CM | POA: Diagnosis not present

## 2020-12-24 LAB — CBC
HCT: 31.4 % — ABNORMAL LOW (ref 36.0–46.0)
Hemoglobin: 9.7 g/dL — ABNORMAL LOW (ref 12.0–15.0)
MCHC: 31 g/dL (ref 30.0–36.0)
MCV: 72.5 fl — ABNORMAL LOW (ref 78.0–100.0)
Platelets: 309 10*3/uL (ref 150.0–400.0)
RBC: 4.33 Mil/uL (ref 3.87–5.11)
RDW: 20.9 % — ABNORMAL HIGH (ref 11.5–15.5)
WBC: 10.1 10*3/uL (ref 4.0–10.5)

## 2020-12-24 LAB — IBC + FERRITIN
Ferritin: 4.1 ng/mL — ABNORMAL LOW (ref 10.0–291.0)
Iron: 17 ug/dL — ABNORMAL LOW (ref 42–145)
Saturation Ratios: 3.4 % — ABNORMAL LOW (ref 20.0–50.0)
Transferrin: 361 mg/dL — ABNORMAL HIGH (ref 212.0–360.0)

## 2020-12-24 LAB — FOLATE: Folate: 23.2 ng/mL (ref >5.9)

## 2020-12-24 LAB — VITAMIN B12: Vitamin B-12: 253 pg/mL (ref 211–911)

## 2020-12-24 MED ORDER — HYDROCORTISONE ACETATE 25 MG RE SUPP: RECTAL | 0 refills | Status: DC

## 2020-12-24 NOTE — Patient Instructions (Signed)
Your provider has requested that you go to the basement level for lab work before leaving today. Press "B" on the elevator. The lab is located at the first door on the left as you exit the elevator.  Conisder VSL3 as a different probiotic.   Try using Cholestyramine 1/2 packet daily.   Follow up in 4 months. Office will call with an appointment.   Due to recent changes in healthcare laws, you may see the results of your imaging and laboratory studies on MyChart before your provider has had a chance to review them.  We understand that in some cases there may be results that are confusing or concerning to you. Not all laboratory results come back in the same time frame and the provider may be waiting for multiple results in order to interpret others.  Please give [place] 48 hours in order for your provider to thoroughly review all the results before contacting the office for clarification of your results.   Thank you for choosing me and [facility].  Dr. Rush Landmark

## 2020-12-24 NOTE — Progress Notes (Signed)
Da

## 2020-12-25 ENCOUNTER — Inpatient Hospital Stay: Payer: Medicaid Other | Admitting: Internal Medicine

## 2020-12-25 ENCOUNTER — Encounter: Payer: Self-pay | Admitting: Internal Medicine

## 2020-12-25 ENCOUNTER — Ambulatory Visit (INDEPENDENT_AMBULATORY_CARE_PROVIDER_SITE_OTHER): Payer: Medicaid Other | Admitting: Internal Medicine

## 2020-12-25 ENCOUNTER — Encounter: Payer: Self-pay | Admitting: Gastroenterology

## 2020-12-25 ENCOUNTER — Inpatient Hospital Stay: Payer: Medicaid Other | Attending: Physician Assistant

## 2020-12-25 ENCOUNTER — Other Ambulatory Visit: Payer: Self-pay

## 2020-12-25 VITALS — BP 110/62 | HR 72 | Ht 61.0 in | Wt 152.0 lb

## 2020-12-25 DIAGNOSIS — I10 Essential (primary) hypertension: Secondary | ICD-10-CM

## 2020-12-25 DIAGNOSIS — Z8759 Personal history of other complications of pregnancy, childbirth and the puerperium: Secondary | ICD-10-CM | POA: Diagnosis not present

## 2020-12-25 DIAGNOSIS — R079 Chest pain, unspecified: Secondary | ICD-10-CM | POA: Diagnosis not present

## 2020-12-25 NOTE — Progress Notes (Signed)
Cardiology Office Note:    Date:  12/25/2020   ID:  Erika Stephens, DOB 12/16/1990, MRN [id number]  PCP:  [name], DO   [facility]  Cardiologist:  No primary care provider on file.  Advanced Practice Provider:  No care team member to display Electrophysiologist:  None       CC: chest pain Consulted for the evaluation of chest pain at the behest of [name], DO  History of Present Illness:    Erika Stephens is a 30 y.o. female with a hx of pre-eclampsia with third child, IDA who presents for evaluation 12/25/20.  Patient notes that she is feeling chest pain.  Since March 2020 (post partum).  Patient notes that she has been having left sided chest pain. Chest pain goes town her arm.  Feel like a strong pain.  Discomfort occurs with driving or spontaneous,  and improves with deep breathing and her old anti-hypertensive.  Unclear when her BP is with it.  Patient exertion notable taking care of the kids and feels no symptoms unless they are acting up.  No shortness of breath, DOE .  No PND or orthopnea.  No bendopnea, weight gain, leg swelling , or abdominal swelling.  No syncope or near syncope . Notes  no palpitations or funny heart beats.     Notes marijuana use doesn't make her chest pain any different.  Patient brings her youngest son today (66 years old).  Past Medical History:  Diagnosis Date  . Anemia   . Anxiety   . Blood transfusion without reported diagnosis   . Central centrifugal scarring alopecia   . Chronic swimmer's ear of right side   . Crohn's colitis ([place])   . Hypertension    Phreesia 09/10/2020  . IDA (iron deficiency anemia)   . Impetigo   . Postinflammatory hyperpigmentation   . Scalp psoriasis   . Seborrheic dermatitis of scalp     Past Surgical History:  Procedure Laterality Date  . APPENDECTOMY    . BOWEL RESECTION    . COLONOSCOPY    . UPPER GASTROINTESTINAL ENDOSCOPY      Current  Medications: No outpatient medications have been marked as taking for the 12/25/20 encounter (Office Visit) with Werner Lean, MD.     Allergies:   Adalimumab and Aspirin   Social History   Socioeconomic History  . Marital status: Unknown    Spouse name: Not on file  . Number of children: Not on file  . Years of education: Not on file  . Highest education level: Not on file  Occupational History  . Not on file  Tobacco Use  . Smoking status: Never Smoker  . Smokeless tobacco: Never Used  Vaping Use  . Vaping Use: Never used  Substance and Sexual Activity  . Alcohol use: Yes    Comment: socially.   . Drug use: Yes    Types: Marijuana    Comment: last used 04-17-20 per pt  . Sexual activity: Not Currently  Other Topics Concern  . Not on file  Social History Narrative  . Not on file   Social Determinants of Health   Financial Resource Strain: Not on file  Food Insecurity: Not on file  Transportation Needs: Not on file  Physical Activity: Not on file  Stress: Not on file  Social Connections: Not on file    Social:  Has three children  Family History: The patient's family history includes Bipolar disorder in  her father; Crohn's disease in her maternal grandmother and mother; Prostate cancer in her maternal uncle; Schizophrenia in her father. There is no history of Colon cancer, Stomach cancer, Pancreatic cancer, Esophageal cancer, Inflammatory bowel disease, Liver disease, or Rectal cancer.  ROS:   Please see the history of present illness.     All other systems reviewed and are negative.  EKGs/Labs/Other Studies Reviewed:    The following studies were reviewed today:  EKG:   12/25/20: SR 68 WNL  Recent Labs: 03/08/2020: Magnesium 1.8; TSH 0.90 11/20/2020: ALT 11; BUN 13; Creatinine, Ser 0.89; Potassium 4.0; Sodium 139 12/24/2020: Hemoglobin 9.7; Platelets 309.0  Recent Lipid Panel    Component Value Date/Time   CHOL 160 11/20/2020 1012   TRIG 102  11/20/2020 1012   HDL 52 11/20/2020 1012   CHOLHDL 3.1 11/20/2020 1012   LDLCALC 89 11/20/2020 1012     Risk Assessment/Calculations:     N/A  Physical Exam:    VS:  BP 110/62   Pulse 72   Ht 5' 1"  (1.549 m)   Wt 152 lb (68.9 kg)   SpO2 99%   BMI 28.72 kg/m     Repeat BP R arm Manual: 110/60  Wt Readings from Last 3 Encounters:  12/25/20 152 lb (68.9 kg)  12/24/20 152 lb (68.9 kg)  11/20/20 149 lb 6.4 oz (67.8 kg)     GEN:  Well nourished, well developed in no acute distress HEENT: Normal NECK: No JVD; No carotid bruits LYMPHATICS: No lymphadenopathy CARDIAC: RRR, no murmurs, rubs, gallops RESPIRATORY:  Clear to auscultation without rales, wheezing or rhonchi  ABDOMEN: Soft, non-tender, non-distended MUSCULOSKELETAL:  No edema; No deformity  SKIN: Warm and dry NEUROLOGIC:  Alert and oriented x 3 PSYCHIATRIC:  Normal affect   ASSESSMENT:    1. Chest pain of uncertain etiology   2. Essential hypertension   3. History of pre-eclampsia    PLAN:    In order of problems listed above:  Chest Pain Syndrome with Pr-eclampsia history - Possibly related to elevated blood pressure - EKG  without evidence of accessory pathway, ventricular pacing, digoxin use, LBBB, or baseline ST changes. - Would recommend exercise stress test (NPO at midnight); discussed risks, benefits, and alternatives of the diagnostic procedure including chest pain, arrhythmia, and death.  Patient amenable for testing.  History of pre-eclampsia Hypertension - ambulatory blood pressure not done, will start ambulatory BP monitoring; gave education on how to perform ambulatory blood pressure monitoring including the frequency and technique; goal ambulatory blood pressure < 135/85 on average - discussed diet (DASH/low sodium), and exercise/weight loss interventions  PRN follow up unless new symptoms or abnormal test results warranting change in plan  Would be reasonable for  APP Follow  up   Shared Decision Making/Informed Consent The risks [chest pain, shortness of breath, cardiac arrhythmias, dizziness, blood pressure fluctuations, myocardial infarction, stroke/transient ischemic attack, and life-threatening complications (estimated to be 1 in 10,000)], benefits (risk stratification, diagnosing coronary artery disease, treatment guidance) and alternatives of an exercise tolerance test were discussed in detail with Erika Stephens and she agrees to proceed.      Medication Adjustments/Labs and Tests Ordered: Current medicines are reviewed at length with the patient today.  Concerns regarding medicines are outlined above.  Orders Placed This Encounter  Procedures  . Cardiac Stress Test: Informed Consent Details: Physician/Practitioner Attestation; Transcribe to consent form and obtain patient signature  . EXERCISE TOLERANCE TEST (ETT)   No orders of the defined types were  placed in this encounter.   Patient Instructions  Medication Instructions:  Your physician recommends that you continue on your current medications as directed. Please refer to the Current Medication list given to you today.  *If you need a refill on your cardiac medications before your next appointment, please call your pharmacy*   Lab Work: NONE If you have labs (blood work) drawn today and your tests are completely normal, you will receive your results only by: Marland Kitchen MyChart Message (if you have MyChart) OR . A paper copy in the mail If you have any lab test that is abnormal or we need to change your treatment, we will call you to review the results.   Testing/Procedures: Your physician has requested that you have an exercise tolerance test. Please also follow instruction sheet, as given.      Follow-Up: Follow up based on results of Stress Test At [facility], you and your health needs are our priority.  As part of our continuing mission to provide you with exceptional heart care, we have  created designated Provider Care Teams.  These Care Teams include your primary Cardiologist (physician) and Advanced Practice Providers (APPs -  Physician Assistants and Nurse Practitioners) who all work together to provide you with the care you need, when you need it.  We recommend signing up for the patient portal called "MyChart".  Sign up information is provided on this After Visit Summary.  MyChart is used to connect with patients for Virtual Visits (Telemedicine).  Patients are able to view lab/test results, encounter notes, upcoming appointments, etc.  Non-urgent messages can be sent to your provider as well.   To learn more about what you can do with MyChart, go to [email or web address].         Provider:   You may see Gasper Sells, MD or one of the following Advanced Practice Providers on your designated Care Team:    Melina Copa, PA-C  Ermalinda Barrios, PA-C          Signed, Werner Lean, MD  12/25/2020 12:25 PM    [place]

## 2020-12-25 NOTE — Patient Instructions (Signed)
Medication Instructions:  Your physician recommends that you continue on your current medications as directed. Please refer to the Current Medication list given to you today.  *If you need a refill on your cardiac medications before your next appointment, please call your pharmacy*   Lab Work: NONE If you have labs (blood work) drawn today and your tests are completely normal, you will receive your results only by: Marland Kitchen MyChart Message (if you have MyChart) OR . A paper copy in the mail If you have any lab test that is abnormal or we need to change your treatment, we will call you to review the results.   Testing/Procedures: Your physician has requested that you have an exercise tolerance test. Please also follow instruction sheet, as given.      Follow-Up: Follow up based on results of Stress Test At [facility], you and your health needs are our priority.  As part of our continuing mission to provide you with exceptional heart care, we have created designated Provider Care Teams.  These Care Teams include your primary Cardiologist (physician) and Advanced Practice Providers (APPs -  Physician Assistants and Nurse Practitioners) who all work together to provide you with the care you need, when you need it.  We recommend signing up for the patient portal called "MyChart".  Sign up information is provided on this After Visit Summary.  MyChart is used to connect with patients for Virtual Visits (Telemedicine).  Patients are able to view lab/test results, encounter notes, upcoming appointments, etc.  Non-urgent messages can be sent to your provider as well.   To learn more about what you can do with MyChart, go to [email or web address].         Provider:   You may see Gasper Sells, MD or one of the following Advanced Practice Providers on your designated Care Team:    Melina Copa, PA-C  Ermalinda Barrios, PA-C

## 2020-12-25 NOTE — Progress Notes (Signed)
[facility] VISIT   Primary Care Provider [name], [place], [place] [address] [phone]  Patient Profile: Erika Stephens is a 30 y.o. female with a pmh significant for Crohn's disease (diagnosed in 2011 and reinitiated on Remicade), status post appendectomy and ileocecectomy, anal fissures, possible psoriasis, MDD/anxiety, iron deficiency anemia, status post 3 pregnancies.  The patient presents to the [facility] for an evaluation and management of problem(s) noted below:  Problem List 1. Crohn's disease of both small and large intestine with other complication ([place])   2. Iron deficiency anemia, unspecified iron deficiency anemia type   3. Chronic diarrhea   4. Hemorrhoids, unspecified hemorrhoid type   5. History of immunosuppression therapy     History of Present Illness Please see initial consultation note for full details of HPI.    Interval History The patient returns for a scheduled follow-up.  She did well with the initial IV iron infusion but has not been set up for her second iron infusion as of yet.  Her pica symptoms are improved but she still has some fatigue.  It looks like she has had some difficulty with getting scheduled for follow-up with our hematology group.  With that being said she continues on Remicade infusions and is doing well.  We initiated her on cholestyramine which was helpful but actually formed her stools too significantly.  Even just taking cholestyramine once a day she was having significant bulking of her stool that led to hemorrhoidal discomfort.  She had hemorrhoidal discomfort for the next week and 1/2 to 2 weeks and subsequently stopped taking her cholestyramine.  Since stopping cholestyramine she has had some mild recurrence of the softer more loose stools but overall is doing better.  She is not having any other psoriasis-like issues from a skin perspective.  She has gained  some weight.  The previous abdominal cramping and discomfort that she was experiencing is not occurring at this time.  She is not taking any steroids.  She states that another member her of her family was recently diagnosed with Crohn's disease and is taking a different biologic agent.  She has never been on an immunomodulator such as 6-MP or azathioprine.  BMs per day -between 1 and 3 Nocturnal BMs -infrequent Blood -none Mucous -none Tenesmus -none Urgency -infrequent Skin Manifestations -no new psoriatic-like manifestations Eye Manifestations -none Joint Manifestations -none  GI Review of Systems Positive as above Negative for dysphagia, odynophagia, nausea, vomiting, pain, melena, hematochezia  Review of Systems General: Denies fevers/chills/weight loss unintentionally HEENT: Denies oral lesions Cardiovascular: Denies chest pain/palpitations Pulmonary: Denies shortness of breath Gastroenterological: See HPI Genitourinary: She states that she has been having some urinary discomfort at times and is working with her PCP about this; denies darkened urine or hematuria Hematological: Denies easy bruising/bleeding Dermatological: Prior scalp psoriasis versus fungal infection has improved; denies jaundice Psychological: Mood is anxious but stable   Medications Current Outpatient Medications  Medication Sig Dispense Refill  . cholestyramine (QUESTRAN) 4 g packet Take 1 packet (4 g total) by mouth 3 (three) times daily with meals. (Patient taking differently: Take 4 g by mouth 3 (three) times daily with meals as needed.) 60 each 3  . etonogestrel-ethinyl estradiol (NUVARING) 0.12-0.015 MG/24HR vaginal ring Insert vaginally and leave in place for 3 consecutive weeks, then remove for 1 week. 1 each 12  . ferrous gluconate (FERGON) 324 MG tablet TAKE 1 TABLET BY MOUTH EVERY DAY WITH BREAKFAST 30 tablet 0  .  hydrocortisone (ANUSOL-HC) 25 MG suppository Place 1 suppository rectal every night  for 7 days. 12 suppository 0  . inFLIXimab (REMICADE IV) Inject into the vein. Every 8 weeks    . Multiple Vitamin (MULTIVITAMIN ADULT PO) Take by mouth daily.    . Multiple Vitamins-Minerals (HAIR SKIN AND NAILS FORMULA) TABS Take 1 tablet by mouth daily.    Marland Kitchen amoxicillin (AMOXIL) 500 MG capsule Take 500 mg by mouth 3 (three) times daily.     No current facility-administered medications for this visit.    Allergies Allergies  Allergen Reactions  . Adalimumab Swelling  . Aspirin Hives    Histories Past Medical History:  Diagnosis Date  . Anemia   . Anxiety   . Blood transfusion without reported diagnosis   . Central centrifugal scarring alopecia   . Chronic swimmer's ear of right side   . Crohn's colitis ([place])   . Hypertension    Phreesia 09/10/2020  . IDA (iron deficiency anemia)   . Impetigo   . Postinflammatory hyperpigmentation   . Scalp psoriasis   . Seborrheic dermatitis of scalp    Past Surgical History:  Procedure Laterality Date  . APPENDECTOMY    . BOWEL RESECTION    . COLONOSCOPY    . UPPER GASTROINTESTINAL ENDOSCOPY     Social History   Socioeconomic History  . Marital status: Unknown    Spouse name: Not on file  . Number of children: Not on file  . Years of education: Not on file  . Highest education level: Not on file  Occupational History  . Not on file  Tobacco Use  . Smoking status: Never Smoker  . Smokeless tobacco: Never Used  Vaping Use  . Vaping Use: Never used  Substance and Sexual Activity  . Alcohol use: Yes    Comment: socially.   . Drug use: Yes    Types: Marijuana    Comment: last used 04-17-20 per pt  . Sexual activity: Not Currently  Other Topics Concern  . Not on file  Social History Narrative  . Not on file   Social Determinants of Health   Financial Resource Strain: Not on file  Food Insecurity: Not on file  Transportation Needs: Not on file  Physical Activity: Not on file  Stress: Not on file  Social Connections:  Not on file  Intimate Partner Violence: Not on file   Family History  Problem Relation Age of Onset  . Crohn's disease Mother   . Schizophrenia Father   . Bipolar disorder Father   . Crohn's disease Maternal Grandmother   . Prostate cancer Maternal Uncle   . Colon cancer Neg Hx   . Stomach cancer Neg Hx   . Pancreatic cancer Neg Hx   . Esophageal cancer Neg Hx   . Inflammatory bowel disease Neg Hx   . Liver disease Neg Hx   . Rectal cancer Neg Hx    I have reviewed her medical, social, and family history in detail and updated the electronic medical record as necessary.    PHYSICAL EXAMINATION  BP 110/70 (BP Location: Left Arm, Patient Position: Sitting)   Pulse 78   Ht 5' 1"  (1.549 m)   Wt 152 lb (68.9 kg)   SpO2 99%   BMI 28.72 kg/m  Wt Readings from Last 3 Encounters:  12/25/20 152 lb (68.9 kg)  12/24/20 152 lb (68.9 kg)  11/20/20 149 lb 6.4 oz (67.8 kg)  GEN: NAD, appears stated age, doesn't appear chronically ill,  accompanied by toddler son PSYCH: Cooperative, without pressured speech EYE: Conjunctivae pink, sclerae anicteric ENT: Masked CV: Nontachycardic RESP: No audible wheezing GI: NABS, soft, nontender, no rebound or guarding, surgical scars present from previous surgery MSK/EXT: No lower extremity edema SKIN: Tattoos present; no jaundice NEURO:  Alert & Oriented x 3, no focal deficits   REVIEW OF DATA  I reviewed the following data at the time of this encounter:  GI Procedures and Studies  Previously reviewed  Laboratory Studies  Reviewed those in epic  Imaging Studies  No new relevant studies to review  Outside Records  No new outside records to review   ASSESSMENT  Ms. Mangus is a 30 y.o. female with a pmh significant for Crohn's disease (diagnosed in 2011 and reinitiated on Remicade), status post appendectomy and ileocecectomy, anal fissures, possible psoriasis, MDD/anxiety, iron deficiency anemia, status post 3 pregnancies.  The patient is  seen today for evaluation and management of:  1. Crohn's disease of both small and large intestine with other complication ([place])   2. Iron deficiency anemia, unspecified iron deficiency anemia type   3. Chronic diarrhea   4. Hemorrhoids, unspecified hemorrhoid type   5. History of immunosuppression therapy    The patient is clinically and hemodynamically stable.  She seems to have done well when she initiated cholestyramine although it caused her to develop significant discomfort in her rectum leading to hemorrhoidal disease as a result of the significant bulking of the stool.  I have asked the patient to try to decrease her cholestyramine from 1 packet daily to 1/2 packet daily to see how she does.  Hopefully this will be better for her or she may be able to use 1 packet every other day.  We will see how she does.  She may maintain FiberCon or Metamucil daily.  She will remain on her Remicade dosing at this time.  We did discuss the consideration of immunomodulators therapy but the patient is not interested in that at this time due to the increased risk of immune suppression as well as side effect profile of that.  She is not planning further children/family planning at this time but wants to be thoughtful overall.  She remains at risk for SIBO as well as EPI and we will consider these potential etiologies for her chronic diarrhea in the future though I think she has done well with the cholestyramine.  She will be due for follow-up procedures in 2023 as long as she remains well.  I will obtain repeat iron indices and anemia labs and get her set back up with hematology with been trying to reach her in regards to try to get her set up for further IV iron infusions.  She will continue oral iron at this time.  We will see her back in a few months time.  All patient questions were answered to the best of my ability, and the patient agrees to the aforementioned plan of action with follow-up as  indicated.   PLAN  Laboratories as outlined below Continue Remicade 10 mg/kg -Tylenol/Benadryl/Solu-Medrol as per protocol -Standing CBC/CMP/ESR/CRP to be drawn at each infusion Continue oral iron daily Follow-up iron indices and if iron deficiency is present will need further IV iron infusions FiberCon or Metamucil once daily Restart cholestyramine 1/2 packet daily versus 1 packet every other day  Consider SIBO evaluation in future Consider EPI evaluation in future Patient will need DEXA scan at some point in future Holding on steroid therapy at this time  in no setting of active disease being present currently Anusol suppositories to be given to patient to use nightly for 2 to 3 days if her hemorrhoids begin to cause her issues again in future   Orders Placed This Encounter  Procedures  . CBC  . IBC + Ferritin  . B12  . Folate    New Prescriptions   HYDROCORTISONE (ANUSOL-HC) 25 MG SUPPOSITORY    Place 1 suppository rectal every night for 7 days.   Modified Medications   No medications on file    Planned Follow Up Return in about 4 months (around 04/23/2021).   Total Time in Face-to-Face and in Coordination of Care for patient including independent/personal interpretation/review of prior testing, medical history, examination, medication adjustment, communicating results with the patient directly, and documentation with the EHR is 25 minutes.    Justice Britain, MD [facility] Office # [phone]

## 2020-12-26 ENCOUNTER — Telehealth: Payer: Self-pay | Admitting: Internal Medicine

## 2020-12-26 DIAGNOSIS — K649 Unspecified hemorrhoids: Secondary | ICD-10-CM | POA: Insufficient documentation

## 2020-12-26 NOTE — Telephone Encounter (Signed)
Scheduled appt per 2/16 sch msg - pt is aware of apt - scheduled on a day that works best with patients schedule.

## 2021-01-07 ENCOUNTER — Inpatient Hospital Stay (HOSPITAL_BASED_OUTPATIENT_CLINIC_OR_DEPARTMENT_OTHER): Payer: Medicaid Other | Admitting: Internal Medicine

## 2021-01-07 ENCOUNTER — Other Ambulatory Visit: Payer: Self-pay

## 2021-01-07 ENCOUNTER — Encounter: Payer: Self-pay | Admitting: Internal Medicine

## 2021-01-07 ENCOUNTER — Inpatient Hospital Stay: Payer: Medicaid Other | Attending: Physician Assistant

## 2021-01-07 VITALS — BP 109/55 | HR 68 | Temp 98.1°F | Resp 17 | Ht 61.0 in | Wt 155.6 lb

## 2021-01-07 VITALS — BP 109/69 | HR 75 | Temp 98.7°F | Resp 17

## 2021-01-07 DIAGNOSIS — D500 Iron deficiency anemia secondary to blood loss (chronic): Secondary | ICD-10-CM | POA: Insufficient documentation

## 2021-01-07 DIAGNOSIS — Z862 Personal history of diseases of the blood and blood-forming organs and certain disorders involving the immune mechanism: Secondary | ICD-10-CM

## 2021-01-07 DIAGNOSIS — D509 Iron deficiency anemia, unspecified: Secondary | ICD-10-CM

## 2021-01-07 DIAGNOSIS — N920 Excessive and frequent menstruation with regular cycle: Secondary | ICD-10-CM | POA: Insufficient documentation

## 2021-01-07 LAB — CBC WITH DIFFERENTIAL (CANCER CENTER ONLY)
Abs Immature Granulocytes: 0.01 10*3/uL (ref 0.00–0.07)
Basophils Absolute: 0.1 10*3/uL (ref 0.0–0.1)
Basophils Relative: 1 %
Eosinophils Absolute: 0.2 10*3/uL (ref 0.0–0.5)
Eosinophils Relative: 2 %
HCT: 31 % — ABNORMAL LOW (ref 36.0–46.0)
Hemoglobin: 9.2 g/dL — ABNORMAL LOW (ref 12.0–15.0)
Immature Granulocytes: 0 %
Lymphocytes Relative: 41 %
Lymphs Abs: 3.4 10*3/uL (ref 0.7–4.0)
MCH: 22.6 pg — ABNORMAL LOW (ref 26.0–34.0)
MCHC: 29.7 g/dL — ABNORMAL LOW (ref 30.0–36.0)
MCV: 76.2 fL — ABNORMAL LOW (ref 80.0–100.0)
Monocytes Absolute: 0.5 10*3/uL (ref 0.1–1.0)
Monocytes Relative: 7 %
Neutro Abs: 4.2 10*3/uL (ref 1.7–7.7)
Neutrophils Relative %: 49 %
Platelet Count: 347 10*3/uL (ref 150–400)
RBC: 4.07 MIL/uL (ref 3.87–5.11)
RDW: 18.2 % — ABNORMAL HIGH (ref 11.5–15.5)
WBC Count: 8.4 10*3/uL (ref 4.0–10.5)
nRBC: 0 % (ref 0.0–0.2)

## 2021-01-07 LAB — IRON AND TIBC
Iron: 137 ug/dL (ref 41–142)
Saturation Ratios: 28 % (ref 21–57)
TIBC: 485 ug/dL — ABNORMAL HIGH (ref 236–444)
UIBC: 348 ug/dL (ref 120–384)

## 2021-01-07 LAB — FERRITIN: Ferritin: <4 ng/mL — ABNORMAL LOW (ref 11–307)

## 2021-01-07 MED ORDER — DIPHENHYDRAMINE HCL 25 MG PO CAPS
ORAL_CAPSULE | ORAL | Status: AC
  Filled 2021-01-07: qty 1

## 2021-01-07 MED ORDER — SODIUM CHLORIDE 0.9 % IV SOLN
510.0000 mg | Freq: Once | INTRAVENOUS | Status: AC
  Administered 2021-01-07: 510 mg via INTRAVENOUS
  Filled 2021-01-07: qty 510

## 2021-01-07 MED ORDER — SODIUM CHLORIDE 0.9 % IV SOLN
Freq: Once | INTRAVENOUS | Status: AC
  Filled 2021-01-07: qty 250

## 2021-01-07 MED ORDER — ACETAMINOPHEN 325 MG PO TABS
650.0000 mg | ORAL_TABLET | Freq: Once | ORAL | Status: AC
  Administered 2021-01-07: 650 mg via ORAL

## 2021-01-07 MED ORDER — ACETAMINOPHEN 325 MG PO TABS
ORAL_TABLET | ORAL | Status: AC
  Filled 2021-01-07: qty 2

## 2021-01-07 MED ORDER — DIPHENHYDRAMINE HCL 50 MG/ML IJ SOLN
INTRAMUSCULAR | Status: AC
  Filled 2021-01-07: qty 1

## 2021-01-07 MED ORDER — DIPHENHYDRAMINE HCL 50 MG/ML IJ SOLN
25.0000 mg | Freq: Once | INTRAMUSCULAR | Status: AC
  Administered 2021-01-07: 25 mg via INTRAVENOUS

## 2021-01-07 NOTE — Progress Notes (Signed)
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 female returns to the clinic today for follow-up visit.  The patient is feeling fine today with no concerning complaints except for fatigue.  She denied having any current dizzy spells or shortness of breath.  She has her menstrual cycle at this time.  She increased her dose of the oral iron tablets to 2 tablets every day during her menstruation.  She denied having any current chest pain, cough or hemoptysis.  She denied having any nausea, vomiting, diarrhea or constipation.  She has no headache or visual changes.  She tolerated the last dose of her Feraheme infusion on November 20, 2020 fairly well.  The patient is here today for evaluation and repeat CBC, iron study and ferritin.  MEDICAL HISTORY: Past Medical History:  Diagnosis Date  . Anemia   . Anxiety   . Blood transfusion without reported diagnosis   . Central centrifugal scarring alopecia   . Chronic swimmer's ear of right side   . Crohn's colitis ([place])   . Hypertension    Phreesia 09/10/2020  . IDA (iron deficiency anemia)   . Impetigo   . Postinflammatory hyperpigmentation   . Scalp psoriasis   . Seborrheic dermatitis of scalp     ALLERGIES:  is allergic to adalimumab and aspirin.  MEDICATIONS:  Current Outpatient Medications  Medication Sig Dispense Refill  . cholestyramine (QUESTRAN) 4 g packet Take 1 packet (4 g total) by mouth 3 (three) times daily with meals. (Patient taking differently: Take 4 g by mouth 3 (three) times daily with meals as needed.) 60 each 3  . etonogestrel-ethinyl estradiol  (NUVARING) 0.12-0.015 MG/24HR vaginal ring Insert vaginally and leave in place for 3 consecutive weeks, then remove for 1 week. 1 each 12  . ferrous gluconate (FERGON) 324 MG tablet TAKE 1 TABLET BY MOUTH EVERY DAY WITH BREAKFAST 30 tablet 0  . hydrocortisone (ANUSOL-HC) 25 MG suppository Place 1 suppository rectal every night for 7 days. 12 suppository 0  . inFLIXimab (REMICADE IV) Inject into the vein. Every 8 weeks    . Multiple Vitamin (MULTIVITAMIN ADULT PO) Take by mouth daily.    . Multiple Vitamins-Minerals (HAIR SKIN AND NAILS FORMULA) TABS Take 1 tablet by mouth daily.     No current facility-administered medications for this visit.    SURGICAL HISTORY:  Past Surgical History:  Procedure Laterality Date  . APPENDECTOMY    . BOWEL RESECTION    . COLONOSCOPY    . UPPER GASTROINTESTINAL ENDOSCOPY      REVIEW OF SYSTEMS:  A comprehensive review of systems was negative except for: Constitutional: positive for fatigue   PHYSICAL EXAMINATION: General appearance: alert, cooperative, fatigued and no distress Head: Normocephalic, without obvious abnormality, atraumatic Neck: no adenopathy, no JVD, supple, symmetrical, trachea midline and thyroid not enlarged, symmetric, no tenderness/mass/nodules Lymph nodes: Cervical, supraclavicular, and axillary nodes normal. Resp: clear to auscultation bilaterally Back: symmetric, no curvature. ROM normal. No CVA tenderness. Cardio: regular rate and rhythm, S1, S2 normal, no murmur, click, rub or gallop GI: soft, non-tender; bowel sounds normal; no masses,  no organomegaly Extremities: extremities normal, atraumatic, no cyanosis or  edema  ECOG PERFORMANCE STATUS: 1 - Symptomatic but completely ambulatory  Blood pressure (!) 109/55, pulse 68, temperature 98.1 F (36.7 C), temperature source Tympanic, resp. rate 17, height 5' 1"  (1.549 m), weight 155 lb 9.6 oz (70.6 kg), SpO2 100 %.  LABORATORY DATA: Lab Results  Component Value Date   WBC  8.4 01/07/2021   HGB 9.2 (L) 01/07/2021   HCT 31.0 (L) 01/07/2021   MCV 76.2 (L) 01/07/2021   PLT 347 01/07/2021      Chemistry      Component Value Date/Time   NA 139 11/20/2020 1012   K 4.0 11/20/2020 1012   CL 104 11/20/2020 1012   CO2 22 11/20/2020 1012   BUN 13 11/20/2020 1012   CREATININE 0.89 11/20/2020 1012   CREATININE 0.84 11/14/2020 1239      Component Value Date/Time   CALCIUM 9.4 11/20/2020 1012   ALKPHOS 77 11/20/2020 1012   AST 21 11/20/2020 1012   AST 21 11/14/2020 1239   ALT 11 11/20/2020 1012   ALT 15 11/14/2020 1239   BILITOT 0.2 11/20/2020 1012   BILITOT 0.4 11/14/2020 1239       RADIOGRAPHIC STUDIES: No results found.  ASSESSMENT AND PLAN: This is a very pleasant 30 years old African-American female with history of iron deficiency anemia secondary to menorrhagia as well as gastrointestinal blood loss.  The patient received 2 doses of Feraheme infusion last month with some improvement in her iron study but she continues to have very low ferritin level.  She is currently on oral iron tablets with no improvement in her condition. I recommended for the patient to proceed with Feraheme infusion again 510 mg IV weekly for 2 doses. I will see her back for follow-up visit in around 2 months for evaluation and repeat CBC, iron study and ferritin. She was advised to call immediately if she has any other concerning symptoms in the interval. The patient voices understanding of current disease status and treatment options and is in agreement with the current care plan.  All questions were answered. The patient knows to call the clinic with any problems, questions or concerns. We can certainly see the patient much sooner if necessary.  Disclaimer: This note was dictated with voice recognition software. Similar sounding words can inadvertently be transcribed and may not be corrected upon review.

## 2021-01-07 NOTE — Progress Notes (Signed)
Pt declined to stay full 30 min observation. Pt satyed 15 min after which VS were taken and were WDL. Pt left in stable condition ambulatory to lobby.

## 2021-01-07 NOTE — Patient Instructions (Signed)
Ferumoxytol injection What is this medicine? FERUMOXYTOL is an iron complex. Iron is used to make healthy red blood cells, which carry oxygen and nutrients throughout the body. This medicine is used to treat iron deficiency anemia. This medicine may be used for other purposes; ask your health care provider or pharmacist if you have questions. COMMON BRAND NAME(S): Feraheme What should I tell my health care provider before I take this medicine? They need to know if you have any of these conditions:  anemia not caused by low iron levels  high levels of iron in the blood  magnetic resonance imaging (MRI) test scheduled  an unusual or allergic reaction to iron, other medicines, foods, dyes, or preservatives  pregnant or trying to get pregnant  breast-feeding How should I use this medicine? This medicine is for injection into a vein. It is given by a health care professional in a hospital or clinic setting. Talk to your pediatrician regarding the use of this medicine in children. Special care may be needed. Overdosage: If you think you have taken too much of this medicine contact a poison control center or emergency room at once. NOTE: This medicine is only for you. Do not share this medicine with others. What if I miss a dose? It is important not to miss your dose. Call your doctor or health care professional if you are unable to keep an appointment. What may interact with this medicine? This medicine may interact with the following medications:  other iron products This list may not describe all possible interactions. Give your health care provider a list of all the medicines, herbs, non-prescription drugs, or dietary supplements you use. Also tell them if you smoke, drink alcohol, or use illegal drugs. Some items may interact with your medicine. What should I watch for while using this medicine? Visit your doctor or healthcare professional regularly. Tell your doctor or healthcare  professional if your symptoms do not start to get better or if they get worse. You may need blood work done while you are taking this medicine. You may need to follow a special diet. Talk to your doctor. Foods that contain iron include: whole grains/cereals, dried fruits, beans, or peas, leafy green vegetables, and organ meats (liver, kidney). What side effects may I notice from receiving this medicine? Side effects that you should report to your doctor or health care professional as soon as possible:  allergic reactions like skin rash, itching or hives, swelling of the face, lips, or tongue  breathing problems  changes in blood pressure  feeling faint or lightheaded, falls  fever or chills  flushing, sweating, or hot feelings  swelling of the ankles or feet Side effects that usually do not require medical attention (report to your doctor or health care professional if they continue or are bothersome):  diarrhea  headache  nausea, vomiting  stomach pain This list may not describe all possible side effects. Call your doctor for medical advice about side effects. You may report side effects to FDA at 1-800-FDA-1088. Where should I keep my medicine? This drug is given in a hospital or clinic and will not be stored at home. NOTE: This sheet is a summary. It may not cover all possible information. If you have questions about this medicine, talk to your doctor, pharmacist, or health care provider.  2021 Elsevier/Gold Standard (2016-12-14 20:21:10)  

## 2021-01-10 ENCOUNTER — Telehealth: Payer: Self-pay | Admitting: Gastroenterology

## 2021-01-10 ENCOUNTER — Other Ambulatory Visit: Payer: Self-pay

## 2021-01-10 ENCOUNTER — Telehealth: Payer: Self-pay | Admitting: Internal Medicine

## 2021-01-10 ENCOUNTER — Non-Acute Institutional Stay (HOSPITAL_COMMUNITY)
Admission: RE | Admit: 2021-01-10 | Discharge: 2021-01-10 | Disposition: A | Discharge status: Home / Self Care | Payer: Medicaid Other | Source: Ambulatory Visit | Attending: Internal Medicine | Admitting: Internal Medicine

## 2021-01-10 DIAGNOSIS — K523 Indeterminate colitis: Secondary | ICD-10-CM | POA: Diagnosis not present

## 2021-01-10 MED ORDER — ACETAMINOPHEN 325 MG PO TABS
650.0000 mg | ORAL_TABLET | Freq: Once | ORAL | Status: AC
  Administered 2021-01-10: 650 mg via ORAL
  Filled 2021-01-10: qty 2

## 2021-01-10 MED ORDER — SODIUM CHLORIDE 0.9 % IV SOLN
10.0000 mg/kg | INTRAVENOUS | Status: DC
  Administered 2021-01-10: 700 mg via INTRAVENOUS
  Filled 2021-01-10: qty 70

## 2021-01-10 MED ORDER — DIPHENHYDRAMINE HCL 25 MG PO CAPS
25.0000 mg | ORAL_CAPSULE | Freq: Once | ORAL | Status: AC
  Administered 2021-01-10: 25 mg via ORAL
  Filled 2021-01-10: qty 1

## 2021-01-10 MED ORDER — SODIUM CHLORIDE 0.9 % IV SOLN
INTRAVENOUS | Status: DC | PRN
  Administered 2021-01-10: 250 mL via INTRAVENOUS

## 2021-01-10 NOTE — Telephone Encounter (Signed)
Caryl Pina with the [facility] from [facility] called stating that she needs a new remicade prescription faxed over at [phone] so it is ready for pt's next infusion.

## 2021-01-10 NOTE — Progress Notes (Signed)
Patient received IV Remicade as ordered by Justice Britain MD. Pre infusion medications were given and the medication titrated per protocol. Tolerated well, vitals stable, discharge instructions given, verbalized understanding. Patient alert, oriented and ambulatory at the time of discharge.

## 2021-01-10 NOTE — Telephone Encounter (Signed)
Scheduled per los. Called and spoke with patient. Confirmed appt 

## 2021-01-10 NOTE — Discharge Instructions (Signed)
Infliximab injection What is this medicine? INFLIXIMAB (in [place] i mab) is used to treat Crohn's disease and ulcerative colitis. It is also used to treat ankylosing spondylitis, plaque psoriasis, and some forms of arthritis. This medicine may be used for other purposes; ask your health care provider or pharmacist if you have questions. COMMON BRAND NAME(S): AVSOLA, INFLECTRA, IXIFI, Remicade, RENFLEXIS What should I tell my health care provider before I take this medicine? They need to know if you have any of these conditions:  cancer  current or past resident of [place] or [place]  diabetes  exposure to tuberculosis  Guillain-Barre syndrome  heart failure  hepatitis or liver disease  immune system problems  infection  lung or breathing disease, like COPD  multiple sclerosis  receiving phototherapy for the skin  seizure disorder  an unusual or allergic reaction to infliximab, mouse proteins, other medicines, foods, dyes, or preservatives  pregnant or trying to get pregnant  breast-feeding How should I use this medicine? This medicine is for injection into a vein. It is usually given by a health care professional in a hospital or clinic setting. A special MedGuide will be given to you by the pharmacist with each prescription and refill. Be sure to read this information carefully each time. Talk to your pediatrician regarding the use of this medicine in children. While this drug may be prescribed for children as young as 33 years of age for selected conditions, precautions do apply. Overdosage: If you think you have taken too much of this medicine contact a poison control center or emergency room at once. NOTE: This medicine is only for you. Do not share this medicine with others. What if I miss a dose? It is important not to miss your dose. Call your doctor or health care professional if you are unable to keep an appointment. What may interact with this  medicine? Do not take this medicine with any of the following medications:  biologic medicines such as abatacept, adalimumab, anakinra, certolizumab, etanercept, golimumab, rituximab, secukinumab, tocilizumab, tofactinib, ustekinumab  live vaccines This list may not describe all possible interactions. Give your health care provider a list of all the medicines, herbs, non-prescription drugs, or dietary supplements you use. Also tell them if you smoke, drink alcohol, or use illegal drugs. Some items may interact with your medicine. What should I watch for while using this medicine? Your condition will be monitored carefully while you are receiving this medicine. Visit your doctor or health care professional for regular checks on your progress. You may need blood work done while you are taking this medicine. Before beginning therapy, your doctor may do a test to see if you have been exposed to tuberculosis. Call your doctor or health care professional for advice if you get a fever, chills or sore throat, or other symptoms of a cold or flu. Do not treat yourself. This drug decreases your body's ability to fight infections. Try to avoid being around people who are sick. This medicine may make the symptoms of heart failure worse in some patients. If you notice symptoms such as increased shortness of breath or swelling of the ankles or legs, contact your health care provider right away. If you are going to have surgery or dental work, tell your health care professional or dentist that you have received this medicine. If you take this medicine for plaque psoriasis, stay out of the sun. If you cannot avoid being in the sun, wear protective clothing and use sunscreen.  Do not use sun lamps or tanning beds/booths. Talk to your doctor about your risk of cancer. You may be more at risk for certain types of cancers if you take this medicine. What side effects may I notice from receiving this medicine? Side effects  that you should report to your doctor or health care professional as soon as possible:  allergic reactions like skin rash, itching or hives, swelling of the face, lips, or tongue  breathing problems  changes in vision  chest pain  fever or chills, usually related to the infusion  joint pain  pain, tingling, numbness in the hands or feet  redness, blistering, peeling or loosening of the skin, including inside the mouth  seizures  signs of infection - fever or chills, cough, sore throat, flu-like symptoms, pain or difficulty passing urine  signs and symptoms of liver injury like dark yellow or brown urine; general ill feeling; light-colored stools; loss of appetite; nausea; right upper belly pain; unusually weak or tired; yellowing of the eyes or skin  signs and symptoms of a stroke like changes in vision; confusion; trouble speaking or understanding; severe headaches; sudden numbness or weakness of the face, arm or leg; trouble walking; dizziness; loss of balance or coordination  swelling of the ankles, feet, or hands  swollen lymph nodes in the neck, underarm, or groin areas  unusual bleeding or bruising  unusually weak or tired Side effects that usually do not require medical attention (report to your doctor or health care professional if they continue or are bothersome):  headache  nausea  stomach pain  upset stomach This list may not describe all possible side effects. Call your doctor for medical advice about side effects. You may report side effects to FDA at 1-800-FDA-1088. Where should I keep my medicine? This drug is given in a hospital or clinic and will not be stored at home. NOTE: This sheet is a summary. It may not cover all possible information. If you have questions about this medicine, talk to your doctor, pharmacist, or health care provider.  2021 Elsevier/Gold Standard (2016-11-25 13:45:32)

## 2021-01-13 ENCOUNTER — Other Ambulatory Visit: Payer: Self-pay

## 2021-01-13 DIAGNOSIS — K50818 Crohn's disease of both small and large intestine with other complication: Secondary | ICD-10-CM

## 2021-01-13 NOTE — Telephone Encounter (Signed)
Order for remicade has been entered as 10 mg/kg every 8 weeks. Marland Kitchen

## 2021-01-20 ENCOUNTER — Inpatient Hospital Stay: Payer: Medicaid Other

## 2021-01-20 ENCOUNTER — Other Ambulatory Visit: Payer: Self-pay

## 2021-01-20 ENCOUNTER — Telehealth: Payer: Self-pay | Admitting: Internal Medicine

## 2021-01-20 VITALS — BP 117/74 | HR 85 | Temp 99.1°F | Resp 18 | Wt 155.8 lb

## 2021-01-20 DIAGNOSIS — D509 Iron deficiency anemia, unspecified: Secondary | ICD-10-CM

## 2021-01-20 DIAGNOSIS — D500 Iron deficiency anemia secondary to blood loss (chronic): Secondary | ICD-10-CM | POA: Diagnosis not present

## 2021-01-20 MED ORDER — DIPHENHYDRAMINE HCL 25 MG PO CAPS
ORAL_CAPSULE | ORAL | Status: AC
  Filled 2021-01-20: qty 1

## 2021-01-20 MED ORDER — DIPHENHYDRAMINE HCL 50 MG/ML IJ SOLN: 25.0000 mg | Freq: Once | INTRAMUSCULAR | Status: DC

## 2021-01-20 MED ORDER — SODIUM CHLORIDE 0.9 % IV SOLN
510.0000 mg | Freq: Once | INTRAVENOUS | Status: AC
  Administered 2021-01-20: 510 mg via INTRAVENOUS
  Filled 2021-01-20: qty 510

## 2021-01-20 MED ORDER — ACETAMINOPHEN 325 MG PO TABS
650.0000 mg | ORAL_TABLET | Freq: Once | ORAL | Status: AC
  Administered 2021-01-20: 650 mg via ORAL

## 2021-01-20 MED ORDER — ACETAMINOPHEN 325 MG PO TABS
ORAL_TABLET | ORAL | Status: AC
  Filled 2021-01-20: qty 2

## 2021-01-20 MED ORDER — SODIUM CHLORIDE 0.9 % IV SOLN
Freq: Once | INTRAVENOUS | Status: AC
  Filled 2021-01-20: qty 250

## 2021-01-20 MED ORDER — DIPHENHYDRAMINE HCL 25 MG PO CAPS
25.0000 mg | ORAL_CAPSULE | Freq: Once | ORAL | Status: AC
  Administered 2021-01-20: 25 mg via ORAL

## 2021-01-20 NOTE — Telephone Encounter (Signed)
Called patient regarding 03/14 scheduled message, patient does not want to cancel appointment and will be coming to receive her Iron transfusion today.

## 2021-01-20 NOTE — Patient Instructions (Signed)

## 2021-01-28 ENCOUNTER — Inpatient Hospital Stay: Payer: Medicaid Other

## 2021-01-28 ENCOUNTER — Telehealth: Payer: Self-pay | Admitting: Internal Medicine

## 2021-01-28 NOTE — Telephone Encounter (Signed)
Patient came in for appt that was scheduled today but she was unaware that it had been cancelled. Patient became upset to find out she no longer had an appt. Patient needed note for work, which Ansyi informed her that since we technically did not see her we could not give her an excuse note for work but probably type up something explaining the confusion. After getting approval from [employer], I typed a letter for the patient explaining the miscommunication of her cancelled appt that was originally scheduled for today. Patient expressed gratitude and I confirmed her next appt coming up in May.

## 2021-02-03 ENCOUNTER — Other Ambulatory Visit: Payer: Self-pay

## 2021-02-03 ENCOUNTER — Encounter: Payer: Self-pay | Admitting: Emergency Medicine

## 2021-02-03 ENCOUNTER — Ambulatory Visit
Admission: EM | Admit: 2021-02-03 | Discharge: 2021-02-03 | Disposition: A | Discharge status: Home / Self Care | Payer: Medicaid Other | Attending: Emergency Medicine | Admitting: Emergency Medicine

## 2021-02-03 DIAGNOSIS — K0889 Other specified disorders of teeth and supporting structures: Secondary | ICD-10-CM | POA: Diagnosis not present

## 2021-02-03 MED ORDER — AMOXICILLIN-POT CLAVULANATE 875-125 MG PO TABS: 1.0000 | ORAL_TABLET | Freq: Two times a day (BID) | ORAL | 0 refills | Status: DC

## 2021-02-03 MED ORDER — NAPROXEN 500 MG PO TABS: 500.0000 mg | ORAL_TABLET | Freq: Two times a day (BID) | ORAL | 0 refills | Status: DC

## 2021-02-03 NOTE — ED Triage Notes (Signed)
Patient is going Thursday for definitive care of this tooth issue.  Pain is bottom right tooth.  Patient says appt cannot be moved up, requesting pain management

## 2021-02-03 NOTE — Discharge Instructions (Addendum)
Please start antibiotics provided. Naproxen twice a day for pain, take with food.  Follow up with your oral surgeon as scheduled.

## 2021-02-03 NOTE — ED Provider Notes (Signed)
[name]    [name]: [id number] Arrival date & time: 02/03/21  1543      History   Chief Complaint Chief Complaint  Patient presents with  . Dental Pain    HPI Erika Stephens is a 30 y.o. female.   Erika Stephens presents with complaints of acute on chronic right lower molar pain. She has had multiple root canal's on the affected tooth. Increased pain over the past few days. Has an appointment with oral surgeon in 4 days. No fevers. No drainage. No facial swelling.    ROS per HPI, negative if not otherwise mentioned.      Past Medical History:  Diagnosis Date  . Anemia   . Anxiety   . Blood transfusion without reported diagnosis   . Central centrifugal scarring alopecia   . Chronic swimmer's ear of right side   . Crohn's colitis ([place])   . Hypertension    Phreesia 09/10/2020  . IDA (iron deficiency anemia)   . Impetigo   . Postinflammatory hyperpigmentation   . Scalp psoriasis   . Seborrheic dermatitis of scalp     Patient Active Problem List   Diagnosis Date Noted  . Hemorrhoids 12/26/2020  . Chest pain of uncertain etiology 76/72/0947  . Essential hypertension 12/25/2020  . History of pre-eclampsia 12/25/2020  . ASCUS of cervix with negative high risk HPV 11/27/2020  . Iron deficiency anemia 11/14/2020  . Fecal urgency 10/16/2020  . Nasal sore 10/16/2020  . Crohn's disease of both small and large intestine with other complication ([place]) 09/62/8366  . History of iron deficiency anemia 03/08/2020  . Infliximab (Remicade) long-term use 03/08/2020  . History of immunosuppression therapy 03/08/2020  . Generalized abdominal pain 03/08/2020  . Bloating symptom 03/08/2020  . Chronic diarrhea 03/08/2020    Past Surgical History:  Procedure Laterality Date  . APPENDECTOMY    . BOWEL RESECTION    . COLONOSCOPY    . UPPER GASTROINTESTINAL ENDOSCOPY      OB History   No obstetric history on file.      Home Medications    Prior to Admission  medications   Medication Sig Start Date End Date Taking? Authorizing Provider  amoxicillin-clavulanate (AUGMENTIN) 875-125 MG tablet Take 1 tablet by mouth every 12 (twelve) hours. 02/03/21  Yes [name], NP  etonogestrel-ethinyl estradiol (NUVARING) 0.12-0.015 MG/24HR vaginal ring Insert vaginally and leave in place for 3 consecutive weeks, then remove for 1 week. 12/05/20  Yes [name], DO  inFLIXimab (REMICADE IV) Inject into the vein. Every 8 weeks   Yes [provider]  Multiple Vitamin (MULTIVITAMIN ADULT PO) Take by mouth daily.   Yes [provider]  Multiple Vitamins-Minerals (HAIR SKIN AND NAILS FORMULA) TABS Take 1 tablet by mouth daily.   Yes [provider]  naproxen (NAPROSYN) 500 MG tablet Take 1 tablet (500 mg total) by mouth 2 (two) times daily. 02/03/21  Yes [name], NP  cholestyramine (QUESTRAN) 4 g packet Take 1 packet (4 g total) by mouth 3 (three) times daily with meals. Patient taking differently: Take 4 g by mouth 3 (three) times daily with meals as needed. 10/15/20   Mansouraty, Telford Nab., MD  ferrous gluconate (FERGON) 324 MG tablet TAKE 1 TABLET BY MOUTH EVERY DAY WITH BREAKFAST 12/23/20   Mansouraty, Telford Nab., MD  hydrocortisone (ANUSOL-HC) 25 MG suppository Place 1 suppository rectal every night for 7 days. 12/24/20   Mansouraty, Telford Nab., MD    Family History  Family History  Problem Relation Age of Onset  . Crohn's disease Mother   . Schizophrenia Father   . Bipolar disorder Father   . Crohn's disease Maternal Grandmother   . Prostate cancer Maternal Uncle   . Colon cancer Neg Hx   . Stomach cancer Neg Hx   . Pancreatic cancer Neg Hx   . Esophageal cancer Neg Hx   . Inflammatory bowel disease Neg Hx   . Liver disease Neg Hx   . Rectal cancer Neg Hx     Social History Social History   Tobacco Use  . Smoking status: Never Smoker  . Smokeless tobacco: Never Used  Vaping Use  . Vaping Use:  Never used  Substance Use Topics  . Alcohol use: Yes    Comment: socially.   . Drug use: Yes    Types: Marijuana    Comment: last used 04-17-20 per pt     Allergies   Adalimumab and Aspirin   Review of Systems Review of Systems   Physical Exam Triage Vital Signs ED Triage Vitals  Enc Vitals Group     BP 02/03/21 1717 (!) 105/52     Pulse Rate 02/03/21 1717 65     Resp 02/03/21 1717 18     Temp 02/03/21 1717 98.9 F (37.2 C)     Temp Source 02/03/21 1717 Oral     SpO2 02/03/21 1717 98 %     Weight --      Height --      Head Circumference --      Peak Flow --      Pain Score 02/03/21 1726 8     Pain Loc --      Pain Edu? --      Excl. in [place]? --    No data found.  Updated Vital Signs BP (!) 105/52 (BP Location: Left Arm)   Pulse 65   Temp 98.9 F (37.2 C) (Oral)   Resp 18   LMP 02/03/2021   SpO2 98%   Visual Acuity Right Eye Distance:   Left Eye Distance:   Bilateral Distance:    Right Eye Near:   Left Eye Near:    Bilateral Near:     Physical Exam Constitutional:      General: She is not in acute distress.    Appearance: She is well-developed.  HENT:     Mouth/Throat:     Dentition: Dental tenderness present.   Cardiovascular:     Rate and Rhythm: Normal rate.  Pulmonary:     Effort: Pulmonary effort is normal.  Skin:    General: Skin is warm and dry.  Neurological:     Mental Status: She is alert and oriented to person, place, and time.      UC Treatments / Results  Labs (all labs ordered are listed, but only abnormal results are displayed) Labs Reviewed - No data to display  EKG   Radiology No results found.  Procedures Procedures (including critical care time)  Medications Ordered in UC Medications - No data to display  Initial Impression / Assessment and Plan / UC Course  I have reviewed the triage vital signs and the nursing notes.  Pertinent labs & imaging results that were available during my care of the patient  were reviewed by me and considered in my medical decision making (see chart for details).    Dental pain to tooth with historic issues. Has appointment in a few days with oral surgeon. antibiotics  and pain medications provided. Patient verbalized understanding and agreeable to plan.    Final Clinical Impressions(s) / UC Diagnoses   Final diagnoses:  Pain, dental     Discharge Instructions     Please start antibiotics provided. Naproxen twice a day for pain, take with food.  Follow up with your oral surgeon as scheduled.    ED Prescriptions    Medication Sig Dispense Auth. Provider   amoxicillin-clavulanate (AUGMENTIN) 875-125 MG tablet Take 1 tablet by mouth every 12 (twelve) hours. 14 tablet Augusto Gamble B, NP   naproxen (NAPROSYN) 500 MG tablet Take 1 tablet (500 mg total) by mouth 2 (two) times daily. 30 tablet [name], NP     PDMP not reviewed this encounter.   [name], NP 02/05/21 [phone]

## 2021-02-12 DIAGNOSIS — D492 Neoplasm of unspecified behavior of bone, soft tissue, and skin: Secondary | ICD-10-CM | POA: Diagnosis not present

## 2021-02-12 DIAGNOSIS — D22122 Melanocytic nevi of left lower eyelid, including canthus: Secondary | ICD-10-CM | POA: Diagnosis not present

## 2021-02-12 DIAGNOSIS — L700 Acne vulgaris: Secondary | ICD-10-CM | POA: Diagnosis not present

## 2021-02-12 DIAGNOSIS — B081 Molluscum contagiosum: Secondary | ICD-10-CM | POA: Diagnosis not present

## 2021-02-12 DIAGNOSIS — D22121 Melanocytic nevi of left upper eyelid, including canthus: Secondary | ICD-10-CM | POA: Diagnosis not present

## 2021-02-26 ENCOUNTER — Telehealth: Payer: Self-pay | Admitting: Internal Medicine

## 2021-02-26 NOTE — Telephone Encounter (Signed)
Had called patient several time to schedule GXT.   Spoke with her today and she don't want to do the test.  She stated she if feeling better.

## 2021-03-10 ENCOUNTER — Inpatient Hospital Stay: Payer: Medicaid Other

## 2021-03-10 ENCOUNTER — Inpatient Hospital Stay: Payer: Medicaid Other | Attending: Physician Assistant | Admitting: Internal Medicine

## 2021-03-11 ENCOUNTER — Other Ambulatory Visit: Payer: Self-pay

## 2021-03-11 ENCOUNTER — Ambulatory Visit
Admission: EM | Admit: 2021-03-11 | Discharge: 2021-03-11 | Disposition: A | Discharge status: Home / Self Care | Payer: Medicaid Other | Attending: Emergency Medicine | Admitting: Emergency Medicine

## 2021-03-11 DIAGNOSIS — Z20822 Contact with and (suspected) exposure to covid-19: Secondary | ICD-10-CM

## 2021-03-11 DIAGNOSIS — J069 Acute upper respiratory infection, unspecified: Secondary | ICD-10-CM

## 2021-03-11 DIAGNOSIS — K50818 Crohn's disease of both small and large intestine with other complication: Secondary | ICD-10-CM

## 2021-03-11 MED ORDER — IBUPROFEN 600 MG PO TABS: 600.0000 mg | ORAL_TABLET | Freq: Three times a day (TID) | ORAL | 0 refills | Status: DC

## 2021-03-11 MED ORDER — CETIRIZINE HCL 10 MG PO CAPS: 10.0000 mg | ORAL_CAPSULE | Freq: Every day | ORAL | 0 refills | Status: DC

## 2021-03-11 MED ORDER — BENZONATATE 200 MG PO CAPS: 200.0000 mg | ORAL_CAPSULE | Freq: Three times a day (TID) | ORAL | 0 refills | Status: AC | PRN

## 2021-03-11 MED ORDER — FLUTICASONE PROPIONATE 50 MCG/ACT NA SUSP: 1.0000 | Freq: Every day | NASAL | 0 refills | Status: DC

## 2021-03-11 NOTE — ED Provider Notes (Signed)
[name]    [name]: [id number] Arrival date & time: 03/11/21  0923      History   Chief Complaint Chief Complaint  Patient presents with  . Cough    HPI Erika Stephens is a 30 y.o. female presenting today for evaluation of URI symptoms.  Reports over the past 2 to 3 days has developed cough congestion sore throat along with body aches generalized fatigue and headaches.'s children here with similar symptoms.  Took at home COVID test which was negative.  Flu exposure at [employer].  Reports 1 episode of vomiting, but no persistent vomiting.  Decreased appetite.  Denies dysuria, increased frequency or urgency.   HPI  Past Medical History:  Diagnosis Date  . Anemia   . Anxiety   . Blood transfusion without reported diagnosis   . Central centrifugal scarring alopecia   . Chronic swimmer's ear of right side   . Crohn's colitis ([place])   . Hypertension    Phreesia 09/10/2020  . IDA (iron deficiency anemia)   . Impetigo   . Postinflammatory hyperpigmentation   . Scalp psoriasis   . Seborrheic dermatitis of scalp     Patient Active Problem List   Diagnosis Date Noted  . Hemorrhoids 12/26/2020  . Chest pain of uncertain etiology 74/06/1447  . Essential hypertension 12/25/2020  . History of pre-eclampsia 12/25/2020  . ASCUS of cervix with negative high risk HPV 11/27/2020  . Iron deficiency anemia 11/14/2020  . Fecal urgency 10/16/2020  . Nasal sore 10/16/2020  . Crohn's disease of both small and large intestine with other complication ([place]) 18/56/3149  . History of iron deficiency anemia 03/08/2020  . Infliximab (Remicade) long-term use 03/08/2020  . History of immunosuppression therapy 03/08/2020  . Generalized abdominal pain 03/08/2020  . Bloating symptom 03/08/2020  . Chronic diarrhea 03/08/2020    Past Surgical History:  Procedure Laterality Date  . APPENDECTOMY    . BOWEL RESECTION    . COLONOSCOPY    . UPPER GASTROINTESTINAL ENDOSCOPY      OB  History   No obstetric history on file.      Home Medications    Prior to Admission medications   Medication Sig Start Date End Date Taking? Authorizing Provider  benzonatate (TESSALON) 200 MG capsule Take 1 capsule (200 mg total) by mouth 3 (three) times daily as needed for up to 7 days for cough. 03/11/21 03/18/21 Yes [name] C, PA-C  Cetirizine HCl 10 MG CAPS Take 1 capsule (10 mg total) by mouth daily for 10 days. 03/11/21 03/21/21 Yes [name] C, PA-C  fluticasone (FLONASE) 50 MCG/ACT nasal spray Place 1-2 sprays into both nostrils daily. 03/11/21  Yes [name] C, PA-C  ibuprofen (ADVIL) 600 MG tablet Take 1 tablet (600 mg total) by mouth 3 (three) times daily. With food 03/11/21  Yes [name], [place] C, PA-C  etonogestrel-ethinyl estradiol (NUVARING) 0.12-0.015 MG/24HR vaginal ring Insert vaginally and leave in place for 3 consecutive weeks, then remove for 1 week. 12/05/20   [name], DO  inFLIXimab (REMICADE IV) Inject into the vein. Every 8 weeks    [provider]  Multiple Vitamin (MULTIVITAMIN ADULT PO) Take by mouth daily.    [provider]  Multiple Vitamins-Minerals (HAIR SKIN AND NAILS FORMULA) TABS Take 1 tablet by mouth daily.    [provider]  naproxen (NAPROSYN) 500 MG tablet Take 1 tablet (500 mg total) by mouth 2 (two) times daily. 02/03/21   [name],  NP    Family History Family History  Problem Relation Age of Onset  . Crohn's disease Mother   . Schizophrenia Father   . Bipolar disorder Father   . Crohn's disease Maternal Grandmother   . Prostate cancer Maternal Uncle   . Colon cancer Neg Hx   . Stomach cancer Neg Hx   . Pancreatic cancer Neg Hx   . Esophageal cancer Neg Hx   . Inflammatory bowel disease Neg Hx   . Liver disease Neg Hx   . Rectal cancer Neg Hx     Social History Social History   Tobacco Use  . Smoking status: Never Smoker  . Smokeless tobacco: Never Used  Vaping Use  .  Vaping Use: Never used  Substance Use Topics  . Alcohol use: Yes    Comment: socially.   . Drug use: Not Currently    Types: Marijuana    Comment: last used 04-17-20 per pt     Allergies   Adalimumab and Aspirin   Review of Systems Review of Systems  Constitutional: Positive for appetite change, chills and fatigue. Negative for activity change and fever.  HENT: Positive for congestion, rhinorrhea, sinus pressure and sore throat. Negative for ear pain and trouble swallowing.   Eyes: Negative for discharge and redness.  Respiratory: Positive for cough. Negative for chest tightness and shortness of breath.   Cardiovascular: Negative for chest pain.  Gastrointestinal: Negative for abdominal pain, diarrhea, nausea and vomiting.  Musculoskeletal: Positive for back pain and myalgias.  Skin: Negative for rash.  Neurological: Positive for headaches. Negative for dizziness and light-headedness.     Physical Exam Triage Vital Signs ED Triage Vitals  Enc Vitals Group     BP 03/11/21 1112 120/86     Pulse Rate 03/11/21 1112 72     Resp 03/11/21 1112 18     Temp 03/11/21 1112 98.2 F (36.8 C)     Temp Source 03/11/21 1112 Oral     SpO2 03/11/21 1112 97 %     Weight --      Height --      Head Circumference --      Peak Flow --      Pain Score 03/11/21 1114 8     Pain Loc --      Pain Edu? --      Excl. in [place]? --    No data found.  Updated Vital Signs BP 120/86 (BP Location: Left Arm)   Pulse 72   Temp 98.2 F (36.8 C) (Oral)   Resp 18   LMP 03/04/2021   SpO2 97%   Visual Acuity Right Eye Distance:   Left Eye Distance:   Bilateral Distance:    Right Eye Near:   Left Eye Near:    Bilateral Near:     Physical Exam Vitals and nursing note reviewed.  Constitutional:      Appearance: She is well-developed.     Comments: No acute distress  HENT:     Head: Normocephalic and atraumatic.     Ears:     Comments: Bilateral ears without tenderness to palpation of  external auricle, tragus and mastoid, EAC's without erythema or swelling, TM's with good bony landmarks and cone of light. Non erythematous.     Nose: Nose normal.     Mouth/Throat:     Comments: Oral mucosa pink and moist, no tonsillar enlargement or exudate. Posterior pharynx patent and nonerythematous, no uvula deviation or swelling. Normal phonation. Eyes:  Conjunctiva/sclera: Conjunctivae normal.  Cardiovascular:     Rate and Rhythm: Normal rate.  Pulmonary:     Effort: Pulmonary effort is normal. No respiratory distress.     Comments: Breathing comfortably at rest, CTABL, no wheezing, rales or other adventitious sounds auscultated Abdominal:     General: There is no distension.  Musculoskeletal:        General: Normal range of motion.     Cervical back: Neck supple.  Skin:    General: Skin is warm and dry.  Neurological:     Mental Status: She is alert and oriented to person, place, and time.      UC Treatments / Results  Labs (all labs ordered are listed, but only abnormal results are displayed) Labs Reviewed  COVID-19, FLU A+B NAA    EKG   Radiology No results found.  Procedures Procedures (including critical care time)  Medications Ordered in UC Medications - No data to display  Initial Impression / Assessment and Plan / UC Course  I have reviewed the triage vital signs and the nursing notes.  Pertinent labs & imaging results that were available during my care of the patient were reviewed by me and considered in my medical decision making (see chart for details).     Viral URI with cough-symptoms x2 to 3 days, exam reassuring, COVID and flu test pending for screening.  Continue symptomatic and supportive care.  Recommendations provided.  Discussed strict return precautions. Patient verbalized understanding and is agreeable with plan.  Final Clinical Impressions(s) / UC Diagnoses   Final diagnoses:  Encounter for screening laboratory testing for  COVID-19 virus  Viral URI with cough     Discharge Instructions     COVID/flu test pending Tylenol and ibuprofen as needed for headache, body aches, fevers Daily cetirizine and Flonase for congestion and drainage May use Tessalon for cough or may use over-the-counter Robitussin, Delsym, Dimetapp Rest and fluids Follow-up if not improving or worsening    ED Prescriptions    Medication Sig Dispense Auth. Provider   fluticasone (FLONASE) 50 MCG/ACT nasal spray Place 1-2 sprays into both nostrils daily. 16 g Caral Whan C, PA-C   benzonatate (TESSALON) 200 MG capsule Take 1 capsule (200 mg total) by mouth 3 (three) times daily as needed for up to 7 days for cough. 28 capsule Daniell Paradise C, PA-C   ibuprofen (ADVIL) 600 MG tablet Take 1 tablet (600 mg total) by mouth 3 (three) times daily. With food 20 tablet Cybill Uriegas C, PA-C   Cetirizine HCl 10 MG CAPS Take 1 capsule (10 mg total) by mouth daily for 10 days. 10 capsule Kyshon Tolliver, Raymond C, PA-C     PDMP not reviewed this encounter.   Janith Lima, [place] 03/11/21 1143

## 2021-03-11 NOTE — ED Triage Notes (Signed)
Pt c/o cough, body aches, kidney pain, sore throat , congestion, fatigue, and slight headache since Sunday. States had a neg home covid test yesterday.

## 2021-03-11 NOTE — Discharge Instructions (Signed)
COVID/flu test pending Tylenol and ibuprofen as needed for headache, body aches, fevers Daily cetirizine and Flonase for congestion and drainage May use Tessalon for cough or may use over-the-counter Robitussin, Delsym, Dimetapp Rest and fluids Follow-up if not improving or worsening

## 2021-03-12 ENCOUNTER — Other Ambulatory Visit: Payer: Self-pay

## 2021-03-12 ENCOUNTER — Non-Acute Institutional Stay (HOSPITAL_COMMUNITY)
Admission: RE | Admit: 2021-03-12 | Discharge: 2021-03-12 | Disposition: A | Discharge status: Home / Self Care | Payer: Medicaid Other | Source: Ambulatory Visit | Attending: Internal Medicine | Admitting: Internal Medicine

## 2021-03-12 DIAGNOSIS — K50818 Crohn's disease of both small and large intestine with other complication: Secondary | ICD-10-CM | POA: Diagnosis not present

## 2021-03-12 LAB — COVID-19, FLU A+B NAA
Influenza A, NAA: DETECTED — AB
Influenza B, NAA: NOT DETECTED
SARS-CoV-2, NAA: NOT DETECTED

## 2021-03-12 MED ORDER — ACETAMINOPHEN 325 MG PO TABS
650.0000 mg | ORAL_TABLET | ORAL | Status: DC
  Administered 2021-03-12: 650 mg via ORAL
  Filled 2021-03-12: qty 2

## 2021-03-12 MED ORDER — SODIUM CHLORIDE 0.9 % IV SOLN
10.0000 mg/kg | INTRAVENOUS | Status: DC
  Administered 2021-03-12: 700 mg via INTRAVENOUS
  Filled 2021-03-12: qty 70

## 2021-03-12 MED ORDER — DIPHENHYDRAMINE HCL 25 MG PO CAPS
25.0000 mg | ORAL_CAPSULE | ORAL | Status: DC
  Administered 2021-03-12: 25 mg via ORAL
  Filled 2021-03-12: qty 1

## 2021-03-12 MED ORDER — SODIUM CHLORIDE 0.9 % IV SOLN
INTRAVENOUS | Status: DC | PRN
  Administered 2021-03-12: 250 mL via INTRAVENOUS

## 2021-03-12 NOTE — Progress Notes (Signed)
PATIENT CARE CENTER NOTE   Diagnosis: Crohn's disease     Provider: Justice Britain MD     Procedure: Remicade infusion     Note: Patient received Remicade infusion via PIV. Pre medicated with PO Tylenol and Benadryl. Infusion titrated per protocol. Patient tolerated well with no adverse reaction. Vital signs stable. Pt declined AVS.  Pt declined to stay 1 hour post infusion for observation. Patient to come back every 8 weeks for infusion, instructed to make next appointment with scheduler prior to leaving, verbalized understanding. Alert, oriented and ambulatory at discharge.

## 2021-04-01 ENCOUNTER — Ambulatory Visit
Admission: EM | Admit: 2021-04-01 | Discharge: 2021-04-01 | Disposition: A | Discharge status: Home / Self Care | Payer: Medicaid Other | Attending: Emergency Medicine | Admitting: Emergency Medicine

## 2021-04-01 ENCOUNTER — Other Ambulatory Visit: Payer: Self-pay

## 2021-04-01 ENCOUNTER — Telehealth: Payer: Self-pay | Admitting: Internal Medicine

## 2021-04-01 DIAGNOSIS — R079 Chest pain, unspecified: Secondary | ICD-10-CM | POA: Diagnosis not present

## 2021-04-01 DIAGNOSIS — R0789 Other chest pain: Secondary | ICD-10-CM

## 2021-04-01 MED ORDER — CELECOXIB 100 MG PO CAPS: 100.0000 mg | ORAL_CAPSULE | Freq: Two times a day (BID) | ORAL | 0 refills | Status: DC

## 2021-04-01 MED ORDER — KETOROLAC TROMETHAMINE 30 MG/ML IJ SOLN
15.0000 mg | Freq: Once | INTRAMUSCULAR | Status: AC
  Administered 2021-04-01: 15 mg via INTRAMUSCULAR

## 2021-04-01 NOTE — Telephone Encounter (Signed)
Left a message to call back.

## 2021-04-01 NOTE — ED Provider Notes (Signed)
[name]    [name]: [id number] Arrival date & time: 04/01/21  1011      History   Chief Complaint Chief Complaint  Patient presents with  . Chest Pain  . Shortness of Breath    HPI Erika Stephens is a 30 y.o. female history of Crohn's, hypertension, presenting today for evaluation of chest discomfort.  Reports chest discomfort intermittently for 1 week with associated shortness of breath.  Symptoms began last night and have been constant since.  Notices symptoms typically start while exerting self/movement.  Reports pain has subsided since patient has been here.  Initially had some nausea, but denies this currently.  Previously seen by cardiology recommended if stress test, but symptoms improved with getting iron infusions so this was never performed.  She reports history of preeclampsia/hypertension in setting of pregnancy, but denies hypertension otherwise.  Endorses marijuana use, but denies any tobacco use.  Denies other recreational drugs.  Denies history of diabetes.  Denies history of prior DVT/PE.  Has had some slight discomfort in her inner right thigh but attributes this to a varicose vein.  HPI  Past Medical History:  Diagnosis Date  . Anemia   . Anxiety   . Blood transfusion without reported diagnosis   . Central centrifugal scarring alopecia   . Chronic swimmer's ear of right side   . Crohn's colitis ([place])   . Hypertension    Phreesia 09/10/2020  . IDA (iron deficiency anemia)   . Impetigo   . Postinflammatory hyperpigmentation   . Scalp psoriasis   . Seborrheic dermatitis of scalp     Patient Active Problem List   Diagnosis Date Noted  . Hemorrhoids 12/26/2020  . Chest pain of uncertain etiology 91/63/8466  . Essential hypertension 12/25/2020  . History of pre-eclampsia 12/25/2020  . ASCUS of cervix with negative high risk HPV 11/27/2020  . Iron deficiency anemia 11/14/2020  . Fecal urgency 10/16/2020  . Nasal sore 10/16/2020  . Crohn's  disease of both small and large intestine with other complication ([place]) 59/93/5701  . History of iron deficiency anemia 03/08/2020  . Infliximab (Remicade) long-term use 03/08/2020  . History of immunosuppression therapy 03/08/2020  . Generalized abdominal pain 03/08/2020  . Bloating symptom 03/08/2020  . Chronic diarrhea 03/08/2020    Past Surgical History:  Procedure Laterality Date  . APPENDECTOMY    . BOWEL RESECTION    . COLONOSCOPY    . UPPER GASTROINTESTINAL ENDOSCOPY      OB History   No obstetric history on file.      Home Medications    Prior to Admission medications   Medication Sig Start Date End Date Taking? Authorizing Provider  celecoxib (CELEBREX) 100 MG capsule Take 1 capsule (100 mg total) by mouth 2 (two) times daily. 04/01/21  Yes Gurtaj Ruz C, PA-C  Cetirizine HCl 10 MG CAPS Take 1 capsule (10 mg total) by mouth daily for 10 days. 03/11/21 03/21/21  Turner Baillie, Elesa Hacker, PA-C  etonogestrel-ethinyl estradiol (NUVARING) 0.12-0.015 MG/24HR vaginal ring Insert vaginally and leave in place for 3 consecutive weeks, then remove for 1 week. 12/05/20   [name], DO  fluticasone (FLONASE) 50 MCG/ACT nasal spray Place 1-2 sprays into both nostrils daily. 03/11/21   Sharrell Krawiec C, PA-C  inFLIXimab (REMICADE IV) Inject into the vein. Every 8 weeks    [provider]  Multiple Vitamin (MULTIVITAMIN ADULT PO) Take by mouth daily.    [provider]  Multiple Vitamins-Minerals (HAIR SKIN AND NAILS  FORMULA) TABS Take 1 tablet by mouth daily.    [provider]    Family History Family History  Problem Relation Age of Onset  . Crohn's disease Mother   . Schizophrenia Father   . Bipolar disorder Father   . Crohn's disease Maternal Grandmother   . Prostate cancer Maternal Uncle   . Colon cancer Neg Hx   . Stomach cancer Neg Hx   . Pancreatic cancer Neg Hx   . Esophageal cancer Neg Hx   . Inflammatory bowel disease Neg Hx   .  Liver disease Neg Hx   . Rectal cancer Neg Hx     Social History Social History   Tobacco Use  . Smoking status: Never Smoker  . Smokeless tobacco: Never Used  Vaping Use  . Vaping Use: Never used  Substance Use Topics  . Alcohol use: Yes    Comment: socially.   . Drug use: Not Currently    Types: Marijuana    Comment: last used 04-17-20 per pt     Allergies   Adalimumab and Aspirin   Review of Systems Review of Systems  Constitutional: Negative for fatigue and fever.  HENT: Negative for congestion, sinus pressure and sore throat.   Eyes: Negative for photophobia, pain and visual disturbance.  Respiratory: Negative for cough and shortness of breath.   Cardiovascular: Positive for chest pain.  Gastrointestinal: Negative for abdominal pain, nausea and vomiting.  Genitourinary: Negative for decreased urine volume and hematuria.  Musculoskeletal: Negative for myalgias, neck pain and neck stiffness.  Neurological: Negative for dizziness, syncope, facial asymmetry, speech difficulty, weakness, light-headedness, numbness and headaches.     Physical Exam Triage Vital Signs ED Triage Vitals [04/01/21 1024]  Enc Vitals Group     BP 124/78     Pulse Rate 96     Resp 18     Temp 98.7 F (37.1 C)     Temp Source Oral     SpO2 98 %     Weight      Height      Head Circumference      Peak Flow      Pain Score 8     Pain Loc      Pain Edu?      Excl. in [place]?    No data found.  Updated Vital Signs BP 124/78 (BP Location: Right Arm)   Pulse 96   Temp 98.3 F (36.8 C)   Resp 18   LMP 03/28/2021   SpO2 98%   Visual Acuity Right Eye Distance:   Left Eye Distance:   Bilateral Distance:    Right Eye Near:   Left Eye Near:    Bilateral Near:     Physical Exam Vitals and nursing note reviewed.  Constitutional:      Appearance: She is well-developed.     Comments: No acute distress  HENT:     Head: Normocephalic and atraumatic.     Nose: Nose normal.  Eyes:      Conjunctiva/sclera: Conjunctivae normal.  Cardiovascular:     Rate and Rhythm: Normal rate and regular rhythm.  Pulmonary:     Effort: Pulmonary effort is normal. No respiratory distress.     Comments: Breathing comfortably at rest, CTABL, no wheezing, rales or other adventitious sounds auscultated  Mild anterior chest tenderness to palpation along left sternal border Abdominal:     General: There is no distension.  Musculoskeletal:        General: Normal  range of motion.     Cervical back: Neck supple.     Comments: Dark appearing vein Noted to the medial aspect of right upper leg without associated swelling or erythema, no distal lower leg swelling or calf tenderness  Skin:    General: Skin is warm and dry.  Neurological:     Mental Status: She is alert and oriented to person, place, and time.      UC Treatments / Results  Labs (all labs ordered are listed, but only abnormal results are displayed) Labs Reviewed - No data to display  EKG   Radiology No results found.  Procedures Procedures (including critical care time)  Medications Ordered in UC Medications  ketorolac (TORADOL) 30 MG/ML injection 15 mg (15 mg Intramuscular Given 04/01/21 1235)    Initial Impression / Assessment and Plan / UC Course  I have reviewed the triage vital signs and the nursing notes.  Pertinent labs & imaging results that were available during my care of the patient were reviewed by me and considered in my medical decision making (see chart for details).     EKG normal sinus rhythm, no acute signs of ischemia or infarction, no signs of pericarditis, does have some mild reproducible tenderness on exam, suspect most likely MSK etiology, lungs clear, do not suspect pulmonary cause.  Encourage patient to continue to follow-up with PCP and cardiology as planned.  Patient to follow-up in emergency room for further work-up if symptoms progressing or worsening.  Patient currently asymptomatic  at discharge, did opt to provide Toradol 15 prior to discharge to help with any inflammation in chest and monitor symptoms with exertion over the next 24 to 48 hours.  Celebrex for future use.  Discussed strict return precautions. Patient verbalized understanding and is agreeable with plan.  Final Clinical Impressions(s) / UC Diagnoses   Final diagnoses:  Chest wall pain     Discharge Instructions     We gave you a shot of Toradol Continue with Celebrex twice daily with food Follow-up with primary care and cardiology as planned If symptoms progressing or worsening in the meantime please go to the emergency room    ED Prescriptions    Medication Sig Dispense Auth. Provider   celecoxib (CELEBREX) 100 MG capsule Take 1 capsule (100 mg total) by mouth 2 (two) times daily. 20 capsule Shakya Sebring, [place] C, PA-C     PDMP not reviewed this encounter.   Janith Lima, [place] 04/01/21 1317

## 2021-04-01 NOTE — Discharge Instructions (Signed)
We gave you a shot of Toradol Continue with Celebrex twice daily with food Follow-up with primary care and cardiology as planned If symptoms progressing or worsening in the meantime please go to the emergency room

## 2021-04-01 NOTE — ED Triage Notes (Signed)
One week h/o chest pain with sob.  Pt reports that she was sweeping her floor last night when she felt a sudden onset of increased chest pain and sob. She took Tylenol and rested. Pain increased in intensity around 4am today and has been "achey" since then.  Confirms lightheadedness during episodes. Denies sweating and chills. Describes pain as "chest caved in".  Pain increases on movement.  Has been worked up by cardio, but reports that she has not followed up for a stress test yet. Had an iron infusion last month and reports that she felt better afterwards. Great Grandmother had a hx of angina. No other family hx of cardiac issues.

## 2021-04-01 NOTE — Telephone Encounter (Signed)
Pt c/o of Chest Pain: STAT if CP now or developed within 24 hours  1. Are you having CP right now? Yes  2. Are you experiencing any other symptoms (ex. SOB, nausea, vomiting, sweating)? Nausea  3. How long have you been experiencing CP? 03/26/21  4. Is your CP continuous or coming and going? Coming and going  5. Have you taken Nitroglycerin?  No, Pt took Nisedipine and  Tylenol last night    Pt was seen by Dr. Gasper Sells 1X time and did not return because she was getting iron infusions and she's been feeling better. ?

## 2021-04-01 NOTE — Telephone Encounter (Signed)
Spoke with patient in regards to CP.  She reports that CP has occurred for about a week.  She describes it as achy all over her chest front and back.  Pain is excruciating and wakes her up out of her sleep.  She also reports that she can't sweep the floor without becoming SOB.  She took nifedipine and tylenol last night with no relief.  She reports that she recently had the flu.  Denies increased cough.  Today she reports that she feels scared like something is going to happen.  She feels nauseous at this time.  Reports BP checked 2 weeks ago and she doesn't remember what it was.   She did not complete follow up testing requested by Dr. Gasper Sells because she felt better.  She is requesting a f/u appointment.  I advised her to go to the ED for evaluation.  Chest pain with associated nausea is concerning.  She is agreeable to recommendation of going to the ED.

## 2021-04-03 ENCOUNTER — Encounter: Payer: Self-pay | Admitting: Internal Medicine

## 2021-04-03 ENCOUNTER — Ambulatory Visit: Payer: Medicaid Other | Admitting: Internal Medicine

## 2021-04-03 ENCOUNTER — Other Ambulatory Visit: Payer: Self-pay

## 2021-04-03 VITALS — BP 110/72 | HR 82 | Temp 97.7°F | Resp 15 | Ht 60.98 in | Wt 150.6 lb

## 2021-04-03 DIAGNOSIS — F411 Generalized anxiety disorder: Secondary | ICD-10-CM

## 2021-04-03 DIAGNOSIS — Z30016 Encounter for initial prescription of transdermal patch hormonal contraceptive device: Secondary | ICD-10-CM

## 2021-04-03 DIAGNOSIS — R0789 Other chest pain: Secondary | ICD-10-CM | POA: Diagnosis not present

## 2021-04-03 DIAGNOSIS — D509 Iron deficiency anemia, unspecified: Secondary | ICD-10-CM

## 2021-04-03 DIAGNOSIS — F319 Bipolar disorder, unspecified: Secondary | ICD-10-CM | POA: Insufficient documentation

## 2021-04-03 DIAGNOSIS — F419 Anxiety disorder, unspecified: Secondary | ICD-10-CM | POA: Insufficient documentation

## 2021-04-03 MED ORDER — CELECOXIB 100 MG PO CAPS: 100.0000 mg | ORAL_CAPSULE | Freq: Two times a day (BID) | ORAL | 1 refills | Status: DC

## 2021-04-03 MED ORDER — ESTRADIOL 0.05 MG/24HR TD PTWK: 0.0500 mg | MEDICATED_PATCH | TRANSDERMAL | 11 refills | Status: DC

## 2021-04-03 NOTE — Progress Notes (Signed)
Chest pressure UC visit 04/01/21 Wants to make sure Nuvaring not causing chest pressure  Wants iron levels checked  Lower back and leg pain

## 2021-04-03 NOTE — Progress Notes (Signed)
Subjective:    Erika Stephens - 30 y.o. female MRN [id number]  Date of birth: [birth year]  HPI  Erika Stephens is here for follow up for chest wall pain. She went to the Urgent Care on 5/24. Her EKG was normal. Pain was reproducible on exam so suspected to be msk in nature. She was given Toradol injection. Also given prescription for Celebrex. She reports she is still having that chest pressure.   She reports that she had a breakdown before coming into the doctor's office today. She is stressed out about the mass shootings of children and worried about her safety of her kids. She is a single parent and she is having a hard time building a community here. Her family lives in [place]. She has taken Wellbutrin and Trazodone in the past. She is very anxious and wants to talk to someone.   Health Maintenance:  There are no preventive care reminders to display for this patient.  -  reports that she has never smoked. She has never used smokeless tobacco. - Review of Systems: Per HPI. - Past Medical History: Patient Active Problem List   Diagnosis Date Noted  . Anxiety disorder 04/03/2021  . Bipolar disorder ([place]) 04/03/2021  . Hemorrhoids 12/26/2020  . Chest pain of uncertain etiology 09/98/3382  . Essential hypertension 12/25/2020  . History of pre-eclampsia 12/25/2020  . ASCUS of cervix with negative high risk HPV 11/27/2020  . Iron deficiency anemia 11/14/2020  . Fecal urgency 10/16/2020  . Nasal sore 10/16/2020  . Crohn's disease of both small and large intestine with other complication ([place]) 50/53/9767  . History of iron deficiency anemia 03/08/2020  . Infliximab (Remicade) long-term use 03/08/2020  . History of immunosuppression therapy 03/08/2020  . Generalized abdominal pain 03/08/2020  . Bloating symptom 03/08/2020  . Chronic diarrhea 03/08/2020  . Cyst of ovary 12/21/2016  . Crohn's disease ([employer]) 11/27/2015   - Medications: reviewed and updated   Objective:   Physical Exam BP  110/72 (BP Location: Right Arm, Patient Position: Sitting, Cuff Size: Normal)   Pulse 82   Temp 97.7 F (36.5 C)   Resp 15   Ht 5' 0.98" (1.549 m)   Wt 150 lb 9.6 oz (68.3 kg)   LMP 03/28/2021   SpO2 97%   BMI 28.47 kg/m  Physical Exam Constitutional:      General: She is not in acute distress.    Appearance: She is not diaphoretic.  Cardiovascular:     Rate and Rhythm: Normal rate.  Pulmonary:     Effort: Pulmonary effort is normal. No respiratory distress.  Musculoskeletal:        General: Normal range of motion.  Skin:    General: Skin is warm and dry.  Neurological:     Mental Status: She is alert and oriented to person, place, and time.  Psychiatric:        Mood and Affect: Affect normal.        Judgment: Judgment normal.     Assessment & Plan:   1. Chest wall pain Provided reassurance for patient about her source of pain. Suspect msk. Continue anti-inflammatory. She is established with cardiology and encouraged her to f/u if that would give peace of mind.  - celecoxib (CELEBREX) 100 MG capsule; Take 1 capsule (100 mg total) by mouth 2 (two) times daily.  Dispense: 30 capsule; Refill: 1  2. Generalized anxiety disorder Patient suffering from a lot of anxiety. Suspect would benefit from counseling, referral placed. We discussed  medication options but she would like to hold off for now.  - Ambulatory referral to Psychology  3. Encounter for initial prescription of transdermal patch hormonal contraceptive device Is experiencing a lot of cramping with Nuvaring. Discontinue and start birth control patch. Discussed how to use and potential side effects. Discussed other options for contraception. She does not do well with taking pills. Not interested in IUD given concerns could cause cramping. We discussed Nexplanon and Depo but she wants to have a regular menstrual cycle.  - estradiol (CLIMARA - DOSED IN MG/24 HR) 0.05 mg/24hr patch; Place 1 patch (0.05 mg total) onto the  skin once a week.  Dispense: 4 patch; Refill: 11  4. Iron deficiency anemia, unspecified iron deficiency anemia type Last HgB 9.2 in March 2022. She was trying to take Fe supplement BID but not tolerating due to constipation with h/o Crohn's disease. Will repeat CBC today especially given if very anemic potential cause of her chest discomfort. Discussed that taking Fe every other day has led to same success in rebuilding iron stores with less side effects.  - CBC   Phill Myron, D.O. 04/03/2021, 11:02 AM Primary Care at [facility]

## 2021-04-04 LAB — CBC
Hematocrit: 40.4 % (ref 34.0–46.6)
Hemoglobin: 13.2 g/dL (ref 11.1–15.9)
MCH: 28.7 pg (ref 26.6–33.0)
MCHC: 32.7 g/dL (ref 31.5–35.7)
MCV: 88 fL (ref 79–97)
Platelets: 303 10*3/uL (ref 150–450)
RBC: 4.6 x10E6/uL (ref 3.77–5.28)
RDW: 14.5 % (ref 11.7–15.4)
WBC: 8.4 10*3/uL (ref 3.4–10.8)

## 2021-04-23 ENCOUNTER — Other Ambulatory Visit: Payer: Self-pay

## 2021-04-23 ENCOUNTER — Ambulatory Visit
Admission: EM | Admit: 2021-04-23 | Discharge: 2021-04-23 | Disposition: A | Discharge status: Home / Self Care | Payer: Medicaid Other | Attending: Emergency Medicine | Admitting: Emergency Medicine

## 2021-04-23 DIAGNOSIS — R350 Frequency of micturition: Secondary | ICD-10-CM | POA: Diagnosis not present

## 2021-04-23 LAB — POCT URINALYSIS DIP (MANUAL ENTRY)
Bilirubin, UA: NEGATIVE
Glucose, UA: NEGATIVE mg/dL
Ketones, POC UA: NEGATIVE mg/dL
Leukocytes, UA: NEGATIVE
Nitrite, UA: NEGATIVE
Protein Ur, POC: NEGATIVE mg/dL
Spec Grav, UA: >=1.03 — AB (ref 1.010–1.025)
Urobilinogen, UA: 0.2 E.U./dL
pH, UA: 5.5 (ref 5.0–8.0)

## 2021-04-23 LAB — POCT URINE PREGNANCY: Preg Test, Ur: NEGATIVE

## 2021-04-23 MED ORDER — TAMSULOSIN HCL 0.4 MG PO CAPS: 0.4000 mg | ORAL_CAPSULE | Freq: Every day | ORAL | 0 refills | Status: AC

## 2021-04-23 MED ORDER — NAPROXEN 500 MG PO TABS: 500.0000 mg | ORAL_TABLET | Freq: Two times a day (BID) | ORAL | 0 refills | Status: DC

## 2021-04-23 NOTE — ED Provider Notes (Signed)
[name]    [name]: [id number] Arrival date & time: 04/23/21  0844      History   Chief Complaint Chief Complaint  Patient presents with   Urinary Urgency   Urinary Frequency    HPI Erika Stephens is a 30 y.o. female history of hypertension, Crohn's disease, presenting today for evaluation of possible UTI.  Reports urinary frequency and urgency over the past month, worsened in the past 1 to 2 days.  Slight dysuria at end of stream.  Denies vaginal symptoms.  Slight lower abdominal discomfort bilaterally with changing positions.  Psych cramping sensation.  Last menstrual cycle 03/28/2021.  Denies any current bleeding.  Previously on birth control patch, but stopped using this.  Denies history of kidney stones.  Denies significant back pain.  Denies hematuria.  Reports smoking marijuana using tobacco wraps, but denies any cigarette use.  HPI  Past Medical History:  Diagnosis Date   Anemia    Anxiety    Blood transfusion without reported diagnosis    Central centrifugal scarring alopecia    Chronic swimmer's ear of right side    Crohn's colitis ([place])    Hypertension    Phreesia 09/10/2020   IDA (iron deficiency anemia)    Impetigo    Postinflammatory hyperpigmentation    Scalp psoriasis    Seborrheic dermatitis of scalp     Patient Active Problem List   Diagnosis Date Noted   Anxiety disorder 04/03/2021   Bipolar disorder ([place]) 04/03/2021   Hemorrhoids 12/26/2020   Chest pain of uncertain etiology 52/84/1324   Essential hypertension 12/25/2020   History of pre-eclampsia 12/25/2020   ASCUS of cervix with negative high risk HPV 11/27/2020   Iron deficiency anemia 11/14/2020   Fecal urgency 10/16/2020   Nasal sore 10/16/2020   Crohn's disease of both small and large intestine with other complication ([place]) 40/08/2724   History of iron deficiency anemia 03/08/2020   Infliximab (Remicade) long-term use 03/08/2020   History of immunosuppression therapy  03/08/2020   Generalized abdominal pain 03/08/2020   Bloating symptom 03/08/2020   Chronic diarrhea 03/08/2020   Cyst of ovary 12/21/2016   Crohn's disease ([place]) 11/27/2015    Past Surgical History:  Procedure Laterality Date   APPENDECTOMY     BOWEL RESECTION     COLONOSCOPY     UPPER GASTROINTESTINAL ENDOSCOPY      OB History   No obstetric history on file.      Home Medications    Prior to Admission medications   Medication Sig Start Date End Date Taking? Authorizing Provider  naproxen (NAPROSYN) 500 MG tablet Take 1 tablet (500 mg total) by mouth 2 (two) times daily. 04/23/21  Yes Bobby Ragan C, PA-C  tamsulosin (FLOMAX) 0.4 MG CAPS capsule Take 1 capsule (0.4 mg total) by mouth daily for 7 days. 04/23/21 04/30/21 Yes Chon Buhl C, PA-C  celecoxib (CELEBREX) 100 MG capsule Take 1 capsule (100 mg total) by mouth 2 (two) times daily. 04/03/21   [name], MD  Cetirizine HCl 10 MG CAPS Take 1 capsule (10 mg total) by mouth daily for 10 days. 03/11/21 03/21/21  Melvenia Favela C, PA-C  fluticasone (FLONASE) 50 MCG/ACT nasal spray Place 1-2 sprays into both nostrils daily. 03/11/21   Lewellyn Fultz C, PA-C  inFLIXimab (REMICADE IV) Inject into the vein. Every 8 weeks    [provider]  Multiple Vitamin (MULTIVITAMIN ADULT PO) Take by mouth daily.    [provider]  Multiple Vitamins-Minerals (  HAIR SKIN AND NAILS FORMULA) TABS Take 1 tablet by mouth daily.    [provider]    Family History Family History  Problem Relation Age of Onset   Crohn's disease Mother    Schizophrenia Father    Bipolar disorder Father    Crohn's disease Maternal Grandmother    Prostate cancer Maternal Uncle    Colon cancer Neg Hx    Stomach cancer Neg Hx    Pancreatic cancer Neg Hx    Esophageal cancer Neg Hx    Inflammatory bowel disease Neg Hx    Liver disease Neg Hx    Rectal cancer Neg Hx     Social History Social History   Tobacco Use    Smoking status: Never   Smokeless tobacco: Never  Vaping Use   Vaping Use: Never used  Substance Use Topics   Alcohol use: Yes    Comment: socially.    Drug use: Not Currently    Types: Marijuana    Comment: last used 04-17-20 per pt     Allergies   Adalimumab and Aspirin   Review of Systems Review of Systems  Constitutional:  Negative for fever.  Respiratory:  Negative for shortness of breath.   Cardiovascular:  Negative for chest pain.  Gastrointestinal:  Negative for abdominal pain, diarrhea, nausea and vomiting.  Genitourinary:  Positive for frequency and urgency. Negative for dysuria, flank pain, genital sores, hematuria, menstrual problem, vaginal bleeding, vaginal discharge and vaginal pain.  Musculoskeletal:  Negative for back pain.  Skin:  Negative for rash.  Neurological:  Negative for dizziness, light-headedness and headaches.    Physical Exam Triage Vital Signs ED Triage Vitals  Enc Vitals Group     BP      Pulse      Resp      Temp      Temp src      SpO2      Weight      Height      Head Circumference      Peak Flow      Pain Score      Pain Loc      Pain Edu?      Excl. in [place]?    No data found.  Updated Vital Signs BP 116/78 (BP Location: Left Arm)   Pulse 62   Temp 97.8 F (36.6 C) (Oral)   Resp 18   LMP 03/28/2021   SpO2 98%   Visual Acuity Right Eye Distance:   Left Eye Distance:   Bilateral Distance:    Right Eye Near:   Left Eye Near:    Bilateral Near:     Physical Exam Vitals and nursing note reviewed.  Constitutional:      Appearance: She is well-developed.     Comments: No acute distress  HENT:     Head: Normocephalic and atraumatic.     Nose: Nose normal.  Eyes:     Conjunctiva/sclera: Conjunctivae normal.  Cardiovascular:     Rate and Rhythm: Normal rate.  Pulmonary:     Effort: Pulmonary effort is normal. No respiratory distress.  Abdominal:     General: There is no distension.     Comments: Soft,  nondistended, nontender lightly palpation throughout abdomen, no CVA tenderness  Musculoskeletal:        General: Normal range of motion.     Cervical back: Neck supple.  Skin:    General: Skin is warm and dry.  Neurological:  Mental Status: She is alert and oriented to person, place, and time.     UC Treatments / Results  Labs (all labs ordered are listed, but only abnormal results are displayed) Labs Reviewed  POCT URINALYSIS DIP (MANUAL ENTRY) - Abnormal; Notable for the following components:      Result Value   Spec Grav, UA >=1.030 (*)    Blood, UA moderate (*)    All other components within normal limits  URINE CULTURE  POCT URINE PREGNANCY  CERVICOVAGINAL ANCILLARY ONLY    EKG   Radiology No results found.  Procedures Procedures (including critical care time)  Medications Ordered in UC Medications - No data to display  Initial Impression / Assessment and Plan / UC Course  I have reviewed the triage vital signs and the nursing notes.  Pertinent labs & imaging results that were available during my care of the patient were reviewed by me and considered in my medical decision making (see chart for details).     Urinary hesitancy/frequency-UA negative for leuks and nitrites, does have moderate hemoglobin, denies any vaginal bleeding, but is due to start menstrual cycle in the next few days.  Questionable underlying stone given blood on UA.  Will send for urine culture and have vaginal swab to further rule out any pelvic infections contributing to symptoms.  Will empirically provide Flomax and strainer will continue to monitor urinary symptoms.  If symptoms persisting, not improving, please follow-up with PCP for recheck of urine for hematuria, possible urology referral.  Discussed strict return precautions. Patient verbalized understanding and is agreeable with plan.  Final Clinical Impressions(s) / UC Diagnoses   Final diagnoses:  Urinary frequency      Discharge Instructions      Urine does not show signs of infection.  Urine culture and vaginal swab pending to further check for any infection contributing to symptoms Please use Tylenol as needed, begin Flomax daily and strain urine With possible underlying stone If symptoms persisting, please follow-up with primary care for recheck of urine      ED Prescriptions     Medication Sig Dispense Auth. Provider   tamsulosin (FLOMAX) 0.4 MG CAPS capsule Take 1 capsule (0.4 mg total) by mouth daily for 7 days. 7 capsule Kierah Goatley C, PA-C   naproxen (NAPROSYN) 500 MG tablet Take 1 tablet (500 mg total) by mouth 2 (two) times daily. 30 tablet Keshona Kartes, [place] C, PA-C      PDMP not reviewed this encounter.   Janith Lima, PA-C 04/23/21 1104

## 2021-04-23 NOTE — Discharge Instructions (Addendum)
Urine does not show signs of infection.  Urine culture and vaginal swab pending to further check for any infection contributing to symptoms Please use Tylenol as needed, begin Flomax daily and strain urine With possible underlying stone If symptoms persisting, please follow-up with primary care for recheck of urine

## 2021-04-23 NOTE — ED Triage Notes (Signed)
Over 1 wk h/o urinary urgency, dysuria and urinary frequency that has worsened in the last day. Confirms intermittent abdominal pain. Denies constipation and n/v. Pt concerned for a UTI.

## 2021-04-24 LAB — CERVICOVAGINAL ANCILLARY ONLY
Bacterial Vaginitis (gardnerella): NEGATIVE
Candida Glabrata: NEGATIVE
Candida Vaginitis: NEGATIVE
Chlamydia: NEGATIVE
Comment: NEGATIVE
Comment: NEGATIVE
Comment: NEGATIVE
Comment: NEGATIVE
Comment: NEGATIVE
Comment: NORMAL
Neisseria Gonorrhea: NEGATIVE
Trichomonas: NEGATIVE

## 2021-04-26 LAB — URINE CULTURE: Culture: 10000 — AB

## 2021-05-01 ENCOUNTER — Other Ambulatory Visit: Payer: Self-pay

## 2021-05-01 ENCOUNTER — Encounter: Payer: Self-pay | Admitting: Radiology

## 2021-05-01 ENCOUNTER — Ambulatory Visit (INDEPENDENT_AMBULATORY_CARE_PROVIDER_SITE_OTHER): Payer: Medicaid Other

## 2021-05-01 DIAGNOSIS — R079 Chest pain, unspecified: Secondary | ICD-10-CM | POA: Diagnosis not present

## 2021-05-01 LAB — EXERCISE TOLERANCE TEST
Estimated workload: 10.1 METS
Exercise duration (min): 9 min
Exercise duration (sec): 0 s
MPHR: 190 {beats}/min
Peak HR: 162 {beats}/min
Percent HR: 85 %
RPE: 17
Rest HR: 71 {beats}/min

## 2021-05-07 ENCOUNTER — Non-Acute Institutional Stay (HOSPITAL_COMMUNITY)
Admission: RE | Admit: 2021-05-07 | Discharge: 2021-05-07 | Disposition: A | Discharge status: Home / Self Care | Payer: Medicaid Other | Source: Ambulatory Visit | Attending: Internal Medicine | Admitting: Internal Medicine

## 2021-05-07 ENCOUNTER — Other Ambulatory Visit: Payer: Self-pay

## 2021-05-07 DIAGNOSIS — K5090 Crohn's disease, unspecified, without complications: Secondary | ICD-10-CM | POA: Insufficient documentation

## 2021-05-07 MED ORDER — ACETAMINOPHEN 325 MG PO TABS
650.0000 mg | ORAL_TABLET | Freq: Once | ORAL | Status: AC
  Administered 2021-05-07: 650 mg via ORAL
  Filled 2021-05-07: qty 2

## 2021-05-07 MED ORDER — SODIUM CHLORIDE 0.9 % IV SOLN
700.0000 mg | INTRAVENOUS | Status: DC
  Administered 2021-05-07: 700 mg via INTRAVENOUS
  Filled 2021-05-07: qty 70

## 2021-05-07 MED ORDER — SODIUM CHLORIDE 0.9 % IV SOLN
INTRAVENOUS | Status: DC | PRN
  Administered 2021-05-07: 250 mL via INTRAVENOUS

## 2021-05-07 MED ORDER — DIPHENHYDRAMINE HCL 25 MG PO CAPS
25.0000 mg | ORAL_CAPSULE | Freq: Once | ORAL | Status: AC
  Administered 2021-05-07: 25 mg via ORAL
  Filled 2021-05-07: qty 1

## 2021-05-07 NOTE — Progress Notes (Signed)
PATIENT CARE CENTER NOTE   Diagnosis: Crohn's disease     Provider: Justice Britain MD     Procedure: Remicade infusion     Note: Patient received Remicade (dose #2 of 6) infusion via PIV. Patient premedicated with PO Tylenol and Benadryl as ordered. Infusion titrated per protocol. Patient tolerated well with no adverse reaction. Vital signs stable. Pt declined AVS, and declined to stay for 1 hour observation post infusion.  Patient to come back every 8 weeks for infusion, instructed to make next appointment with scheduler prior to leaving, verbalized understanding. Alert, oriented and ambulatory at discharge.

## 2021-06-02 NOTE — Progress Notes (Signed)
Patient did not show for appointment.   

## 2021-06-03 ENCOUNTER — Encounter: Payer: Medicaid Other | Admitting: Family

## 2021-07-02 DIAGNOSIS — L710 Perioral dermatitis: Secondary | ICD-10-CM | POA: Diagnosis not present

## 2021-07-02 DIAGNOSIS — L821 Other seborrheic keratosis: Secondary | ICD-10-CM | POA: Diagnosis not present

## 2021-07-07 ENCOUNTER — Other Ambulatory Visit: Payer: Self-pay

## 2021-07-07 ENCOUNTER — Non-Acute Institutional Stay (HOSPITAL_COMMUNITY)
Admission: RE | Admit: 2021-07-07 | Discharge: 2021-07-07 | Disposition: A | Discharge status: Home / Self Care | Payer: Medicaid Other | Source: Ambulatory Visit | Attending: Internal Medicine | Admitting: Internal Medicine

## 2021-07-07 DIAGNOSIS — K5090 Crohn's disease, unspecified, without complications: Secondary | ICD-10-CM | POA: Insufficient documentation

## 2021-07-07 MED ORDER — SODIUM CHLORIDE 0.9 % IV SOLN
INTRAVENOUS | Status: DC | PRN
  Administered 2021-07-07: 250 mL via INTRAVENOUS

## 2021-07-07 MED ORDER — ACETAMINOPHEN 325 MG PO TABS
650.0000 mg | ORAL_TABLET | Freq: Once | ORAL | Status: AC
  Administered 2021-07-07: 650 mg via ORAL
  Filled 2021-07-07: qty 2

## 2021-07-07 MED ORDER — DIPHENHYDRAMINE HCL 25 MG PO CAPS
25.0000 mg | ORAL_CAPSULE | Freq: Once | ORAL | Status: AC
  Administered 2021-07-07: 50 mg via ORAL
  Filled 2021-07-07: qty 2

## 2021-07-07 MED ORDER — SODIUM CHLORIDE 0.9 % IV SOLN
700.0000 mg | INTRAVENOUS | Status: DC
  Administered 2021-07-07: 700 mg via INTRAVENOUS
  Filled 2021-07-07: qty 70

## 2021-07-07 NOTE — Progress Notes (Signed)
PATIENT CARE CENTER NOTE   Diagnosis: Crohn's disease     Provider:  Justice Britain MD     Procedure: Remicade infusion 729m     Note: Patient received Remicade (dose #3 of 6) infusion via PIV. Pt premedicated with 650 mg Tylenol and 50 mg Benadryl PO as ordered. Infusion titrated per protocol. Patient tolerated well with no adverse reaction. Vital signs stable.Pt declined AVS, declined to stay for 1 hour observation. Patient to come back every 8 weeks for infusion. Alert, oriented and ambulatory at discharge.

## 2021-07-21 ENCOUNTER — Other Ambulatory Visit: Payer: Self-pay

## 2021-07-21 ENCOUNTER — Ambulatory Visit
Admission: EM | Admit: 2021-07-21 | Discharge: 2021-07-21 | Disposition: A | Discharge status: Home / Self Care | Payer: Medicaid Other | Attending: Urgent Care | Admitting: Urgent Care

## 2021-07-21 DIAGNOSIS — Z9225 Personal history of immunosupression therapy: Secondary | ICD-10-CM | POA: Insufficient documentation

## 2021-07-21 DIAGNOSIS — Z8709 Personal history of other diseases of the respiratory system: Secondary | ICD-10-CM

## 2021-07-21 DIAGNOSIS — R058 Other specified cough: Secondary | ICD-10-CM

## 2021-07-21 DIAGNOSIS — J069 Acute upper respiratory infection, unspecified: Secondary | ICD-10-CM | POA: Insufficient documentation

## 2021-07-21 DIAGNOSIS — R070 Pain in throat: Secondary | ICD-10-CM | POA: Insufficient documentation

## 2021-07-21 DIAGNOSIS — K50919 Crohn's disease, unspecified, with unspecified complications: Secondary | ICD-10-CM | POA: Diagnosis not present

## 2021-07-21 DIAGNOSIS — Z20822 Contact with and (suspected) exposure to covid-19: Secondary | ICD-10-CM | POA: Insufficient documentation

## 2021-07-21 LAB — POCT RAPID STREP A (OFFICE): Rapid Strep A Screen: NEGATIVE

## 2021-07-21 MED ORDER — PSEUDOEPHEDRINE HCL 60 MG PO TABS: 60.0000 mg | ORAL_TABLET | Freq: Three times a day (TID) | ORAL | 0 refills | Status: DC | PRN

## 2021-07-21 MED ORDER — CETIRIZINE HCL 10 MG PO TABS: 10.0000 mg | ORAL_TABLET | Freq: Every day | ORAL | 0 refills | Status: DC

## 2021-07-21 MED ORDER — PROMETHAZINE-DM 6.25-15 MG/5ML PO SYRP: 5.0000 mL | ORAL_SOLUTION | Freq: Every evening | ORAL | 0 refills | Status: DC | PRN

## 2021-07-21 MED ORDER — BENZONATATE 100 MG PO CAPS: 100.0000 mg | ORAL_CAPSULE | Freq: Three times a day (TID) | ORAL | 0 refills | Status: DC | PRN

## 2021-07-21 NOTE — Discharge Instructions (Signed)
We will notify you of your COVID-19 test results as they arrive and may take between 48-72 hours.  I encourage you to sign up for MyChart if you have not already done so as this can be the easiest way for [place] to communicate results to you online or through a phone app.  Generally, we only contact you if it is a positive COVID result.  In the meantime, if you develop worsening symptoms including fever, chest pain, shortness of breath despite our current treatment plan then please report to the emergency room as this may be a sign of worsening status from possible COVID-19 infection.  Otherwise, we will manage this as a viral syndrome. For sore throat or cough try using a honey-based tea. Use 3 teaspoons of honey with juice squeezed from half lemon. Place shaved pieces of ginger into 1/2-1 cup of water and warm over stove top. Then mix the ingredients and repeat every 4 hours as needed. Please take Tylenol 557m-650mg every 6 hours for aches and pains, fevers. Hydrate very well with at least 2 liters of water. Eat light meals such as soups to replenish electrolytes and soft fruits, veggies. Start an antihistamine like Zyrtec, Allegra or Claritin for postnasal drainage, sinus congestion.  You can take this together with pseudoephedrine (Sudafed) at a dose of 60 mg 2-3 times a day as needed for the same kind of congestion.

## 2021-07-21 NOTE — ED Provider Notes (Signed)
[place]   MRN: [id number] DOB: 10/10/1991  Subjective:   Erika Stephens is a 30 y.o. female presenting for 1 week history of persistent malaise and fatigue, throat pain, cough, severe sinus congestion.  Patient had COVID exposure fall last week.  She also wants to be checked for strep throat given her history of strep.  She has a history of Crohn's and is on medications to suppress her immune system.  Denies chest pain, shortness of breath, wheezing.  Has been using over-the-counter medications with minimal relief.  No current facility-administered medications for this encounter.  Current Outpatient Medications:    celecoxib (CELEBREX) 100 MG capsule, Take 1 capsule (100 mg total) by mouth 2 (two) times daily., Disp: 30 capsule, Rfl: 1   Cetirizine HCl 10 MG CAPS, Take 1 capsule (10 mg total) by mouth daily for 10 days., Disp: 10 capsule, Rfl: 0   fluticasone (FLONASE) 50 MCG/ACT nasal spray, Place 1-2 sprays into both nostrils daily., Disp: 16 g, Rfl: 0   inFLIXimab (REMICADE IV), Inject into the vein. Every 8 weeks, Disp: , Rfl:    Multiple Vitamin (MULTIVITAMIN ADULT PO), Take by mouth daily., Disp: , Rfl:    Multiple Vitamins-Minerals (HAIR SKIN AND NAILS FORMULA) TABS, Take 1 tablet by mouth daily., Disp: , Rfl:    naproxen (NAPROSYN) 500 MG tablet, Take 1 tablet (500 mg total) by mouth 2 (two) times daily., Disp: 30 tablet, Rfl: 0   Allergies  Allergen Reactions   Adalimumab Swelling   Aspirin Hives    Past Medical History:  Diagnosis Date   Anemia    Anxiety    Blood transfusion without reported diagnosis    Central centrifugal scarring alopecia    Chronic swimmer's ear of right side    Crohn's colitis ([place])    Hypertension    Phreesia 09/10/2020   IDA (iron deficiency anemia)    Impetigo    Postinflammatory hyperpigmentation    Scalp psoriasis    Seborrheic dermatitis of scalp      Past Surgical History:  Procedure Laterality Date   APPENDECTOMY      BOWEL RESECTION     COLONOSCOPY     UPPER GASTROINTESTINAL ENDOSCOPY      Family History  Problem Relation Age of Onset   Crohn's disease Mother    Schizophrenia Father    Bipolar disorder Father    Crohn's disease Maternal Grandmother    Prostate cancer Maternal Uncle    Colon cancer Neg Hx    Stomach cancer Neg Hx    Pancreatic cancer Neg Hx    Esophageal cancer Neg Hx    Inflammatory bowel disease Neg Hx    Liver disease Neg Hx    Rectal cancer Neg Hx     Social History   Tobacco Use   Smoking status: Never   Smokeless tobacco: Never  Vaping Use   Vaping Use: Never used  Substance Use Topics   Alcohol use: Yes    Comment: socially.    Drug use: Not Currently    Types: Marijuana    Comment: last used 04-17-20 per pt    ROS   Objective:   Vitals: BP 139/87 (BP Location: Left Arm)   Pulse 73   Temp 98.4 F (36.9 C) (Oral)   Resp 18   LMP 07/08/2021   SpO2 98%   Physical Exam Constitutional:      General: She is not in acute distress.    Appearance: Normal appearance. She is  well-developed. She is not ill-appearing, toxic-appearing or diaphoretic.  HENT:     Head: Normocephalic and atraumatic.     Right Ear: Tympanic membrane and ear canal normal. No drainage or tenderness. No middle ear effusion. Tympanic membrane is not erythematous.     Left Ear: Tympanic membrane and ear canal normal. No drainage or tenderness.  No middle ear effusion. Tympanic membrane is not erythematous.     Nose: Nose normal. No congestion or rhinorrhea.     Mouth/Throat:     Mouth: Mucous membranes are moist. No oral lesions.     Pharynx: No pharyngeal swelling, oropharyngeal exudate, posterior oropharyngeal erythema or uvula swelling.     Tonsils: No tonsillar exudate or tonsillar abscesses.  Eyes:     Extraocular Movements: Extraocular movements intact.     Right eye: Normal extraocular motion.     Left eye: Normal extraocular motion.     Conjunctiva/sclera: Conjunctivae  normal.     Pupils: Pupils are equal, round, and reactive to light.  Cardiovascular:     Rate and Rhythm: Normal rate and regular rhythm.     Pulses: Normal pulses.     Heart sounds: Normal heart sounds. No murmur heard.   No friction rub. No gallop.  Pulmonary:     Effort: Pulmonary effort is normal. No respiratory distress.     Breath sounds: Normal breath sounds. No stridor. No wheezing, rhonchi or rales.  Musculoskeletal:     Cervical back: Normal range of motion and neck supple.  Lymphadenopathy:     Cervical: No cervical adenopathy.  Skin:    General: Skin is warm and dry.     Findings: No rash.  Neurological:     General: No focal deficit present.     Mental Status: She is alert and oriented to person, place, and time.  Psychiatric:        Mood and Affect: Mood normal.        Behavior: Behavior normal.        Thought Content: Thought content normal.    Results for orders placed or performed during the hospital encounter of 07/21/21 (from the past 24 hour(s))  POCT rapid strep A     Status: None   Collection Time: 07/21/21  8:30 AM  Result Value Ref Range   Rapid Strep A Screen Negative Negative    Assessment and Plan :   PDMP not reviewed this encounter.  1. Viral URI with cough   2. Throat pain   3. Cough with exposure to COVID-19 virus   4. History of strep sore throat   5. History of immunosuppression therapy   6. Crohn's disease with complication, unspecified gastrointestinal tract location [facility])     Will manage for viral illness such as viral URI, viral syndrome, viral rhinitis, COVID-19, viral pharyngitis. Counseled patient on nature of COVID-19 including modes of transmission, diagnostic testing, management and supportive care.  Offered scripts for symptomatic relief. COVID 19 and strep culture are pending. Counseled patient on potential for adverse effects with medications prescribed/recommended today, ER and return-to-clinic precautions discussed, patient  verbalized understanding.     Jaynee Eagles, [place] 07/21/21 [phone]

## 2021-07-21 NOTE — ED Triage Notes (Signed)
Pt c/o cough, sore throat, and runny nose since last week. States exposed to covid last week. States her tonsils are swollen and hx of strep. Taking OTC meds with no relief.

## 2021-07-22 LAB — SARS-COV-2, NAA 2 DAY TAT

## 2021-07-22 LAB — NOVEL CORONAVIRUS, NAA: SARS-CoV-2, NAA: NOT DETECTED

## 2021-07-23 ENCOUNTER — Other Ambulatory Visit: Payer: Self-pay

## 2021-07-23 ENCOUNTER — Ambulatory Visit
Admission: EM | Admit: 2021-07-23 | Discharge: 2021-07-23 | Disposition: A | Discharge status: Home / Self Care | Payer: Medicaid Other | Attending: Internal Medicine | Admitting: Internal Medicine

## 2021-07-23 DIAGNOSIS — J029 Acute pharyngitis, unspecified: Secondary | ICD-10-CM

## 2021-07-23 DIAGNOSIS — Z113 Encounter for screening for infections with a predominantly sexual mode of transmission: Secondary | ICD-10-CM | POA: Diagnosis not present

## 2021-07-23 MED ORDER — CEFDINIR 300 MG PO CAPS
300.0000 mg | ORAL_CAPSULE | Freq: Two times a day (BID) | ORAL | 0 refills | Status: AC
Start: 2021-07-23 — End: 2021-08-02

## 2021-07-23 NOTE — ED Triage Notes (Signed)
Pt c/o sore throat x2 wks. Seen here on Monday for same.

## 2021-07-23 NOTE — Discharge Instructions (Addendum)
Your throat culture is still pending.  Your STD swab is also pending.  We will call you if either one of these are positive.  You have been prescribed an antibiotic to help treat sore throat. Please take this medication with food.

## 2021-07-23 NOTE — ED Provider Notes (Signed)
[name]    [name]: [id number] Arrival date & time: 07/23/21  1696      History   Chief Complaint Chief Complaint  Patient presents with   Sore Throat    HPI Erika Stephens is a 30 y.o. female.   Patient presents with 2-week history of sore throat.  Patient was seen on 07/21/2021 for same sore throat.  Rapid strep and COVID-19 test were negative.  Throat culture still pending.  Patient was prescribed medications to help alleviate symptoms and has been taking as prescribed, but states that sore throat has not improved.  Patient is concerned for STD in the throat as she did perform oral sex recently.  Although, patient states that sore throat started prior to performing oral sex.  Denies any vaginal discharge, urinary burning, urinary frequency, pelvic pain, abdominal pain, fever, back pain.  Denies any known exposure to STD.   Sore Throat   Past Medical History:  Diagnosis Date   Anemia    Anxiety    Blood transfusion without reported diagnosis    Central centrifugal scarring alopecia    Chronic swimmer's ear of right side    Crohn's colitis ([place])    Hypertension    Phreesia 09/10/2020   IDA (iron deficiency anemia)    Impetigo    Postinflammatory hyperpigmentation    Scalp psoriasis    Seborrheic dermatitis of scalp     Patient Active Problem List   Diagnosis Date Noted   Anxiety disorder 04/03/2021   Bipolar disorder ([place]) 04/03/2021   Hemorrhoids 12/26/2020   Chest pain of uncertain etiology 78/93/8101   Essential hypertension 12/25/2020   History of pre-eclampsia 12/25/2020   ASCUS of cervix with negative high risk HPV 11/27/2020   Iron deficiency anemia 11/14/2020   Fecal urgency 10/16/2020   Nasal sore 10/16/2020   Crohn's disease of both small and large intestine with other complication ([place]) 75/08/2584   History of iron deficiency anemia 03/08/2020   Infliximab (Remicade) long-term use 03/08/2020   History of immunosuppression therapy  03/08/2020   Generalized abdominal pain 03/08/2020   Bloating symptom 03/08/2020   Chronic diarrhea 03/08/2020   Cyst of ovary 12/21/2016   Crohn's disease ([place]) 11/27/2015    Past Surgical History:  Procedure Laterality Date   APPENDECTOMY     BOWEL RESECTION     COLONOSCOPY     UPPER GASTROINTESTINAL ENDOSCOPY      OB History   No obstetric history on file.      Home Medications    Prior to Admission medications   Medication Sig Start Date End Date Taking? Authorizing Provider  cefdinir (OMNICEF) 300 MG capsule Take 1 capsule (300 mg total) by mouth 2 (two) times daily for 10 days. 07/23/21 08/02/21 Yes Odis Luster, FNP  benzonatate (TESSALON) 100 MG capsule Take 1-2 capsules (100-200 mg total) by mouth 3 (three) times daily as needed for cough. 07/21/21   Jaynee Eagles, PA-C  celecoxib (CELEBREX) 100 MG capsule Take 1 capsule (100 mg total) by mouth 2 (two) times daily. 04/03/21   [name], MD  cetirizine (ZYRTEC ALLERGY) 10 MG tablet Take 1 tablet (10 mg total) by mouth daily. 07/21/21   Jaynee Eagles, PA-C  inFLIXimab (REMICADE IV) Inject into the vein. Every 8 weeks    [provider]  Multiple Vitamin (MULTIVITAMIN ADULT PO) Take by mouth daily.    [provider]  Multiple Vitamins-Minerals (HAIR SKIN AND NAILS FORMULA) TABS Take 1 tablet by mouth daily.  [provider]  naproxen (NAPROSYN) 500 MG tablet Take 1 tablet (500 mg total) by mouth 2 (two) times daily. 04/23/21   Wieters, Hallie C, PA-C  promethazine-dextromethorphan (PROMETHAZINE-DM) 6.25-15 MG/5ML syrup Take 5 mLs by mouth at bedtime as needed for cough. 07/21/21   Jaynee Eagles, PA-C  pseudoephedrine (SUDAFED) 60 MG tablet Take 1 tablet (60 mg total) by mouth every 8 (eight) hours as needed for congestion. 07/21/21   Jaynee Eagles, PA-C    Family History Family History  Problem Relation Age of Onset   Crohn's disease Mother    Schizophrenia Father    Bipolar disorder  Father    Crohn's disease Maternal Grandmother    Prostate cancer Maternal Uncle    Colon cancer Neg Hx    Stomach cancer Neg Hx    Pancreatic cancer Neg Hx    Esophageal cancer Neg Hx    Inflammatory bowel disease Neg Hx    Liver disease Neg Hx    Rectal cancer Neg Hx     Social History Social History   Tobacco Use   Smoking status: Never   Smokeless tobacco: Never  Vaping Use   Vaping Use: Never used  Substance Use Topics   Alcohol use: Yes    Comment: socially.    Drug use: Not Currently    Types: Marijuana    Comment: last used 04-17-20 per pt     Allergies   Adalimumab and Aspirin   Review of Systems Review of Systems Per HPI  Physical Exam Triage Vital Signs ED Triage Vitals  Enc Vitals Group     BP 07/23/21 0829 120/83     Pulse Rate 07/23/21 0829 72     Resp 07/23/21 0829 20     Temp 07/23/21 0829 98.3 F (36.8 C)     Temp Source 07/23/21 0829 Oral     SpO2 07/23/21 0829 97 %     Weight --      Height --      Head Circumference --      Peak Flow --      Pain Score 07/23/21 0835 10     Pain Loc --      Pain Edu? --      Excl. in [place]? --    No data found.  Updated Vital Signs BP 120/83 (BP Location: Left Arm)   Pulse 72   Temp 98.3 F (36.8 C) (Oral)   Resp 20   LMP 07/08/2021   SpO2 97%   Visual Acuity Right Eye Distance:   Left Eye Distance:   Bilateral Distance:    Right Eye Near:   Left Eye Near:    Bilateral Near:     Physical Exam Constitutional:      General: She is not in acute distress.    Appearance: Normal appearance.  HENT:     Head: Normocephalic and atraumatic.     Right Ear: Tympanic membrane and ear canal normal.     Left Ear: Tympanic membrane and ear canal normal.     Nose: Congestion present.     Mouth/Throat:     Mouth: Mucous membranes are moist.     Pharynx: Posterior oropharyngeal erythema present.     Tonsils: 1+ on the right. 1+ on the left.  Eyes:     Extraocular Movements: Extraocular movements  intact.     Conjunctiva/sclera: Conjunctivae normal.     Pupils: Pupils are equal, round, and reactive to light.  Cardiovascular:  Rate and Rhythm: Normal rate and regular rhythm.     Pulses: Normal pulses.     Heart sounds: Normal heart sounds.  Pulmonary:     Effort: Pulmonary effort is normal. No respiratory distress.     Breath sounds: Normal breath sounds. No wheezing.  Abdominal:     General: Abdomen is flat. Bowel sounds are normal.     Palpations: Abdomen is soft.  Musculoskeletal:        General: Normal range of motion.     Cervical back: Normal range of motion.  Skin:    General: Skin is warm and dry.  Neurological:     General: No focal deficit present.     Mental Status: She is alert and oriented to person, place, and time. Mental status is at baseline.  Psychiatric:        Mood and Affect: Mood normal.        Behavior: Behavior normal.     UC Treatments / Results  Labs (all labs ordered are listed, but only abnormal results are displayed) Labs Reviewed  CYTOLOGY, (ORAL, ANAL, URETHRAL) ANCILLARY ONLY    EKG   Radiology No results found.  Procedures Procedures (including critical care time)  Medications Ordered in UC Medications - No data to display  Initial Impression / Assessment and Plan / UC Course  I have reviewed the triage vital signs and the nursing notes.  Pertinent labs & imaging results that were available during my care of the patient were reviewed by me and considered in my medical decision making (see chart for details).     Will treat with antibiotic due to persistent sore throat.  Patient states that she has been prescribed an antibiotic by her dermatologist to help with acne but is not sure the name of the antibiotic.  Will treat with cefdinir due to penicillin interaction with doxycycline in case doxycycline was prescribed.  Patient advised to avoid taking antibiotic for acne until cefdinir antibiotic is complete to avoid any  interactions if possible.  Patient voiced understanding.  STD oral swab pending due to patient request. Discussed strict return precautions. Patient verbalized understanding and is agreeable with plan.  Final Clinical Impressions(s) / UC Diagnoses   Final diagnoses:  Sore throat  Screening examination for venereal disease     Discharge Instructions      Your throat culture is still pending.  Your STD swab is also pending.  We will call you if either one of these are positive.  You have been prescribed an antibiotic to help treat sore throat. Please take this medication with food.      ED Prescriptions     Medication Sig Dispense Auth. Provider   cefdinir (OMNICEF) 300 MG capsule Take 1 capsule (300 mg total) by mouth 2 (two) times daily for 10 days. 20 capsule Odis Luster, FNP      PDMP not reviewed this encounter.   Odis Luster, [place] 07/23/21 [id number]

## 2021-07-24 LAB — CULTURE, GROUP A STREP (THRC)

## 2021-07-29 LAB — CYTOLOGY, (ORAL, ANAL, URETHRAL) ANCILLARY ONLY
Chlamydia: NEGATIVE
Comment: NEGATIVE
Comment: NEGATIVE
Comment: NORMAL
Neisseria Gonorrhea: NEGATIVE
Trichomonas: NEGATIVE

## 2021-08-04 NOTE — Progress Notes (Signed)
Patient ID: Erika Stephens, female    DOB: [birth year]  MRN: [id number]  CC: Sore Throat   Subjective: Erika Stephens is a 30 y.o. female who presents for sore throat.  Her concerns today include:   Visit 07/23/2021 at [facility] at [facility] per NP note: Will treat with antibiotic due to persistent sore throat.  Patient states that she has been prescribed an antibiotic by her dermatologist to help with acne but is not sure the name of the antibiotic.  Will treat with cefdinir due to penicillin interaction with doxycycline in case doxycycline was prescribed.  Patient advised to avoid taking antibiotic for acne until cefdinir antibiotic is complete to avoid any interactions if possible.  Patient voiced understanding.  STD oral swab pending due to patient request. Discussed strict return precautions. Patient verbalized understanding and is agreeable with plan.   08/05/2021: Reports sore throat and posterior tongue swelling with painful bumps occurring intermittently for 3 weeks. Itching near back of throat and itching in ears. Difficulty swallowing and talking sometimes. Endorses coughing up thick white phlegm. Denies shortness of breath and chest pain. Finished antibiotics prescribed from Urgent Care. Taking Celebrex helps. Has decreased smoking marijuana. Has an appointment scheduled at [facility] on 08/12/2021.   Depression screen [facility] 2/9 08/05/2021 11/20/2020 09/10/2020  Decreased Interest 0 1 0  Down, Depressed, Hopeless 0 1 0  PHQ - 2 Score 0 2 0  Altered sleeping - 2 -  Tired, decreased energy - 1 -  Change in appetite - 2 -  Feeling bad or failure about yourself  - 1 -  Trouble concentrating - 0 -  Moving slowly or fidgety/restless - 0 -  Suicidal thoughts - 0 -  PHQ-9 Score - 8 -  Difficult doing work/chores - Somewhat difficult -      Patient Active Problem List   Diagnosis Date Noted   Anxiety disorder 04/03/2021   Bipolar disorder ([place])  04/03/2021   Hemorrhoids 12/26/2020   Chest pain of uncertain etiology 28/41/3244   Essential hypertension 12/25/2020   History of pre-eclampsia 12/25/2020   ASCUS of cervix with negative high risk HPV 11/27/2020   Iron deficiency anemia 11/14/2020   Fecal urgency 10/16/2020   Nasal sore 10/16/2020   Crohn's disease of both small and large intestine with other complication ([place]) 11/11/7251   History of iron deficiency anemia 03/08/2020   Infliximab (Remicade) long-term use 03/08/2020   History of immunosuppression therapy 03/08/2020   Generalized abdominal pain 03/08/2020   Bloating symptom 03/08/2020   Chronic diarrhea 03/08/2020   Cyst of ovary 12/21/2016   Crohn's disease ([place]) 11/27/2015     Current Outpatient Medications on File Prior to Visit  Medication Sig Dispense Refill   benzonatate (TESSALON) 100 MG capsule Take 1-2 capsules (100-200 mg total) by mouth 3 (three) times daily as needed for cough. 60 capsule 0   cetirizine (ZYRTEC ALLERGY) 10 MG tablet Take 1 tablet (10 mg total) by mouth daily. 30 tablet 0   inFLIXimab (REMICADE IV) Inject into the vein. Every 8 weeks     Multiple Vitamin (MULTIVITAMIN ADULT PO) Take by mouth daily.     Multiple Vitamins-Minerals (HAIR SKIN AND NAILS FORMULA) TABS Take 1 tablet by mouth daily.     naproxen (NAPROSYN) 500 MG tablet Take 1 tablet (500 mg total) by mouth 2 (two) times daily. 30 tablet 0   promethazine-dextromethorphan (PROMETHAZINE-DM) 6.25-15 MG/5ML syrup Take 5 mLs by mouth at bedtime  as needed for cough. 100 mL 0   pseudoephedrine (SUDAFED) 60 MG tablet Take 1 tablet (60 mg total) by mouth every 8 (eight) hours as needed for congestion. 30 tablet 0   No current facility-administered medications on file prior to visit.    Allergies  Allergen Reactions   Adalimumab Swelling   Aspirin Hives    Social History   Socioeconomic History   Marital status: Single    Spouse name: Not on file   Number of children: Not on  file   Years of education: Not on file   Highest education level: Not on file  Occupational History   Not on file  Tobacco Use   Smoking status: Never   Smokeless tobacco: Never  Vaping Use   Vaping Use: Never used  Substance and Sexual Activity   Alcohol use: Yes    Comment: socially.    Drug use: Not Currently    Types: Marijuana    Comment: last used 04-17-20 per pt   Sexual activity: Not Currently    Birth control/protection: Implant  Other Topics Concern   Not on file  Social History Narrative   Not on file   Social Determinants of Health   Financial Resource Strain: Not on file  Food Insecurity: Not on file  Transportation Needs: Not on file  Physical Activity: Not on file  Stress: Not on file  Social Connections: Not on file  Intimate Partner Violence: Not on file    Family History  Problem Relation Age of Onset   Crohn's disease Mother    Schizophrenia Father    Bipolar disorder Father    Crohn's disease Maternal Grandmother    Prostate cancer Maternal Uncle    Colon cancer Neg Hx    Stomach cancer Neg Hx    Pancreatic cancer Neg Hx    Esophageal cancer Neg Hx    Inflammatory bowel disease Neg Hx    Liver disease Neg Hx    Rectal cancer Neg Hx     Past Surgical History:  Procedure Laterality Date   APPENDECTOMY     BOWEL RESECTION     COLONOSCOPY     UPPER GASTROINTESTINAL ENDOSCOPY      ROS: Review of Systems Negative except as stated above  PHYSICAL EXAM: BP 113/76   Pulse 80   Temp 98.3 F (36.8 C)   Resp 16   Ht 5' 0.98" (1.549 m)   Wt 155 lb 3.2 oz (70.4 kg)   LMP 07/08/2021   SpO2 96%   BMI 29.34 kg/m   Physical Exam HENT:     Head: Normocephalic and atraumatic.     Right Ear: Tympanic membrane, ear canal and external ear normal.     Left Ear: Tympanic membrane, ear canal and external ear normal.     Nose: Nose normal.     Mouth/Throat:     Mouth: Mucous membranes are moist.     Pharynx: Oropharynx is clear.     Comments:  Posterior tongue with several erythematous papular bumps, no evidence of drainage.  Eyes:     Extraocular Movements: Extraocular movements intact.     Conjunctiva/sclera: Conjunctivae normal.     Pupils: Pupils are equal, round, and reactive to light.  Cardiovascular:     Rate and Rhythm: Normal rate and regular rhythm.     Pulses: Normal pulses.     Heart sounds: Normal heart sounds.  Pulmonary:     Effort: Pulmonary effort is normal.  Breath sounds: Normal breath sounds.  Musculoskeletal:     Cervical back: Normal range of motion and neck supple.  Neurological:     General: No focal deficit present.     Mental Status: She is alert and oriented to person, place, and time.  Psychiatric:        Mood and Affect: Mood normal.        Behavior: Behavior normal.   ASSESSMENT AND PLAN: 1. Sore throat: 2. Tongue swelling: - Patient stable today in office without evidence of cardiopulmonary distress.  - Continue Celecoxib as prescribed.  - Begin Guaifenesin as prescribed. - TSH to check thyroid function.  - Chlamydia, Gonorrhea, Trichomonas oral cytology negative on 07/23/2021. - Group A Strep Culture negative on 07/21/2021. - Keep appointment scheduled with [facility] on 08/12/2021. - Follow-up with primary provider as scheduled.  - TSH - celecoxib (CELEBREX) 100 MG capsule; Take 1 capsule (100 mg total) by mouth 2 (two) times daily.  Dispense: 30 capsule; Refill: 0 - guaiFENesin 200 MG tablet; Take 1 tablet (200 mg total) by mouth every 6 (six) hours as needed for cough or to loosen phlegm.  Dispense: 30 tablet; Refill: 0    Patient was given the opportunity to ask questions.  Patient verbalized understanding of the plan and was able to repeat key elements of the plan. Patient was given clear instructions to go to Emergency Department or return to medical center if symptoms don't improve, worsen, or new problems develop.The patient verbalized  understanding.   Orders Placed This Encounter  Procedures   TSH     Requested Prescriptions   Signed Prescriptions Disp Refills   celecoxib (CELEBREX) 100 MG capsule 30 capsule 0    Sig: Take 1 capsule (100 mg total) by mouth 2 (two) times daily.   guaiFENesin 200 MG tablet 30 tablet 0    Sig: Take 1 tablet (200 mg total) by mouth every 6 (six) hours as needed for cough or to loosen phlegm.    Follow-up with primary provider as scheduled.   Camillia Herter, NP

## 2021-08-05 ENCOUNTER — Encounter: Payer: Self-pay | Admitting: Family

## 2021-08-05 ENCOUNTER — Ambulatory Visit: Payer: Medicaid Other | Admitting: Family

## 2021-08-05 ENCOUNTER — Other Ambulatory Visit: Payer: Self-pay

## 2021-08-05 VITALS — BP 113/76 | HR 80 | Temp 98.3°F | Resp 16 | Ht 60.98 in | Wt 155.2 lb

## 2021-08-05 DIAGNOSIS — R220 Localized swelling, mass and lump, head: Secondary | ICD-10-CM | POA: Diagnosis not present

## 2021-08-05 DIAGNOSIS — J029 Acute pharyngitis, unspecified: Secondary | ICD-10-CM | POA: Diagnosis not present

## 2021-08-05 MED ORDER — GUAIFENESIN 200 MG PO TABS
200.0000 mg | ORAL_TABLET | Freq: Four times a day (QID) | ORAL | 0 refills | Status: DC | PRN
Start: 2021-08-05 — End: 2022-05-18

## 2021-08-05 MED ORDER — CELECOXIB 100 MG PO CAPS: 100.0000 mg | ORAL_CAPSULE | Freq: Two times a day (BID) | ORAL | 0 refills | Status: DC

## 2021-08-05 NOTE — Progress Notes (Signed)
Pt presents for swollen tongue and sore throat, pt reports pain

## 2021-08-06 LAB — TSH: TSH: 0.916 u[IU]/mL (ref 0.450–4.500)

## 2021-08-06 NOTE — Progress Notes (Signed)
Thyroid function normal.

## 2021-09-09 ENCOUNTER — Telehealth: Payer: Self-pay | Admitting: Internal Medicine

## 2021-09-09 ENCOUNTER — Encounter: Payer: Self-pay | Admitting: *Deleted

## 2021-09-09 DIAGNOSIS — R072 Precordial pain: Secondary | ICD-10-CM

## 2021-09-09 MED ORDER — METOPROLOL TARTRATE 100 MG PO TABS: 100.0000 mg | ORAL_TABLET | Freq: Once | ORAL | 0 refills | Status: DC | PRN

## 2021-09-09 NOTE — Telephone Encounter (Signed)
Patient is calling in about continued chest pain since last OV. Visit in Feb 2022 with Dr. Gasper Sells. Note from OV:   Patient notes that she is feeling chest pain.  Since March 2020 (post partum).  Patient notes that she has been having left sided chest pain. Chest pain goes town her arm.  Feel like a strong pain.  Discomfort occurs with driving or spontaneous.  The pain seems to be the same as described previously including left arm pain as well. She had an exercise stress test on 05/01/21 which showed No evidence of ischemia with PRN follow up.   Visit with PCP 04/03/21: Erika Stephens is here for follow up for chest wall pain. She went to the Urgent Care on 5/24. Her EKG was normal. Pain was reproducible on exam so suspected to be msk in nature. She was given Toradol injection. Also given prescription for Celebrex. She reports she is still having that chest pressure.   Patient would like further evaluation. Next available appointment 11/17/20. In the mean time patient would like to know what see can do to help. If she needs to do any testing before appointment or if she needs to come in sooner.  Will forward to MD for advisement.

## 2021-09-09 NOTE — Telephone Encounter (Signed)
Pt c/o of Chest Pain: STAT if CP now or developed within 24 hours  1. Are you having CP right now? no  2. Are you experiencing any other symptoms (ex. SOB, nausea, vomiting, sweating)? No just aching in left arm when having chest pain... feels like a pinched nerve   3. How long have you been experiencing CP? Since last visit   4. Is your CP continuous or coming and going? Continuous   5. Have you taken Nitroglycerin? No    pt is having severe heart and chest pain.. felt like when she was driving she was going to have a heart attack... pt says that an ekg was done but feels like additional research is needed.. please advise.   ?

## 2021-09-09 NOTE — Telephone Encounter (Signed)
I worry that her persistent chest pain that radiates down her arm since 01/2019 (intermittent) with a negative stress test in a 30 yo F may represents non cardiac chest pain.  It sounds like her PCP has left our institution, but she should follow up.   We can offer a cardiac CT: the risk is exposure to radiation and IV contrast with a low clinical suspicion of heart disease.  The main reason to do this test would be so that her non-cardiology providers feel comfortable that we have well excluded heart disease.   A/P  - I am willing to order a Cardiac CT for this patient, though I think this she has increased radiation risk for her and I do not suspect cardiac disease, if she would like to proceed with the test the diagnosis code would be pre-cordial CP.  If that is the case, we would see her after the test was done.  - if she would like to see me or an APP in follow up to discuss options, happy to schedule her in that January first available slot  - if she has a new clinical change she should see DOD or ED as appropriate.   Thanks,  MAC   Patient would like to move forward with Cardiac CT and follow up post test. Ordered and instructions sent over mychart. Advised to call back with instruction questions if needed.   Verbalized understanding.

## 2021-09-10 ENCOUNTER — Encounter (HOSPITAL_COMMUNITY): Payer: Self-pay

## 2021-09-13 ENCOUNTER — Other Ambulatory Visit: Payer: Self-pay

## 2021-09-13 ENCOUNTER — Encounter: Payer: Self-pay | Admitting: Emergency Medicine

## 2021-09-13 ENCOUNTER — Ambulatory Visit
Admission: EM | Admit: 2021-09-13 | Discharge: 2021-09-13 | Disposition: A | Discharge status: Home / Self Care | Payer: Medicaid Other | Attending: Internal Medicine | Admitting: Internal Medicine

## 2021-09-13 DIAGNOSIS — N898 Other specified noninflammatory disorders of vagina: Secondary | ICD-10-CM

## 2021-09-13 DIAGNOSIS — R350 Frequency of micturition: Secondary | ICD-10-CM | POA: Diagnosis not present

## 2021-09-13 DIAGNOSIS — N3001 Acute cystitis with hematuria: Secondary | ICD-10-CM

## 2021-09-13 DIAGNOSIS — Z113 Encounter for screening for infections with a predominantly sexual mode of transmission: Secondary | ICD-10-CM | POA: Diagnosis not present

## 2021-09-13 LAB — POCT URINALYSIS DIP (MANUAL ENTRY)
Bilirubin, UA: NEGATIVE
Glucose, UA: NEGATIVE mg/dL
Ketones, POC UA: NEGATIVE mg/dL
Nitrite, UA: NEGATIVE
Protein Ur, POC: NEGATIVE mg/dL
Spec Grav, UA: 1.025 (ref 1.010–1.025)
Urobilinogen, UA: 0.2 E.U./dL
pH, UA: 7 (ref 5.0–8.0)

## 2021-09-13 LAB — POCT URINE PREGNANCY: Preg Test, Ur: NEGATIVE

## 2021-09-13 MED ORDER — NITROFURANTOIN MONOHYD MACRO 100 MG PO CAPS: 100.0000 mg | ORAL_CAPSULE | Freq: Two times a day (BID) | ORAL | 0 refills | Status: DC

## 2021-09-13 NOTE — ED Provider Notes (Signed)
[name]    [name]: [id number] Arrival date & time: 09/13/21  0835      History   Chief Complaint Chief Complaint  Patient presents with   Urinary Frequency    HPI Erika Stephens is a 30 y.o. female.   Patient presents with urinary urgency, urinary frequency, urinary burning that has been intermittent for approximately 1 month.  Patient reports that she has now developed a yellow-ish vaginal discharge that started approximately 1 week ago that also has an associated odor.  Patient is requesting STD testing but denies any known exposure to STD.  Patient had unprotected sexual intercourse approximately 2 months prior.  Denies any hematuria, pelvic pain, fever but does endorse some slight lower abdominal pain and intermittent back pain.  Last menstrual cycle was approximately 1 month ago.   Urinary Frequency   Past Medical History:  Diagnosis Date   Anemia    Anxiety    Blood transfusion without reported diagnosis    Central centrifugal scarring alopecia    Chronic swimmer's ear of right side    Crohn's colitis ([place])    Hypertension    [employer]rees[employer] 09/10/2020   IDA (iron deficiency anemia)    Impetigo    Postinflammatory hyperpigmentation    Scalp psoriasis    Seborrheic dermatitis of scalp     Patient Active Problem List   Diagnosis Date Noted   Anxiety disorder 04/03/2021   Bipolar disorder ([place]) 04/03/2021   Hemorrhoids 12/26/2020   Chest pain of uncertain etiology 00/86/7619   Essential hypertension 12/25/2020   History of pre-eclampsia 12/25/2020   ASCUS of cervix with negative high risk HPV 11/27/2020   Iron deficiency anemia 11/14/2020   Fecal urgency 10/16/2020   Nasal sore 10/16/2020   Crohn's disease of both small and large intestine with other complication ([place]) 50/93/2671   History of iron deficiency anemia 03/08/2020   Infliximab (Remicade) long-term use 03/08/2020   History of immunosuppression therapy 03/08/2020   Generalized abdominal pain  03/08/2020   Bloating symptom 03/08/2020   Chronic diarrhea 03/08/2020   Cyst of ovary 12/21/2016   Crohn's disease ([place]) 11/27/2015    Past Surgical History:  Procedure Laterality Date   APPENDECTOMY     BOWEL RESECTION     COLONOSCOPY     UPPER GASTROINTESTINAL ENDOSCOPY      OB History   No obstetric history on file.      Home Medications    Prior to Admission medications   Medication Sig Start Date End Date Taking? Authorizing Provider  celecoxib (CELEBREX) 100 MG capsule Take 1 capsule (100 mg total) by mouth 2 (two) times daily. 08/05/21  Yes Minette Brine, Amy J, NP  inFLIXimab (REMICADE IV) Inject into the vein. Every 8 weeks   Yes [provider]  metoprolol tartrate (LOPRESSOR) 100 MG tablet Take 1 tablet (100 mg total) by mouth Once PRN (take two hours before your Cardiac CT). 09/09/21  Yes Chandrasekhar, Mahesh A, MD  nitrofurantoin, macrocrystal-monohydrate, (MACROBID) 100 MG capsule Take 1 capsule (100 mg total) by mouth 2 (two) times daily. 09/13/21  Yes , Hildred Alamin E, FNP  benzonatate (TESSALON) 100 MG capsule Take 1-2 capsules (100-200 mg total) by mouth 3 (three) times daily as needed for cough. 07/21/21   Jaynee Eagles, PA-C  cetirizine (ZYRTEC ALLERGY) 10 MG tablet Take 1 tablet (10 mg total) by mouth daily. 07/21/21   Jaynee Eagles, PA-C  guaiFENesin 200 MG tablet Take 1 tablet (200 mg total) by mouth every 6 (six)  hours as needed for cough or to loosen phlegm. 08/05/21   Camillia Herter, NP  Multiple Vitamin (MULTIVITAMIN ADULT PO) Take by mouth daily.    [provider]  Multiple Vitamins-Minerals (HAIR SKIN AND NAILS FORMULA) TABS Take 1 tablet by mouth daily.    [provider]  naproxen (NAPROSYN) 500 MG tablet Take 1 tablet (500 mg total) by mouth 2 (two) times daily. 04/23/21   Wieters, Hallie C, PA-C  promethazine-dextromethorphan (PROMETHAZINE-DM) 6.25-15 MG/5ML syrup Take 5 mLs by mouth at bedtime as needed for cough. 07/21/21   Jaynee Eagles, PA-C  pseudoephedrine (SUDAFED) 60 MG tablet Take 1 tablet (60 mg total) by mouth every 8 (eight) hours as needed for congestion. 07/21/21   Jaynee Eagles, PA-C    Family History Family History  Problem Relation Age of Onset   Crohn's disease Mother    Schizophrenia Father    Bipolar disorder Father    Crohn's disease Maternal Grandmother    Prostate cancer Maternal Uncle    Colon cancer Neg Hx    Stomach cancer Neg Hx    Pancreatic cancer Neg Hx    Esophageal cancer Neg Hx    Inflammatory bowel disease Neg Hx    Liver disease Neg Hx    Rectal cancer Neg Hx     Social History Social History   Tobacco Use   Smoking status: Never   Smokeless tobacco: Never  Vaping Use   Vaping Use: Never used  Substance Use Topics   Alcohol use: Yes    Comment: socially.    Drug use: Not Currently    Types: Marijuana    Comment: last used 04-17-20 per pt     Allergies   Adalimumab and Aspirin   Review of Systems Review of Systems Per HPI  Physical Exam Triage Vital Signs ED Triage Vitals  Enc Vitals Group     BP 09/13/21 0921 112/64     Pulse Rate 09/13/21 0921 69     Resp 09/13/21 0921 18     Temp 09/13/21 0921 98.2 F (36.8 C)     Temp Source 09/13/21 0921 Oral     SpO2 09/13/21 0921 98 %     Weight 09/13/21 0922 156 lb (70.8 kg)     Height 09/13/21 0922 5' 2"  (1.575 m)     Head Circumference --      Peak Flow --      Pain Score 09/13/21 0922 0     Pain Loc --      Pain Edu? --      Excl. in [place]? --    No data found.  Updated Vital Signs BP 112/64 (BP Location: Right Arm)   Pulse 69   Temp 98.2 F (36.8 C) (Oral)   Resp 18   Ht 5' 2"  (1.575 m)   Wt 156 lb (70.8 kg)   LMP 08/17/2021   SpO2 98%   BMI 28.53 kg/m   Visual Acuity Right Eye Distance:   Left Eye Distance:   Bilateral Distance:    Right Eye Near:   Left Eye Near:    Bilateral Near:     Physical Exam Constitutional:      General: She is not in acute distress.    Appearance: Normal  appearance. She is not toxic-appearing or diaphoretic.  HENT:     Head: Normocephalic and atraumatic.  Eyes:     Extraocular Movements: Extraocular movements intact.     Conjunctiva/sclera: Conjunctivae normal.  Cardiovascular:  Rate and Rhythm: Normal rate and regular rhythm.     Pulses: Normal pulses.     Heart sounds: Normal heart sounds.  Pulmonary:     Effort: Pulmonary effort is normal. No respiratory distress.     Breath sounds: Normal breath sounds.  Abdominal:     General: Abdomen is flat. Bowel sounds are normal. There is no distension.     Palpations: Abdomen is soft.     Tenderness: There is no abdominal tenderness.  Neurological:     General: No focal deficit present.     Mental Status: She is alert and oriented to person, place, and time. Mental status is at baseline.  Psychiatric:        Mood and Affect: Mood normal.        Behavior: Behavior normal.        Thought Content: Thought content normal.        Judgment: Judgment normal.     UC Treatments / Results  Labs (all labs ordered are listed, but only abnormal results are displayed) Labs Reviewed  POCT URINALYSIS DIP (MANUAL ENTRY) - Abnormal; Notable for the following components:      Result Value   Clarity, UA hazy (*)    Blood, UA large (*)    Leukocytes, UA Small (1+) (*)    All other components within normal limits  URINE CULTURE  POCT URINE PREGNANCY  CERVICOVAGINAL ANCILLARY ONLY    EKG   Radiology No results found.  Procedures Procedures (including critical care time)  Medications Ordered in UC Medications - No data to display  Initial Impression / Assessment and Plan / UC Course  I have reviewed the triage vital signs and the nursing notes.  Pertinent labs & imaging results that were available during my care of the patient were reviewed by me and considered in my medical decision making (see chart for details).     Urinalysis showing leukocytes that is indicative of urinary  tract infection.  Urine culture is pending.  Cervicovaginal swab pending.  Urine pregnancy was negative.  Will treat with Macrobid x5 days.  Patient to refrain from sexual activity until test results and treatment are complete.  Patient to increase clear oral fluid intake.  No red flags seen on exam.  Discussed return precautions.  Patient verbalized understanding and was agreeable with plan. Final Clinical Impressions(s) / UC Diagnoses   Final diagnoses:  Urinary frequency  Acute cystitis with hematuria  Vaginal discharge  Screening examination for venereal disease     Discharge Instructions      Your urine is showing signs of urinary tract infection.  You are being treated with Macrobid antibiotic.  Please increase clear oral fluid intake.  Refrain from sexual activity until test results and treatment are complete.  Vaginal swab is pending.  Urine culture is pending.  We will call if these are positive.  Urine pregnancy was negative.     ED Prescriptions     Medication Sig Dispense Auth. Provider   nitrofurantoin, macrocrystal-monohydrate, (MACROBID) 100 MG capsule Take 1 capsule (100 mg total) by mouth 2 (two) times daily. 10 capsule Teodora Medici, [place]      PDMP not reviewed this encounter.   Teodora Medici, [place] 09/13/21 1010

## 2021-09-13 NOTE — Discharge Instructions (Signed)
Your urine is showing signs of urinary tract infection.  You are being treated with Macrobid antibiotic.  Please increase clear oral fluid intake.  Refrain from sexual activity until test results and treatment are complete.  Vaginal swab is pending.  Urine culture is pending.  We will call if these are positive.  Urine pregnancy was negative.

## 2021-09-13 NOTE — ED Triage Notes (Signed)
Patient c/o urinary urgency, frequency, dysuria off and on for a month.  Now patient is c/o vaginal odor, slight discharge this week.  Concern for STI.

## 2021-09-14 LAB — URINE CULTURE: Culture: <10000 — AB

## 2021-09-15 LAB — CERVICOVAGINAL ANCILLARY ONLY
Bacterial Vaginitis (gardnerella): POSITIVE — AB
Candida Glabrata: NEGATIVE
Candida Vaginitis: NEGATIVE
Chlamydia: NEGATIVE
Comment: NEGATIVE
Comment: NEGATIVE
Comment: NEGATIVE
Comment: NEGATIVE
Comment: NEGATIVE
Comment: NORMAL
Neisseria Gonorrhea: NEGATIVE
Trichomonas: NEGATIVE

## 2021-09-16 ENCOUNTER — Telehealth (HOSPITAL_COMMUNITY): Payer: Self-pay | Admitting: Emergency Medicine

## 2021-09-16 MED ORDER — METRONIDAZOLE 500 MG PO TABS: 500.0000 mg | ORAL_TABLET | Freq: Two times a day (BID) | ORAL | 0 refills | Status: DC

## 2021-09-19 ENCOUNTER — Other Ambulatory Visit: Payer: Self-pay

## 2021-09-19 ENCOUNTER — Ambulatory Visit (HOSPITAL_COMMUNITY): Payer: Medicaid Other

## 2021-09-19 ENCOUNTER — Ambulatory Visit: Payer: Medicaid Other | Admitting: Family

## 2021-09-19 ENCOUNTER — Non-Acute Institutional Stay (HOSPITAL_COMMUNITY)
Admission: RE | Admit: 2021-09-19 | Discharge: 2021-09-19 | Disposition: A | Discharge status: Home / Self Care | Payer: Medicaid Other | Source: Ambulatory Visit | Attending: Internal Medicine | Admitting: Internal Medicine

## 2021-09-19 DIAGNOSIS — K5090 Crohn's disease, unspecified, without complications: Secondary | ICD-10-CM | POA: Insufficient documentation

## 2021-09-19 MED ORDER — ACETAMINOPHEN 325 MG PO TABS
650.0000 mg | ORAL_TABLET | Freq: Once | ORAL | Status: AC
  Administered 2021-09-19: 650 mg via ORAL
  Filled 2021-09-19: qty 2

## 2021-09-19 MED ORDER — DIPHENHYDRAMINE HCL 25 MG PO CAPS
25.0000 mg | ORAL_CAPSULE | Freq: Once | ORAL | Status: AC
  Administered 2021-09-19: 50 mg via ORAL
  Filled 2021-09-19: qty 2

## 2021-09-19 MED ORDER — SODIUM CHLORIDE 0.9 % IV SOLN: INTRAVENOUS | Status: DC | PRN

## 2021-09-19 MED ORDER — SODIUM CHLORIDE 0.9 % IV SOLN
700.0000 mg | INTRAVENOUS | Status: DC
  Administered 2021-09-19: 700 mg via INTRAVENOUS
  Filled 2021-09-19: qty 70

## 2021-09-19 NOTE — Progress Notes (Signed)
PATIENT CARE CENTER NOTE    Diagnosis: Crohn's disease     Provider:  Justice Britain MD     Procedure: Remicade infusion 793m     Note: Patient received Remicade (dose 4 of 6) infusion via PIV. Pt premedicated with 650 mg Tylenol and 50 mg Benadryl PO as ordered. Infusion titrated per protocol. Patient tolerated well with no adverse reaction. Vital signs stable.Pt declined AVS, declined to stay for 1 hour observation. Patient to come back every 8 weeks for infusion. Alert, oriented and ambulatory at discharge.

## 2021-09-22 ENCOUNTER — Other Ambulatory Visit: Payer: Self-pay

## 2021-09-22 ENCOUNTER — Telehealth (HOSPITAL_COMMUNITY): Payer: Self-pay | Admitting: Emergency Medicine

## 2021-09-22 ENCOUNTER — Ambulatory Visit: Payer: Medicaid Other | Admitting: Family Medicine

## 2021-09-22 NOTE — Telephone Encounter (Signed)
Reaching out to patient to offer assistance regarding upcoming cardiac imaging study; pt verbalizes understanding of appt date/time, parking situation and where to check in, pre-test NPO status and medications ordered, and verified current allergies; name and call back number provided for further questions should they arise Marchia Bond RN Navigator Cardiac Imaging [facility] [phone] office [phone] cell  R arm only 175m metoprolol tart

## 2021-09-23 ENCOUNTER — Ambulatory Visit (HOSPITAL_COMMUNITY)
Admission: RE | Admit: 2021-09-23 | Discharge: 2021-09-23 | Disposition: A | Discharge status: Home / Self Care | Payer: Medicaid Other | Source: Ambulatory Visit | Attending: Internal Medicine | Admitting: Internal Medicine

## 2021-09-26 ENCOUNTER — Ambulatory Visit: Payer: Medicaid Other | Admitting: Family Medicine

## 2021-09-29 ENCOUNTER — Telehealth (HOSPITAL_COMMUNITY): Payer: Self-pay | Admitting: *Deleted

## 2021-09-29 NOTE — Telephone Encounter (Signed)
Attempted to call patient regarding upcoming cardiac CT appointment. °Left message on voicemail with name and callback number ° °Janard Culp RN Navigator Cardiac Imaging °[facility] °[phone] Office °[phone] Cell ° °

## 2021-09-30 ENCOUNTER — Ambulatory Visit (HOSPITAL_COMMUNITY): Admission: RE | Admit: 2021-09-30 | Payer: Medicaid Other | Source: Ambulatory Visit

## 2021-10-24 ENCOUNTER — Telehealth (HOSPITAL_COMMUNITY): Payer: Self-pay | Admitting: *Deleted

## 2021-10-24 DIAGNOSIS — Z1152 Encounter for screening for COVID-19: Secondary | ICD-10-CM | POA: Diagnosis not present

## 2021-10-24 NOTE — Telephone Encounter (Signed)
Reaching out to patient to offer assistance regarding upcoming cardiac imaging study; pt verbalizes understanding of appt date/time, parking situation and where to check in, pre-test NPO status and medications ordered, and verified current allergies; name and call back number provided for further questions should they arise  Erika Clement RN Navigator Cardiac [place] [facility] [phone] office [phone] cell  Patient to take 158m metoprolol tartrate two hours prior to cardiac CT scan. She is aware to arrive at 2:45pm for her 3:15pm scan.

## 2021-10-27 ENCOUNTER — Other Ambulatory Visit: Payer: Self-pay

## 2021-10-27 ENCOUNTER — Ambulatory Visit (HOSPITAL_COMMUNITY)
Admission: RE | Admit: 2021-10-27 | Discharge: 2021-10-27 | Disposition: A | Discharge status: Home / Self Care | Payer: Medicaid Other | Source: Ambulatory Visit | Attending: Internal Medicine | Admitting: Internal Medicine

## 2021-10-27 DIAGNOSIS — R072 Precordial pain: Secondary | ICD-10-CM | POA: Diagnosis not present

## 2021-10-27 MED ORDER — NITROGLYCERIN 0.4 MG SL SUBL: 0.8000 mg | SUBLINGUAL_TABLET | Freq: Once | SUBLINGUAL | Status: DC

## 2021-10-27 MED ORDER — IOHEXOL 350 MG/ML SOLN
95.0000 mL | Freq: Once | INTRAVENOUS | Status: AC | PRN
  Administered 2021-10-27: 17:00:00 95 mL via INTRAVENOUS

## 2021-10-27 MED ORDER — SODIUM CHLORIDE 0.9 % IV SOLN: Freq: Once | INTRAVENOUS | Status: DC

## 2021-10-27 MED ORDER — NITROGLYCERIN 0.4 MG SL SUBL
SUBLINGUAL_TABLET | SUBLINGUAL | Status: AC
  Administered 2021-10-27: 16:00:00 0.4 mg via SUBLINGUAL
  Filled 2021-10-27: qty 1

## 2021-10-27 MED ORDER — SODIUM CHLORIDE 0.9 % IV BOLUS
500.0000 mL | Freq: Once | INTRAVENOUS | Status: AC
  Administered 2021-10-27: 15:00:00 500 mL via INTRAVENOUS

## 2021-10-27 MED ORDER — IOHEXOL 350 MG/ML SOLN
80.0000 mL | Freq: Once | INTRAVENOUS | Status: AC | PRN
  Administered 2021-10-27: 17:00:00 80 mL via INTRAVENOUS

## 2021-10-27 MED ORDER — NITROGLYCERIN 0.4 MG SL SUBL: 0.4000 mg | SUBLINGUAL_TABLET | Freq: Once | SUBLINGUAL | Status: AC

## 2021-10-30 DIAGNOSIS — Z1152 Encounter for screening for COVID-19: Secondary | ICD-10-CM | POA: Diagnosis not present

## 2021-11-08 DIAGNOSIS — Z1152 Encounter for screening for COVID-19: Secondary | ICD-10-CM | POA: Diagnosis not present

## 2021-11-16 NOTE — Progress Notes (Deleted)
Cardiology Office Note:    Date:  11/16/2021   ID:  Erika Stephens, DOB [birth year], MRN [id number]  PCP:  [name], MD   [place]  Cardiologist:  None  Advanced Practice Provider:  No care team member to display Electrophysiologist:  None      CC: chest pain follow up  History of Present Illness:    Erika Stephens is a 31 y.o. female with a hx of pre-eclampsia with third child, IDA who presents for evaluation 12/25/20.  In interval has had negative stress tests and Cardiac CT.  Seen 11/17/21.  Patient notes that she is feeling chest pain.  Since March 2020 (post partum).  Patient notes that she has been having left sided chest pain. Chest pain goes town her arm.  Feel like a strong pain.  Discomfort occurs with driving or spontaneous,  and improves with deep breathing and her old anti-hypertensive.  Unclear when her BP is with it.  Patient exertion notable taking care of the kids and feels no symptoms unless they are acting up.  No shortness of breath, DOE .  No PND or orthopnea.  No bendopnea, weight gain, leg swelling , or abdominal swelling.  No syncope or near syncope . Notes  no palpitations or funny heart beats.     Notes marijuana use doesn't make her chest pain any different.  Patient brings her youngest son today (60 years old).  Past Medical History:  Diagnosis Date   Anemia    Anxiety    Blood transfusion without reported diagnosis    Central centrifugal scarring alopecia    Chronic swimmer's ear of right side    Crohn's colitis ([place])    Hypertension    [employer] 09/10/2020   IDA (iron deficiency anemia)    Impetigo    Postinflammatory hyperpigmentation    Scalp psoriasis    Seborrheic dermatitis of scalp     Past Surgical History:  Procedure Laterality Date   APPENDECTOMY     BOWEL RESECTION     COLONOSCOPY     UPPER GASTROINTESTINAL ENDOSCOPY      Current Medications: No outpatient medications have been marked as taking  for the 11/17/21 encounter (Appointment) with Werner Lean, MD.     Allergies:   Adalimumab and Aspirin   Social History   Socioeconomic History   Marital status: Single    Spouse name: Not on file   Number of children: Not on file   Years of education: Not on file   Highest education level: Not on file  Occupational History   Not on file  Tobacco Use   Smoking status: Never   Smokeless tobacco: Never  Vaping Use   Vaping Use: Never used  Substance and Sexual Activity   Alcohol use: Yes    Comment: socially.    Drug use: Not Currently    Types: Marijuana    Comment: last used 04-17-20 per pt   Sexual activity: Not Currently    Birth control/protection: Implant  Other Topics Concern   Not on file  Social History Narrative   Not on file   Social Determinants of Health   Financial Resource Strain: Not on file  Food Insecurity: Not on file  Transportation Needs: Not on file  Physical Activity: Not on file  Stress: Not on file  Social Connections: Not on file    Social:  Has three children  Family History: The patient's family history includes Bipolar disorder in her  father; Crohn's disease in her maternal grandmother and mother; Prostate cancer in her maternal uncle; Schizophrenia in her father. There is no history of Colon cancer, Stomach cancer, Pancreatic cancer, Esophageal cancer, Inflammatory bowel disease, Liver disease, or Rectal cancer.  ROS:   Please see the history of present illness.     All other systems reviewed and are negative.  EKGs/Labs/Other Studies Reviewed:    The following studies were reviewed today:  EKG:   12/25/20: SR 68 WNL  Recent Labs: 11/20/2020: ALT 11; BUN 13; Creatinine, Ser 0.89; Potassium 4.0; Sodium 139 04/03/2021: Hemoglobin 13.2; Platelets 303 08/05/2021: TSH 0.916  Recent Lipid Panel    Component Value Date/Time   CHOL 160 11/20/2020 1012   TRIG 102 11/20/2020 1012   HDL 52 11/20/2020 1012   CHOLHDL 3.1  11/20/2020 1012   LDLCALC 89 11/20/2020 1012    Physical Exam:    VS:  There were no vitals taken for this visit.    Wt Readings from Last 3 Encounters:  09/13/21 70.8 kg  08/05/21 70.4 kg  05/07/21 71.2 kg    Gen: *** distress, *** obese/well nourished/malnourished   Neck: No JVD, *** carotid bruit Ears: Pilar Plate Sign Cardiac: No Rubs or Gallops, *** Murmur, ***cardia, *** radial pulses Respiratory: Clear to auscultation bilaterally, *** effort, ***  respiratory rate GI: Soft, nontender, non-distended *** MS: No *** edema; *** moves all extremities Integument: Skin feels *** Neuro:  At time of evaluation, alert and oriented to person/place/time/situation *** Psych: Normal affect, patient feels ***   ASSESSMENT:    No diagnosis found.  PLAN:    In order of problems listed above:  Non cardiac chest pain History of pre-eclampsia Hypertension -   PRN follow up    Medication Adjustments/Labs and Tests Ordered: Current medicines are reviewed at length with the patient today.  Concerns regarding medicines are outlined above.  No orders of the defined types were placed in this encounter.  No orders of the defined types were placed in this encounter.   There are no Patient Instructions on file for this visit.   Signed, Werner Lean, MD  11/16/2021 2:28 PM    [place]

## 2021-11-17 ENCOUNTER — Encounter (HOSPITAL_COMMUNITY): Payer: Medicaid Other

## 2021-11-17 ENCOUNTER — Ambulatory Visit: Payer: Medicaid Other | Admitting: Internal Medicine

## 2021-11-20 ENCOUNTER — Other Ambulatory Visit: Payer: Self-pay

## 2021-11-20 ENCOUNTER — Non-Acute Institutional Stay (HOSPITAL_COMMUNITY)
Admission: RE | Admit: 2021-11-20 | Discharge: 2021-11-20 | Disposition: A | Discharge status: Home / Self Care | Payer: Medicaid Other | Source: Ambulatory Visit | Attending: Internal Medicine | Admitting: Internal Medicine

## 2021-11-20 DIAGNOSIS — K5080 Crohn's disease of both small and large intestine without complications: Secondary | ICD-10-CM | POA: Insufficient documentation

## 2021-11-20 MED ORDER — DIPHENHYDRAMINE HCL 25 MG PO CAPS
25.0000 mg | ORAL_CAPSULE | Freq: Once | ORAL | Status: AC
  Administered 2021-11-20: 50 mg via ORAL
  Filled 2021-11-20: qty 2

## 2021-11-20 MED ORDER — SODIUM CHLORIDE 0.9 % IV SOLN
700.0000 mg | INTRAVENOUS | Status: DC
  Administered 2021-11-20: 700 mg via INTRAVENOUS
  Filled 2021-11-20: qty 70

## 2021-11-20 MED ORDER — SODIUM CHLORIDE 0.9 % IV SOLN: INTRAVENOUS | Status: DC | PRN

## 2021-11-20 MED ORDER — ACETAMINOPHEN 325 MG PO TABS
650.0000 mg | ORAL_TABLET | Freq: Once | ORAL | Status: AC
  Administered 2021-11-20: 650 mg via ORAL
  Filled 2021-11-20: qty 2

## 2021-11-20 NOTE — Progress Notes (Signed)
PATIENT CARE CENTER NOTE     Diagnosis: Crohn's disease     Provider:  Justice Britain MD     Procedure: Remicade infusion 731m     Note: Patient received Remicade infusion (dose 5 of 6) via PIV. Patient premedicated with 650 mg Tylenol and 50 mg Benadryl PO as ordered. Infusion titrated per protocol. Patient tolerated well with no adverse reaction. Vital signs stable.Pt declined AVS, declined to stay for 1 hour observation. Patient to come back every 8 weeks for infusion. Alert, oriented and ambulatory at discharge.

## 2021-11-23 DIAGNOSIS — Z1152 Encounter for screening for COVID-19: Secondary | ICD-10-CM | POA: Diagnosis not present

## 2022-01-01 ENCOUNTER — Other Ambulatory Visit: Payer: Self-pay

## 2022-01-01 ENCOUNTER — Non-Acute Institutional Stay (HOSPITAL_COMMUNITY)
Admission: RE | Admit: 2022-01-01 | Discharge: 2022-01-01 | Disposition: A | Discharge status: Home / Self Care | Payer: Medicaid Other | Source: Ambulatory Visit | Attending: Internal Medicine | Admitting: Internal Medicine

## 2022-01-01 DIAGNOSIS — K5090 Crohn's disease, unspecified, without complications: Secondary | ICD-10-CM | POA: Diagnosis present

## 2022-01-01 MED ORDER — DIPHENHYDRAMINE HCL 25 MG PO CAPS
25.0000 mg | ORAL_CAPSULE | Freq: Once | ORAL | Status: AC
  Administered 2022-01-01: 50 mg via ORAL
  Filled 2022-01-01: qty 2

## 2022-01-01 MED ORDER — ACETAMINOPHEN 325 MG PO TABS
650.0000 mg | ORAL_TABLET | Freq: Once | ORAL | Status: AC
  Administered 2022-01-01: 650 mg via ORAL
  Filled 2022-01-01: qty 2

## 2022-01-01 MED ORDER — SODIUM CHLORIDE 0.9 % IV SOLN
10.0000 mg/kg | INTRAVENOUS | Status: DC
  Administered 2022-01-01: 700 mg via INTRAVENOUS
  Filled 2022-01-01: qty 70

## 2022-01-01 MED ORDER — SODIUM CHLORIDE 0.9 % IV SOLN: INTRAVENOUS | Status: DC | PRN

## 2022-01-01 NOTE — Progress Notes (Signed)
PATIENT CARE CENTER NOTE     Diagnosis: Crohn's disease     Provider:  Justice Britain MD     Procedure: Remicade infusion 752m     Note: Patient received Remicade infusion (dose 6 of 6) via PIV. Patient premedicated with 650 mg Tylenol and 50 mg Benadryl PO as ordered. Infusion titrated per protocol. Patient tolerated well with no adverse reaction. Vital signs stable.Pt declined AVS and declined to stay for 1 hour observation. Patient to come back every 8 weeks for next infusion. Alert, oriented and ambulatory at discharge.

## 2022-01-02 ENCOUNTER — Other Ambulatory Visit: Payer: Self-pay

## 2022-01-02 DIAGNOSIS — K50818 Crohn's disease of both small and large intestine with other complication: Secondary | ICD-10-CM

## 2022-01-02 DIAGNOSIS — Z1152 Encounter for screening for COVID-19: Secondary | ICD-10-CM | POA: Diagnosis not present

## 2022-01-11 DIAGNOSIS — Z1152 Encounter for screening for COVID-19: Secondary | ICD-10-CM | POA: Diagnosis not present

## 2022-01-16 DIAGNOSIS — Z1152 Encounter for screening for COVID-19: Secondary | ICD-10-CM | POA: Diagnosis not present

## 2022-01-24 DIAGNOSIS — Z1152 Encounter for screening for COVID-19: Secondary | ICD-10-CM | POA: Diagnosis not present

## 2022-01-31 DIAGNOSIS — Z1152 Encounter for screening for COVID-19: Secondary | ICD-10-CM | POA: Diagnosis not present

## 2022-02-19 ENCOUNTER — Non-Acute Institutional Stay (HOSPITAL_COMMUNITY)
Admission: RE | Admit: 2022-02-19 | Discharge: 2022-02-19 | Disposition: A | Discharge status: Home / Self Care | Payer: Medicaid Other | Source: Ambulatory Visit | Attending: Internal Medicine | Admitting: Internal Medicine

## 2022-02-19 DIAGNOSIS — K50818 Crohn's disease of both small and large intestine with other complication: Secondary | ICD-10-CM | POA: Insufficient documentation

## 2022-02-19 MED ORDER — DIPHENHYDRAMINE HCL 25 MG PO CAPS
25.0000 mg | ORAL_CAPSULE | Freq: Once | ORAL | Status: AC
  Administered 2022-02-19: 50 mg via ORAL
  Filled 2022-02-19: qty 2

## 2022-02-19 MED ORDER — ACETAMINOPHEN 325 MG PO TABS
650.0000 mg | ORAL_TABLET | Freq: Once | ORAL | Status: AC
  Administered 2022-02-19: 650 mg via ORAL
  Filled 2022-02-19: qty 2

## 2022-02-19 MED ORDER — SODIUM CHLORIDE 0.9 % IV SOLN
10.0000 mg/kg | INTRAVENOUS | Status: DC
  Administered 2022-02-19: 700 mg via INTRAVENOUS
  Filled 2022-02-19: qty 70

## 2022-02-19 MED ORDER — SODIUM CHLORIDE 0.9 % IV SOLN: INTRAVENOUS | Status: DC | PRN

## 2022-02-19 NOTE — Progress Notes (Signed)
PATIENT CARE CENTER NOTE ?  ?  ?Diagnosis: Crohn's disease of both small and large intestine with other complication ([place]) (J95.369) ?  ?  ?Provider:  Justice Britain MD ?  ?  ?Procedure: Remicade infusion 766m ?  ?  ?Note: Patient received Remicade infusion (dose 1 of 6) via PIV. Patient premedicated with 650 mg Tylenol and 50 mg Benadryl PO as ordered. Infusion titrated per protocol. Patient tolerated well with no adverse reaction. Vital signs stable.Pt declined AVS and declined to stay for 1 hour observation. Patient to come back in 8 weeks for next infusion. Alert, oriented and ambulatory at discharge. ?

## 2022-02-22 DIAGNOSIS — Z1152 Encounter for screening for COVID-19: Secondary | ICD-10-CM | POA: Diagnosis not present

## 2022-02-28 DIAGNOSIS — Z1152 Encounter for screening for COVID-19: Secondary | ICD-10-CM | POA: Diagnosis not present

## 2022-03-13 DIAGNOSIS — Z1152 Encounter for screening for COVID-19: Secondary | ICD-10-CM | POA: Diagnosis not present

## 2022-03-22 DIAGNOSIS — Z1152 Encounter for screening for COVID-19: Secondary | ICD-10-CM | POA: Diagnosis not present

## 2022-04-03 DIAGNOSIS — Z1152 Encounter for screening for COVID-19: Secondary | ICD-10-CM | POA: Diagnosis not present

## 2022-04-08 ENCOUNTER — Ambulatory Visit: Payer: Medicaid Other | Admitting: Physician Assistant

## 2022-04-15 ENCOUNTER — Ambulatory Visit: Payer: Medicaid Other | Admitting: Family

## 2022-04-17 ENCOUNTER — Encounter (HOSPITAL_COMMUNITY): Payer: Medicaid Other

## 2022-04-19 IMAGING — CT CT HEART MORP W/ CTA COR W/ SCORE W/ CA W/CM &/OR W/O CM
1 of 8 series · 3 of 20 positions shown, 4 images · IV contrast (APPLIED)
Comparison: None.

Addendum:
HISTORY: 30 yo female with chest pain, nonspecific

EXAM:
Cardiac/Coronary CTA
TECHNIQUE: The patient was scanned on a Siemens Force scanner.
PROTOCOL: A 100 kV prospective scan was triggered in the descending thoracic
aorta at 111 HU's. Axial non-contrast 3 mm slices were carried out
through the heart. The data set was analyzed on a dedicated work
station and scored using the Agatson method. Gantry rotation speed
was 250 msecs and collimation was .6 mm. Beta blockade and 0.8 mg of
sl NTG was given. The 3D data set was reconstructed in 5% intervals
of the 35-75 % of the R-R cycle. Diastolic phases were analyzed on a
dedicated work station using MPR, MIP and VRT modes. The patient
received of contrast.

[Series 14: best diast · axial · 0.39mm/px · z∈[+978,+1104]mm · 3 of 313 slices shown, 4 images]
[im 1/313  vessel]
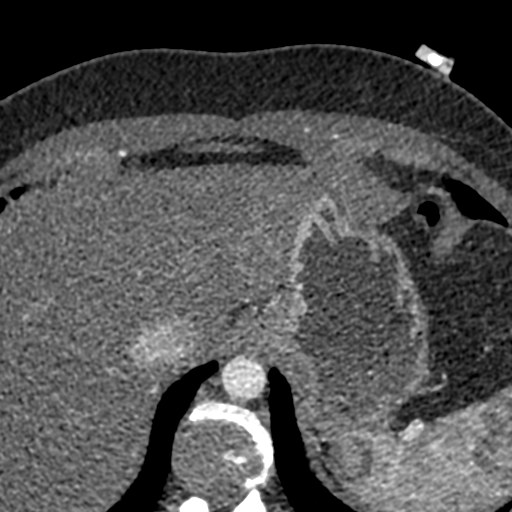
[im 1/313  lung]
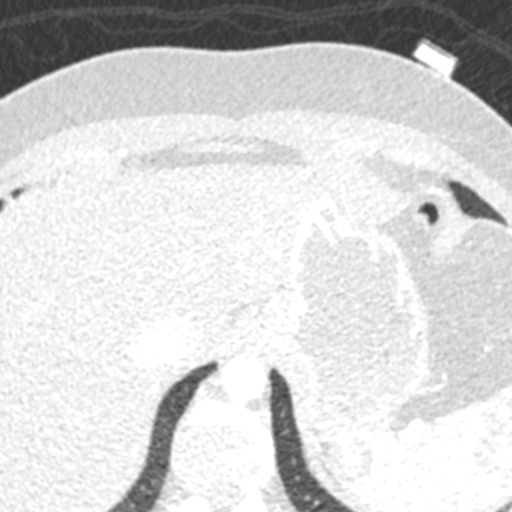
[im 157/313  vessel]
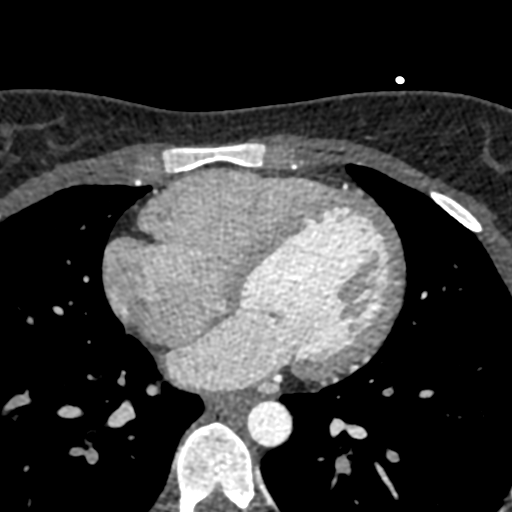
[im 313/313  vessel]
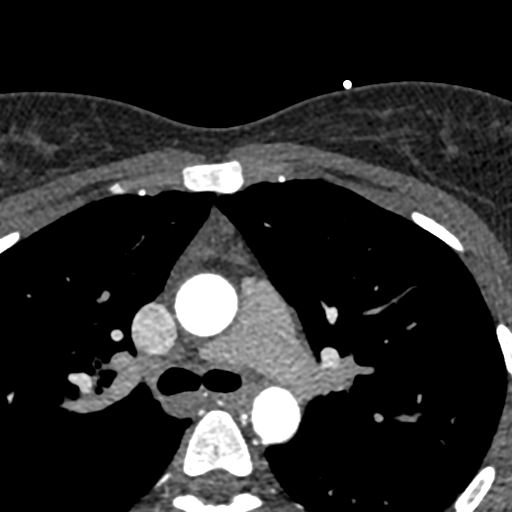

[3 of 20 positions shown; findings below may reference images not displayed]

FINDINGS: Quality: Good, HR 60

Coronary calcium score: The patient's coronary artery calcium score
is 0, which places the patient in the 0 percentile.

Coronary arteries: Normal coronary origins.  Left dominance.

Right Coronary Artery: Small non-dominant artery with no significant
disease.

Left Main Coronary Artery: Normal. Bifurcates into the LAD and LCx
arteries.

Left Anterior Descending Coronary Artery: Anterior artery, does not
appear to reach the apex. No disease. Small diagonal branch without
disease.

Left Circumflex Artery: Dominant. AV groove LCx without disease.
Large proximal OM branch which bifurcates, no disease. L-PDA/PLB
branches without disease.

Aorta: Normal size, 24 mm at the mid ascending aorta (level of the
PA bifurcation) measured double oblique. No calcifications. No
dissection.

Aortic Valve: Trileaflet. No calcifications.

Other findings:

Normal pulmonary vein drainage into the left atrium.

Normal left atrial appendage without a thrombus.

Normal size of the pulmonary artery.
IMPRESSION: 1. No evidence of CAD, CADRADS = 0.

2. Coronary calcium score of 0. This was 0 percentile for age and
sex matched control.

3. Normal coronary origin with left dominance.

4. Consider non-coronary causes of chest pain.

EXAM:
OVER-READ INTERPRETATION  CT CHEST

The following report is an over-read performed by radiologist Dr.
over-read does not include interpretation of cardiac or coronary
anatomy or pathology. The coronary CTA interpretation by the
cardiologist is attached.
FINDINGS: Vascular: Normal aortic caliber. No central pulmonary embolism, on
this non-dedicated study.

Mediastinum/Nodes: No imaged thoracic adenopathy. Incompletely
imaged anterior mediastinal soft tissue density is likely residual
thymus, normal in this age group.

Lungs/Pleura: No pleural fluid.  Clear imaged lungs.

Musculoskeletal: No acute osseous abnormality.
IMPRESSION: No acute findings in the imaged extracardiac chest.

*** End of Addendum ***
FINDINGS: Quality: Good, HR 60

Coronary calcium score: The patient's coronary artery calcium score
is 0, which places the patient in the 0 percentile.

Coronary arteries: Normal coronary origins.  Left dominance.

Right Coronary Artery: Small non-dominant artery with no significant
disease.

Left Main Coronary Artery: Normal. Bifurcates into the LAD and LCx
arteries.

Left Anterior Descending Coronary Artery: Anterior artery, does not
appear to reach the apex. No disease. Small diagonal branch without
disease.

Left Circumflex Artery: Dominant. AV groove LCx without disease.
Large proximal OM branch which bifurcates, no disease. L-PDA/PLB
branches without disease.

Aorta: Normal size, 24 mm at the mid ascending aorta (level of the
PA bifurcation) measured double oblique. No calcifications. No
dissection.

Aortic Valve: Trileaflet. No calcifications.

Other findings:

Normal pulmonary vein drainage into the left atrium.

Normal left atrial appendage without a thrombus.

Normal size of the pulmonary artery.
IMPRESSION: 1. No evidence of CAD, CADRADS = 0.

2. Coronary calcium score of 0. This was 0 percentile for age and
sex matched control.

3. Normal coronary origin with left dominance.

4. Consider non-coronary causes of chest pain.

## 2022-04-22 NOTE — Progress Notes (Deleted)
Patient ID: Erika Stephens, female    DOB: Mar 31, 1991  MRN: [id number]  CC: No chief complaint on file.   Subjective: Erika Stephens is a 31 y.o. female who presents for  Her concerns today include:   Unsure of today's appt, schedule notes only say follow-up   Cards chest pain  Psychology anxiety    Patient Active Problem List   Diagnosis Date Noted   Anxiety disorder 04/03/2021   Bipolar disorder ([place]) 04/03/2021   Hemorrhoids 12/26/2020   Chest pain of uncertain etiology 83/66/2947   Essential hypertension 12/25/2020   History of pre-eclampsia 12/25/2020   ASCUS of cervix with negative high risk HPV 11/27/2020   Iron deficiency anemia 11/14/2020   Fecal urgency 10/16/2020   Nasal sore 10/16/2020   Crohn's disease of both small and large intestine with other complication ([place]) 65/46/5035   History of iron deficiency anemia 03/08/2020   Infliximab (Remicade) long-term use 03/08/2020   History of immunosuppression therapy 03/08/2020   Generalized abdominal pain 03/08/2020   Bloating symptom 03/08/2020   Chronic diarrhea 03/08/2020   Cyst of ovary 12/21/2016   Crohn's disease ([place]) 11/27/2015     Current Outpatient Medications on File Prior to Visit  Medication Sig Dispense Refill   benzonatate (TESSALON) 100 MG capsule Take 1-2 capsules (100-200 mg total) by mouth 3 (three) times daily as needed for cough. 60 capsule 0   celecoxib (CELEBREX) 100 MG capsule Take 1 capsule (100 mg total) by mouth 2 (two) times daily. 30 capsule 0   cetirizine (ZYRTEC ALLERGY) 10 MG tablet Take 1 tablet (10 mg total) by mouth daily. 30 tablet 0   guaiFENesin 200 MG tablet Take 1 tablet (200 mg total) by mouth every 6 (six) hours as needed for cough or to loosen phlegm. 30 tablet 0   inFLIXimab (REMICADE IV) Inject into the vein. Every 8 weeks     metoprolol tartrate (LOPRESSOR) 100 MG tablet Take 1 tablet (100 mg total) by mouth Once PRN (take two hours before your Cardiac CT). 1 tablet 0    metroNIDAZOLE (FLAGYL) 500 MG tablet Take 1 tablet (500 mg total) by mouth 2 (two) times daily. 14 tablet 0   Multiple Vitamin (MULTIVITAMIN ADULT PO) Take by mouth daily.     Multiple Vitamins-Minerals (HAIR SKIN AND NAILS FORMULA) TABS Take 1 tablet by mouth daily.     naproxen (NAPROSYN) 500 MG tablet Take 1 tablet (500 mg total) by mouth 2 (two) times daily. 30 tablet 0   nitrofurantoin, macrocrystal-monohydrate, (MACROBID) 100 MG capsule Take 1 capsule (100 mg total) by mouth 2 (two) times daily. 10 capsule 0   promethazine-dextromethorphan (PROMETHAZINE-DM) 6.25-15 MG/5ML syrup Take 5 mLs by mouth at bedtime as needed for cough. 100 mL 0   pseudoephedrine (SUDAFED) 60 MG tablet Take 1 tablet (60 mg total) by mouth every 8 (eight) hours as needed for congestion. 30 tablet 0   No current facility-administered medications on file prior to visit.    Allergies  Allergen Reactions   Adalimumab Swelling   Aspirin Hives    Social History   Socioeconomic History   Marital status: Single    Spouse name: Not on file   Number of children: Not on file   Years of education: Not on file   Highest education level: Not on file  Occupational History   Not on file  Tobacco Use   Smoking status: Never   Smokeless tobacco: Never  Vaping Use   Vaping Use: Never  used  Substance and Sexual Activity   Alcohol use: Yes    Comment: socially.    Drug use: Not Currently    Types: Marijuana    Comment: last used 04-17-20 per pt   Sexual activity: Not Currently    Birth control/protection: Implant  Other Topics Concern   Not on file  Social History Narrative   Not on file   Social Determinants of Health   Financial Resource Strain: Not on file  Food Insecurity: Not on file  Transportation Needs: Not on file  Physical Activity: Not on file  Stress: Not on file  Social Connections: Not on file  Intimate Partner Violence: Not on file    Family History  Problem Relation Age of Onset    Crohn's disease Mother    Schizophrenia Father    Bipolar disorder Father    Crohn's disease Maternal Grandmother    Prostate cancer Maternal Uncle    Colon cancer Neg Hx    Stomach cancer Neg Hx    Pancreatic cancer Neg Hx    Esophageal cancer Neg Hx    Inflammatory bowel disease Neg Hx    Liver disease Neg Hx    Rectal cancer Neg Hx     Past Surgical History:  Procedure Laterality Date   APPENDECTOMY     BOWEL RESECTION     COLONOSCOPY     UPPER GASTROINTESTINAL ENDOSCOPY      ROS: Review of Systems Negative except as stated above  PHYSICAL EXAM: There were no vitals taken for this visit.  Physical Exam  {female adult master:[id number]} {female adult master:310785}     Latest Ref Rng & Units 11/20/2020   10:12 AM 11/14/2020   12:39 PM 10/15/2020    9:26 AM  CMP  Glucose 65 - 99 mg/dL 76  84  74   BUN 6 - 20 mg/dL 13  13  16    Creatinine 0.57 - 1.00 mg/dL 0.89  0.84  0.81   Sodium 134 - 144 mmol/L 139  137  138   Potassium 3.5 - 5.2 mmol/L 4.0  3.8  3.8   Chloride 96 - 106 mmol/L 104  106  107   CO2 20 - 29 mmol/L 22  26  25    Calcium 8.7 - 10.2 mg/dL 9.4  9.5  9.4   Total Protein 6.0 - 8.5 g/dL 7.8  8.3  7.9   Total Bilirubin 0.0 - 1.2 mg/dL 0.2  0.4  0.3   Alkaline Phos 44 - 121 IU/L 77  73  62   AST 0 - 40 IU/L 21  21  18    ALT 0 - 32 IU/L 11  15  9     Lipid Panel     Component Value Date/Time   CHOL 160 11/20/2020 1012   TRIG 102 11/20/2020 1012   HDL 52 11/20/2020 1012   CHOLHDL 3.1 11/20/2020 1012   LDLCALC 89 11/20/2020 1012    CBC    Component Value Date/Time   WBC 8.4 04/03/2021 1115   WBC 8.4 01/07/2021 1249   WBC 10.1 12/24/2020 1452   RBC 4.60 04/03/2021 1115   RBC 4.07 01/07/2021 1249   HGB 13.2 04/03/2021 1115   HCT 40.4 04/03/2021 1115   PLT 303 04/03/2021 1115   MCV 88 04/03/2021 1115   MCH 28.7 04/03/2021 1115   MCH 22.6 (L) 01/07/2021 1249   MCHC 32.7 04/03/2021 1115   MCHC 29.7 (L) 01/07/2021 1249   RDW 14.5 04/03/2021 1115  LYMPHSABS 3.4 01/07/2021 1249   MONOABS 0.5 01/07/2021 1249   EOSABS 0.2 01/07/2021 1249   BASOSABS 0.1 01/07/2021 1249    ASSESSMENT AND PLAN:  There are no diagnoses linked to this encounter.   Patient was given the opportunity to ask questions.  Patient verbalized understanding of the plan and was able to repeat key elements of the plan. Patient was given clear instructions to go to Emergency Department or return to medical center if symptoms don't improve, worsen, or new problems develop.The patient verbalized understanding.   No orders of the defined types were placed in this encounter.    Requested Prescriptions    No prescriptions requested or ordered in this encounter    No follow-ups on file.  Camillia Herter, NP

## 2022-04-23 ENCOUNTER — Non-Acute Institutional Stay (HOSPITAL_COMMUNITY)
Admission: RE | Admit: 2022-04-23 | Discharge: 2022-04-23 | Disposition: A | Discharge status: Home / Self Care | Payer: Medicaid Other | Source: Ambulatory Visit | Attending: Internal Medicine | Admitting: Internal Medicine

## 2022-04-23 DIAGNOSIS — K50818 Crohn's disease of both small and large intestine with other complication: Secondary | ICD-10-CM | POA: Insufficient documentation

## 2022-04-23 MED ORDER — SODIUM CHLORIDE 0.9 % IV SOLN
INTRAVENOUS | Status: DC | PRN
Start: 2022-04-23 — End: 2022-04-24

## 2022-04-23 MED ORDER — SODIUM CHLORIDE 0.9 % IV SOLN
700.0000 mg | INTRAVENOUS | Status: DC
  Administered 2022-04-23: 700 mg via INTRAVENOUS
  Filled 2022-04-23: qty 70

## 2022-04-23 MED ORDER — ACETAMINOPHEN 325 MG PO TABS
650.0000 mg | ORAL_TABLET | Freq: Once | ORAL | Status: AC
  Administered 2022-04-23: 650 mg via ORAL
  Filled 2022-04-23: qty 2

## 2022-04-23 MED ORDER — DIPHENHYDRAMINE HCL 25 MG PO CAPS
25.0000 mg | ORAL_CAPSULE | Freq: Once | ORAL | Status: AC
  Administered 2022-04-23: 50 mg via ORAL
  Filled 2022-04-23: qty 2

## 2022-04-23 NOTE — Progress Notes (Signed)
PATIENT CARE CENTER NOTE     Diagnosis: Crohn's disease of both small and large intestine with other complication ([place]) (K02.542)     Provider:  Justice Britain MD     Procedure: Remicade infusion 773m     Note: Patient received Remicade infusion (dose 2 of 6) via PIV. Patient pre-medicated with 650 mg Tylenol and 50 mg Benadryl PO as ordered. Infusion titrated per protocol. Patient tolerated well with no adverse reaction. Vital signs stable.Pt declined AVS and declined to stay for 1 hour observation. Patient to come back in 8 weeks for next infusion. Alert, oriented and ambulatory at discharge.

## 2022-04-27 ENCOUNTER — Ambulatory Visit: Payer: Medicaid Other | Admitting: Family

## 2022-04-29 ENCOUNTER — Ambulatory Visit: Payer: Medicaid Other | Admitting: Family

## 2022-05-11 NOTE — Progress Notes (Deleted)
Patient ID: Erika Stephens, female    DOB: April 10, 1991  MRN: [id number]  CC: No chief complaint on file.   Subjective: Erika Stephens is a 31 y.o. female who presents for  Her concerns today include:  Unsure today's appt, no recent appt within last 6 months with anyone at [facility]...   Patient Active Problem List   Diagnosis Date Noted   Anxiety disorder 04/03/2021   Bipolar disorder ([place]) 04/03/2021   Hemorrhoids 12/26/2020   Chest pain of uncertain etiology 44/96/7591   Essential hypertension 12/25/2020   History of pre-eclampsia 12/25/2020   ASCUS of cervix with negative high risk HPV 11/27/2020   Iron deficiency anemia 11/14/2020   Fecal urgency 10/16/2020   Nasal sore 10/16/2020   Crohn's disease of both small and large intestine with other complication ([place]) 63/84/6659   History of iron deficiency anemia 03/08/2020   Infliximab (Remicade) long-term use 03/08/2020   History of immunosuppression therapy 03/08/2020   Generalized abdominal pain 03/08/2020   Bloating symptom 03/08/2020   Chronic diarrhea 03/08/2020   Cyst of ovary 12/21/2016   Crohn's disease ([place]) 11/27/2015     Current Outpatient Medications on File Prior to Visit  Medication Sig Dispense Refill   benzonatate (TESSALON) 100 MG capsule Take 1-2 capsules (100-200 mg total) by mouth 3 (three) times daily as needed for cough. 60 capsule 0   celecoxib (CELEBREX) 100 MG capsule Take 1 capsule (100 mg total) by mouth 2 (two) times daily. 30 capsule 0   cetirizine (ZYRTEC ALLERGY) 10 MG tablet Take 1 tablet (10 mg total) by mouth daily. 30 tablet 0   guaiFENesin 200 MG tablet Take 1 tablet (200 mg total) by mouth every 6 (six) hours as needed for cough or to loosen phlegm. 30 tablet 0   inFLIXimab (REMICADE IV) Inject into the vein. Every 8 weeks     metoprolol tartrate (LOPRESSOR) 100 MG tablet Take 1 tablet (100 mg total) by mouth Once PRN (take two hours before your Cardiac CT). 1 tablet 0   metroNIDAZOLE  (FLAGYL) 500 MG tablet Take 1 tablet (500 mg total) by mouth 2 (two) times daily. 14 tablet 0   Multiple Vitamin (MULTIVITAMIN ADULT PO) Take by mouth daily.     Multiple Vitamins-Minerals (HAIR SKIN AND NAILS FORMULA) TABS Take 1 tablet by mouth daily.     naproxen (NAPROSYN) 500 MG tablet Take 1 tablet (500 mg total) by mouth 2 (two) times daily. 30 tablet 0   nitrofurantoin, macrocrystal-monohydrate, (MACROBID) 100 MG capsule Take 1 capsule (100 mg total) by mouth 2 (two) times daily. 10 capsule 0   promethazine-dextromethorphan (PROMETHAZINE-DM) 6.25-15 MG/5ML syrup Take 5 mLs by mouth at bedtime as needed for cough. 100 mL 0   pseudoephedrine (SUDAFED) 60 MG tablet Take 1 tablet (60 mg total) by mouth every 8 (eight) hours as needed for congestion. 30 tablet 0   No current facility-administered medications on file prior to visit.    Allergies  Allergen Reactions   Adalimumab Swelling   Aspirin Hives    Social History   Socioeconomic History   Marital status: Single    Spouse name: Not on file   Number of children: Not on file   Years of education: Not on file   Highest education level: Not on file  Occupational History   Not on file  Tobacco Use   Smoking status: Never   Smokeless tobacco: Never  Vaping Use   Vaping Use: Never used  Substance and Sexual  Activity   Alcohol use: Yes    Comment: socially.    Drug use: Not Currently    Types: Marijuana    Comment: last used 04-17-20 per pt   Sexual activity: Not Currently    Birth control/protection: Implant  Other Topics Concern   Not on file  Social History Narrative   Not on file   Social Determinants of Health   Financial Resource Strain: Not on file  Food Insecurity: Not on file  Transportation Needs: Not on file  Physical Activity: Not on file  Stress: Not on file  Social Connections: Not on file  Intimate Partner Violence: Not on file    Family History  Problem Relation Age of Onset   Crohn's disease  Mother    Schizophrenia Father    Bipolar disorder Father    Crohn's disease Maternal Grandmother    Prostate cancer Maternal Uncle    Colon cancer Neg Hx    Stomach cancer Neg Hx    Pancreatic cancer Neg Hx    Esophageal cancer Neg Hx    Inflammatory bowel disease Neg Hx    Liver disease Neg Hx    Rectal cancer Neg Hx     Past Surgical History:  Procedure Laterality Date   APPENDECTOMY     BOWEL RESECTION     COLONOSCOPY     UPPER GASTROINTESTINAL ENDOSCOPY      ROS: Review of Systems Negative except as stated above  PHYSICAL EXAM: There were no vitals taken for this visit.  Physical Exam  {female adult master:310786} {female adult master:310785}     Latest Ref Rng & Units 11/20/2020   10:12 AM 11/14/2020   12:39 PM 10/15/2020    9:26 AM  CMP  Glucose 65 - 99 mg/dL 76  84  74   BUN 6 - 20 mg/dL 13  13  16    Creatinine 0.57 - 1.00 mg/dL 0.89  0.84  0.81   Sodium 134 - 144 mmol/L 139  137  138   Potassium 3.5 - 5.2 mmol/L 4.0  3.8  3.8   Chloride 96 - 106 mmol/L 104  106  107   CO2 20 - 29 mmol/L 22  26  25    Calcium 8.7 - 10.2 mg/dL 9.4  9.5  9.4   Total Protein 6.0 - 8.5 g/dL 7.8  8.3  7.9   Total Bilirubin 0.0 - 1.2 mg/dL 0.2  0.4  0.3   Alkaline Phos 44 - 121 IU/L 77  73  62   AST 0 - 40 IU/L 21  21  18    ALT 0 - 32 IU/L 11  15  9     Lipid Panel     Component Value Date/Time   CHOL 160 11/20/2020 1012   TRIG 102 11/20/2020 1012   HDL 52 11/20/2020 1012   CHOLHDL 3.1 11/20/2020 1012   LDLCALC 89 11/20/2020 1012    CBC    Component Value Date/Time   WBC 8.4 04/03/2021 1115   WBC 8.4 01/07/2021 1249   WBC 10.1 12/24/2020 1452   RBC 4.60 04/03/2021 1115   RBC 4.07 01/07/2021 1249   HGB 13.2 04/03/2021 1115   HCT 40.4 04/03/2021 1115   PLT 303 04/03/2021 1115   MCV 88 04/03/2021 1115   MCH 28.7 04/03/2021 1115   MCH 22.6 (L) 01/07/2021 1249   MCHC 32.7 04/03/2021 1115   MCHC 29.7 (L) 01/07/2021 1249   RDW 14.5 04/03/2021 1115   LYMPHSABS 3.4  01/07/2021  1249   MONOABS 0.5 01/07/2021 1249   EOSABS 0.2 01/07/2021 1249   BASOSABS 0.1 01/07/2021 1249    ASSESSMENT AND PLAN:  There are no diagnoses linked to this encounter.   Patient was given the opportunity to ask questions.  Patient verbalized understanding of the plan and was able to repeat key elements of the plan. Patient was given clear instructions to go to Emergency Department or return to medical center if symptoms don't improve, worsen, or new problems develop.The patient verbalized understanding.   No orders of the defined types were placed in this encounter.    Requested Prescriptions    No prescriptions requested or ordered in this encounter    No follow-ups on file.  Camillia Herter, NP

## 2022-05-14 ENCOUNTER — Other Ambulatory Visit: Payer: Self-pay | Admitting: Surgery

## 2022-05-14 DIAGNOSIS — D509 Iron deficiency anemia, unspecified: Secondary | ICD-10-CM

## 2022-05-18 ENCOUNTER — Other Ambulatory Visit: Payer: Self-pay

## 2022-05-18 ENCOUNTER — Inpatient Hospital Stay (HOSPITAL_BASED_OUTPATIENT_CLINIC_OR_DEPARTMENT_OTHER): Payer: Medicaid Other | Admitting: Internal Medicine

## 2022-05-18 ENCOUNTER — Ambulatory Visit: Payer: Medicaid Other | Admitting: Family

## 2022-05-18 ENCOUNTER — Inpatient Hospital Stay: Payer: Medicaid Other | Attending: Internal Medicine

## 2022-05-18 VITALS — BP 113/75 | HR 76 | Temp 97.9°F | Resp 15 | Wt 152.2 lb

## 2022-05-18 DIAGNOSIS — E538 Deficiency of other specified B group vitamins: Secondary | ICD-10-CM | POA: Diagnosis not present

## 2022-05-18 DIAGNOSIS — D500 Iron deficiency anemia secondary to blood loss (chronic): Secondary | ICD-10-CM | POA: Insufficient documentation

## 2022-05-18 DIAGNOSIS — K5090 Crohn's disease, unspecified, without complications: Secondary | ICD-10-CM | POA: Diagnosis not present

## 2022-05-18 DIAGNOSIS — K922 Gastrointestinal hemorrhage, unspecified: Secondary | ICD-10-CM | POA: Insufficient documentation

## 2022-05-18 DIAGNOSIS — D539 Nutritional anemia, unspecified: Secondary | ICD-10-CM | POA: Diagnosis not present

## 2022-05-18 DIAGNOSIS — N920 Excessive and frequent menstruation with regular cycle: Secondary | ICD-10-CM | POA: Diagnosis not present

## 2022-05-18 DIAGNOSIS — D509 Iron deficiency anemia, unspecified: Secondary | ICD-10-CM

## 2022-05-18 LAB — CBC WITH DIFFERENTIAL (CANCER CENTER ONLY)
Abs Immature Granulocytes: 0.02 10*3/uL (ref 0.00–0.07)
Basophils Absolute: 0.1 10*3/uL (ref 0.0–0.1)
Basophils Relative: 1 %
Eosinophils Absolute: 0.2 10*3/uL (ref 0.0–0.5)
Eosinophils Relative: 2 %
HCT: 28 % — ABNORMAL LOW (ref 36.0–46.0)
Hemoglobin: 8.4 g/dL — ABNORMAL LOW (ref 12.0–15.0)
Immature Granulocytes: 0 %
Lymphocytes Relative: 32 %
Lymphs Abs: 2.9 10*3/uL (ref 0.7–4.0)
MCH: 21.6 pg — ABNORMAL LOW (ref 26.0–34.0)
MCHC: 30 g/dL (ref 30.0–36.0)
MCV: 72.2 fL — ABNORMAL LOW (ref 80.0–100.0)
Monocytes Absolute: 0.7 10*3/uL (ref 0.1–1.0)
Monocytes Relative: 8 %
Neutro Abs: 5.2 10*3/uL (ref 1.7–7.7)
Neutrophils Relative %: 57 %
Platelet Count: 313 10*3/uL (ref 150–400)
RBC: 3.88 MIL/uL (ref 3.87–5.11)
RDW: 16.4 % — ABNORMAL HIGH (ref 11.5–15.5)
WBC Count: 9.1 10*3/uL (ref 4.0–10.5)
nRBC: 0 % (ref 0.0–0.2)

## 2022-05-18 LAB — IRON AND IRON BINDING CAPACITY (CC-WL,HP ONLY)
Iron: 15 ug/dL — ABNORMAL LOW (ref 28–170)
Saturation Ratios: 3 % — ABNORMAL LOW (ref 10.4–31.8)
TIBC: 438 ug/dL (ref 250–450)
UIBC: 423 ug/dL (ref 148–442)

## 2022-05-18 LAB — FERRITIN: Ferritin: 3 ng/mL — ABNORMAL LOW (ref 11–307)

## 2022-05-18 LAB — FOLATE: Folate: 9 ng/mL (ref >5.9)

## 2022-05-18 LAB — VITAMIN B12: Vitamin B-12: 954 pg/mL — ABNORMAL HIGH (ref 180–914)

## 2022-05-18 NOTE — Progress Notes (Signed)
[place] [phone]   [phone]  OFFICE PROGRESS NOTE  [name], MD [address], [place] [address]  DIAGNOSIS:  1) Iron deficiency anemia secondary to gastrointestinal as well as gynecological blood loss 2) vitamin B12 deficiency secondary to chronic disease and ileum resection  PRIOR THERAPY: Feraheme infusion on as-needed basis last dose was given in March 2022  CURRENT THERAPY: Oral iron tablet  INTERVAL HISTORY: Erika Stephens 31 y.o. female returns to the clinic today for follow-up visit.  The patient was last seen more than a year ago.  She was treated with Feraheme infusion at that time.  She was supposed to come back in May 2022 but the patient missed her appointment because of changes in her job and insurance.  She recently called for follow-up visit because of increasing fatigue and weakness.  She denied having any dizzy spells but has lack of concentration at times.  She also has a lot of craving for ice.  She denied having any chest pain, shortness of breath, cough or hemoptysis.  She has no nausea, vomiting, diarrhea or constipation.  She has no headache or visual changes.  She continues her treatment with Remicade by gastroenterology for the history of chron's disease.  The patient is here today for evaluation and repeat blood work.  MEDICAL HISTORY: Past Medical History:  Diagnosis Date   Anemia    Anxiety    Blood transfusion without reported diagnosis    Central centrifugal scarring alopecia    Chronic swimmer's ear of right side    Crohn's colitis ([place])    Hypertension    [employer]reesia 09/10/2020   IDA (iron deficiency anemia)    Impetigo    Postinflammatory hyperpigmentation    Scalp psoriasis    Seborrheic dermatitis of scalp     ALLERGIES:  is allergic to adalimumab and aspirin.  MEDICATIONS:  Current Outpatient Medications  Medication Sig Dispense Refill   benzonatate (TESSALON) 100 MG  capsule Take 1-2 capsules (100-200 mg total) by mouth 3 (three) times daily as needed for cough. 60 capsule 0   celecoxib (CELEBREX) 100 MG capsule Take 1 capsule (100 mg total) by mouth 2 (two) times daily. 30 capsule 0   cetirizine (ZYRTEC ALLERGY) 10 MG tablet Take 1 tablet (10 mg total) by mouth daily. 30 tablet 0   guaiFENesin 200 MG tablet Take 1 tablet (200 mg total) by mouth every 6 (six) hours as needed for cough or to loosen phlegm. 30 tablet 0   inFLIXimab (REMICADE IV) Inject into the vein. Every 8 weeks     metoprolol tartrate (LOPRESSOR) 100 MG tablet Take 1 tablet (100 mg total) by mouth Once PRN (take two hours before your Cardiac CT). 1 tablet 0   metroNIDAZOLE (FLAGYL) 500 MG tablet Take 1 tablet (500 mg total) by mouth 2 (two) times daily. 14 tablet 0   Multiple Vitamin (MULTIVITAMIN ADULT PO) Take by mouth daily.     Multiple Vitamins-Minerals (HAIR SKIN AND NAILS FORMULA) TABS Take 1 tablet by mouth daily.     naproxen (NAPROSYN) 500 MG tablet Take 1 tablet (500 mg total) by mouth 2 (two) times daily. 30 tablet 0   nitrofurantoin, macrocrystal-monohydrate, (MACROBID) 100 MG capsule Take 1 capsule (100 mg total) by mouth 2 (two) times daily. 10 capsule 0   promethazine-dextromethorphan (PROMETHAZINE-DM) 6.25-15 MG/5ML syrup Take 5 mLs by mouth at bedtime as needed for cough. 100 mL 0   pseudoephedrine (SUDAFED)  60 MG tablet Take 1 tablet (60 mg total) by mouth every 8 (eight) hours as needed for congestion. 30 tablet 0   No current facility-administered medications for this visit.    SURGICAL HISTORY:  Past Surgical History:  Procedure Laterality Date   APPENDECTOMY     BOWEL RESECTION     COLONOSCOPY     UPPER GASTROINTESTINAL ENDOSCOPY      REVIEW OF SYSTEMS:  A comprehensive review of systems was negative except for: Constitutional: positive for fatigue Neurological: positive for dizziness   PHYSICAL EXAMINATION: General appearance: alert, cooperative, fatigued,  and no distress Head: Normocephalic, without obvious abnormality, atraumatic Neck: no adenopathy, no JVD, supple, symmetrical, trachea midline, and thyroid not enlarged, symmetric, no tenderness/mass/nodules Lymph nodes: Cervical, supraclavicular, and axillary nodes normal. Resp: clear to auscultation bilaterally Back: symmetric, no curvature. ROM normal. No CVA tenderness. Cardio: regular rate and rhythm, S1, S2 normal, no murmur, click, rub or gallop GI: soft, non-tender; bowel sounds normal; no masses,  no organomegaly Extremities: extremities normal, atraumatic, no cyanosis or edema  ECOG PERFORMANCE STATUS: 1 - Symptomatic but completely ambulatory  Blood pressure 113/75, pulse 76, temperature 97.9 F (36.6 C), resp. rate 15, weight 152 lb 3.2 oz (69 kg), SpO2 100 %.  LABORATORY DATA: Lab Results  Component Value Date   WBC 9.1 05/18/2022   HGB 8.4 (L) 05/18/2022   HCT 28.0 (L) 05/18/2022   MCV 72.2 (L) 05/18/2022   PLT 313 05/18/2022      Chemistry      Component Value Date/Time   NA 139 11/20/2020 1012   K 4.0 11/20/2020 1012   CL 104 11/20/2020 1012   CO2 22 11/20/2020 1012   BUN 13 11/20/2020 1012   CREATININE 0.89 11/20/2020 1012   CREATININE 0.84 11/14/2020 1239      Component Value Date/Time   CALCIUM 9.4 11/20/2020 1012   ALKPHOS 77 11/20/2020 1012   AST 21 11/20/2020 1012   AST 21 11/14/2020 1239   ALT 11 11/20/2020 1012   ALT 15 11/14/2020 1239   BILITOT 0.2 11/20/2020 1012   BILITOT 0.4 11/14/2020 1239       RADIOGRAPHIC STUDIES: No results found.  ASSESSMENT AND PLAN: This is a very pleasant 31 years old African-American female with history of iron deficiency anemia secondary to menorrhagia as well as gastrointestinal blood loss.   The patient also has history of chron's disease and currently on treatment with Remicade.  She has a history of vitamin B12 deficiency. She has been on oral iron tablet with no improvement in her symptoms. She was  treated in the past with Feraheme infusion last dose was in March 2022.  The patient was lost to follow-up since that time. Repeat CBC today showed significant anemia with hemoglobin down to 8.4.  Iron study, ferritin, serum folate and vitamin B12 levels are still pending. I recommended for the patient to proceed with iron infusion again because of her severe microcytic anemia. She will be treated with Feraheme infusion 510 Mg IV weekly for 2 weeks if no insurance coverage issues otherwise she will be treated with Venofer 300 Mg IV weekly for 3 weeks. The patient will come back for follow-up visit in 2 months for evaluation and repeat blood work.  I strongly advised her to keep her appointments for close monitoring of her condition. The patient was advised also to continue her care with gastroenterology. She will call immediately if she has any other concerning symptoms in the interval. The  patient voices understanding of current disease status and treatment options and is in agreement with the current care plan.  All questions were answered. The patient knows to call the clinic with any problems, questions or concerns. We can certainly see the patient much sooner if necessary.  Disclaimer: This note was dictated with voice recognition software. Similar sounding words can inadvertently be transcribed and may not be corrected upon review.

## 2022-05-19 ENCOUNTER — Telehealth: Payer: Self-pay | Admitting: Pharmacy Technician

## 2022-05-19 NOTE — Telephone Encounter (Signed)
Auth Submission: NO AUTH NEEDED Payer: [place] [employer] Medication & CPT/J Code(s) submitted: Feraheme (ferumoxytol) [id number] Route of submission (phone, fax, portal): PHONE: [phone] Auth type: Buy/Bill Units/visits requested: X 2 DOSES Reference number: [id number] Approval from: 05/19/22 to 08/19/22

## 2022-05-21 ENCOUNTER — Ambulatory Visit (INDEPENDENT_AMBULATORY_CARE_PROVIDER_SITE_OTHER): Payer: Medicaid Other

## 2022-05-21 VITALS — BP 116/78 | HR 72 | Temp 97.9°F | Resp 16 | Ht 67.0 in | Wt 151.0 lb

## 2022-05-21 DIAGNOSIS — D500 Iron deficiency anemia secondary to blood loss (chronic): Secondary | ICD-10-CM | POA: Diagnosis not present

## 2022-05-21 MED ORDER — SODIUM CHLORIDE 0.9 % IV SOLN
510.0000 mg | INTRAVENOUS | Status: DC
  Administered 2022-05-21: 510 mg via INTRAVENOUS
  Filled 2022-05-21: qty 17

## 2022-05-21 NOTE — Progress Notes (Signed)
Diagnosis: Iron Deficiency Anemia  Provider:  Marshell Garfinkel, MD  Procedure: Infusion  IV Type: Peripheral, IV Location: L Antecubital  Feraheme (Ferumoxytol), Dose: 510 mg  Infusion Start Time: 2493  Infusion Stop Time: 2419  Post Infusion IV Care: Peripheral IV Discontinued  Discharge: Condition: Good, Destination: Home . AVS provided to patient.   Performed by:  Adelina Mings, LPN

## 2022-05-28 ENCOUNTER — Ambulatory Visit (INDEPENDENT_AMBULATORY_CARE_PROVIDER_SITE_OTHER): Payer: Medicaid Other

## 2022-05-28 VITALS — BP 106/67 | HR 60 | Temp 99.1°F | Resp 18 | Ht 62.0 in | Wt 154.4 lb

## 2022-05-28 DIAGNOSIS — D500 Iron deficiency anemia secondary to blood loss (chronic): Secondary | ICD-10-CM | POA: Diagnosis not present

## 2022-05-28 MED ORDER — SODIUM CHLORIDE 0.9 % IV SOLN
510.0000 mg | INTRAVENOUS | Status: DC
  Administered 2022-05-28: 510 mg via INTRAVENOUS
  Filled 2022-05-28: qty 17

## 2022-05-28 NOTE — Patient Instructions (Addendum)
Excuse from Work, [employer], or Physical Activity ___[name]______________________ needs to be excused from: _X___ Work. ____ School. ____ Physical activity. This is effective for the following dates: _____7/20/23_________________________________. He or she may return to work or school, but should avoid physical activity or other activities from now until _______________. Activity restrictions include: ____ Lifting more than _________ lb / kg. ____ Sitting longer than __________ minutes at a time. ____ Standing longer than ________ minutes at a time. ____ Other activities including: ___________________________________________________________________________________ __X__ He or she may return to full physical activity on ___7/21/23_____________. Health care provider name (printed): Koren Shiver, RN___________________________________________________ Health care provider (signature): _________________________________________________________ Date: ____7/20/23__________________________ This information is not intended to replace advice given to you by your health care provider. Make sure you discuss any questions you have with your health care provider. Document Revised: 07/16/2021 Document Reviewed: 07/16/2021 Elsevier Patient Education  [place].

## 2022-05-28 NOTE — Progress Notes (Signed)
Diagnosis: Iron Deficiency Anemia  Provider:  Marshell Garfinkel, MD  Procedure: Infusion  IV Type: Peripheral, IV Location: L Antecubital  Feraheme (Ferumoxytol), Dose: 510 mg  Infusion Start Time: 9030  Infusion Stop Time: 1499  Post Infusion IV Care: Peripheral IV Discontinued  Discharge: Condition: Good, Destination: Home . AVS provided to patient.   Performed by:  Cleophus Molt, RN

## 2022-06-01 NOTE — Progress Notes (Signed)
Patient ID: Erika Stephens, female    DOB: Aug 15, 1991  MRN: [id number]  CC: Acne   Subjective: Erika Stephens is a 31 y.o. female who presents for acne.  Her concerns today include:  Concerns for painful bump right axilla area. Reports she popped the bump in the past and had drainage from it. Has fine bumps bilateral axilla. Acne of face. Dry skin especially of the nose and bilateral nares. She has not recently changed her skin regimen.   Patient Active Problem List   Diagnosis Date Noted   Anxiety disorder 04/03/2021   Bipolar disorder ([place]) 04/03/2021   Hemorrhoids 12/26/2020   Chest pain of uncertain etiology 08/81/1031   Essential hypertension 12/25/2020   History of pre-eclampsia 12/25/2020   ASCUS of cervix with negative high risk HPV 11/27/2020   Iron deficiency anemia 11/14/2020   Fecal urgency 10/16/2020   Nasal sore 10/16/2020   Crohn's disease of both small and large intestine with other complication ([place]) 59/45/8592   History of iron deficiency anemia 03/08/2020   Infliximab (Remicade) long-term use 03/08/2020   History of immunosuppression therapy 03/08/2020   Generalized abdominal pain 03/08/2020   Bloating symptom 03/08/2020   Chronic diarrhea 03/08/2020   Cyst of ovary 12/21/2016   Crohn's disease ([place]) 11/27/2015     Current Outpatient Medications on File Prior to Visit  Medication Sig Dispense Refill   benzonatate (TESSALON) 100 MG capsule Take 1-2 capsules (100-200 mg total) by mouth 3 (three) times daily as needed for cough. (Patient not taking: Reported on 05/18/2022) 60 capsule 0   inFLIXimab (REMICADE IV) Inject into the vein. Every 8 weeks     No current facility-administered medications on file prior to visit.    Allergies  Allergen Reactions   Adalimumab Swelling   Aspirin Hives    Social History   Socioeconomic History   Marital status: Single    Spouse name: Not on file   Number of children: Not on file   Years of education: Not on file    Highest education level: Not on file  Occupational History   Not on file  Tobacco Use   Smoking status: Never    Passive exposure: Never   Smokeless tobacco: Never  Vaping Use   Vaping Use: Never used  Substance and Sexual Activity   Alcohol use: Yes    Comment: socially.    Drug use: Not Currently    Types: Marijuana    Comment: last used 04-17-20 per pt   Sexual activity: Not Currently    Birth control/protection: Implant  Other Topics Concern   Not on file  Social History Narrative   Not on file   Social Determinants of Health   Financial Resource Strain: Not on file  Food Insecurity: Not on file  Transportation Needs: Not on file  Physical Activity: Not on file  Stress: Not on file  Social Connections: Not on file  Intimate Partner Violence: Not on file    Family History  Problem Relation Age of Onset   Crohn's disease Mother    Schizophrenia Father    Bipolar disorder Father    Crohn's disease Maternal Grandmother    Prostate cancer Maternal Uncle    Colon cancer Neg Hx    Stomach cancer Neg Hx    Pancreatic cancer Neg Hx    Esophageal cancer Neg Hx    Inflammatory bowel disease Neg Hx    Liver disease Neg Hx    Rectal cancer Neg Hx  Past Surgical History:  Procedure Laterality Date   APPENDECTOMY     BOWEL RESECTION     COLONOSCOPY     UPPER GASTROINTESTINAL ENDOSCOPY      ROS: Review of Systems Negative except as stated above  PHYSICAL EXAM: BP 113/81 (BP Location: Left Arm, Patient Position: Sitting, Cuff Size: Normal)   Pulse 67   Temp 98.3 F (36.8 C)   Resp 16   Wt 148 lb (67.1 kg)   SpO2 98%   BMI 27.07 kg/m   Physical Exam HENT:     Head: Normocephalic and atraumatic.  Eyes:     Extraocular Movements: Extraocular movements intact.     Conjunctiva/sclera: Conjunctivae normal.     Pupils: Pupils are equal, round, and reactive to light.  Cardiovascular:     Rate and Rhythm: Normal rate and regular rhythm.     Pulses:  Normal pulses.     Heart sounds: Normal heart sounds.  Pulmonary:     Effort: Pulmonary effort is normal.     Breath sounds: Normal breath sounds.  Musculoskeletal:     Cervical back: Normal range of motion and neck supple.  Skin:    General: Skin is warm and dry.     Findings: Acne present.     Comments: Hyperpigmented firm nodule right axilla without drainage or erythema.   Neurological:     General: No focal deficit present.     Mental Status: She is alert and oriented to person, place, and time.  Psychiatric:        Mood and Affect: Mood normal.        Behavior: Behavior normal.     ASSESSMENT AND PLAN: 1. Acne vulgaris 2. Skin nodule 3. Dry skin - Referral to Dermatology for further evaluation and management.  - Ambulatory referral to Dermatology    Patient was given the opportunity to ask questions.  Patient verbalized understanding of the plan and was able to repeat key elements of the plan. Patient was given clear instructions to go to Emergency Department or return to medical center if symptoms don't improve, worsen, or new problems develop.The patient verbalized understanding.   Orders Placed This Encounter  Procedures   Ambulatory referral to Dermatology    Follow-up with primary provider as scheduled.   Camillia Herter, NP

## 2022-06-04 ENCOUNTER — Ambulatory Visit: Payer: Medicaid Other | Admitting: Internal Medicine

## 2022-06-04 ENCOUNTER — Other Ambulatory Visit: Payer: Medicaid Other

## 2022-06-09 ENCOUNTER — Encounter: Payer: Self-pay | Admitting: Family Medicine

## 2022-06-09 ENCOUNTER — Ambulatory Visit: Payer: Medicaid Other | Admitting: Family

## 2022-06-09 ENCOUNTER — Encounter: Payer: Self-pay | Admitting: Family

## 2022-06-09 VITALS — BP 113/81 | HR 67 | Temp 98.3°F | Resp 16 | Wt 148.0 lb

## 2022-06-09 DIAGNOSIS — R229 Localized swelling, mass and lump, unspecified: Secondary | ICD-10-CM | POA: Diagnosis not present

## 2022-06-09 DIAGNOSIS — L700 Acne vulgaris: Secondary | ICD-10-CM

## 2022-06-09 DIAGNOSIS — L853 Xerosis cutis: Secondary | ICD-10-CM

## 2022-06-09 NOTE — Progress Notes (Signed)
Pt presents for pimple under right axilla mildly painful, needs referral to dermatology fr breakouts and dry skin

## 2022-06-09 NOTE — Patient Instructions (Addendum)
Epidermoid Cyst  An epidermoid cyst, also known as epidermal cyst, is a sac made of skin tissue. The sac contains a substance called keratin. Keratin is a protein that is normally secreted through the hair follicles. When keratin becomes trapped in the top layer of skin (epidermis), it can form an epidermoid cyst. Epidermoid cysts can be found anywhere on your body. These cysts are usually harmless (benign), and they may not cause symptoms unless they become inflamed or infected. What are the causes? This condition may be caused by: A blocked hair follicle. A hair that curls and re-enters the skin instead of growing straight out of the skin (ingrown hair). A blocked pore. Irritated skin. An injury to the skin. Certain conditions that are passed along from parent to child (inherited). Human papillomavirus (HPV). This happens rarely when cysts occur on the bottom of the feet. Long-term (chronic) sun damage to the skin. What increases the risk? The following factors may make you more likely to develop an epidermoid cyst: Having acne. Being female. Having an injury to the skin. Being past puberty. Having certain rare genetic disorders. What are the signs or symptoms? The only symptom of this condition may be a small, painless lump underneath the skin. When an epidermal cyst ruptures, it may become inflamed. True infection in cysts is rare. Symptoms may include: Redness. Inflammation. Tenderness. Warmth. Keratin draining from the cyst. Keratin is grayish-white, bad-smelling substance. Pus draining from the cyst. How is this diagnosed? This condition is diagnosed with a physical exam. In some cases, you may have a sample of tissue (biopsy) taken from your cyst to be examined under a microscope or tested for bacteria. You may be referred to a health care provider who specializes in skin care (dermatologist). How is this treated? If a cyst becomes inflamed, treatment may include: Opening and  draining the cyst, done by a health care provider. After draining, minor surgery to remove the rest of the cyst may be done. Taking antibiotic medicine. Having injections of medicines (steroids) that help to reduce inflammation. Having surgery to remove the cyst. Surgery may be done if the cyst: Becomes large. Bothers you. Has a chance of turning into cancer. Do not try to open a cyst yourself. Follow these instructions at home: Medicines If you were prescribed an antibiotic medicine, take it it as told by your health care provider. Do not stop using the antibiotic even if you start to feel better. Take over-the-counter and prescription medicines only as told by your health care provider. General instructions Keep the area around your cyst clean and dry. Wear loose, dry clothing. Avoid touching your cyst. Check your cyst every day for signs of infection. Check for: Redness, swelling, or pain. Fluid or blood. Warmth. Pus or a bad smell. Keep all follow-up visits. This is important. How is this prevented? Wear clean, dry, clothing. Avoid wearing tight clothing. Keep your skin clean and dry. Take showers or baths every day. Contact a health care provider if: Your cyst develops symptoms of infection. Your condition is not improving or is getting worse. You develop a cyst that looks different from other cysts you have had. You have a fever. Get help right away if: Redness spreads from the cyst into the surrounding area. Summary An epidermoid cyst is a sac made of skin tissue. These cysts are usually harmless (benign), and they may not cause symptoms unless they become inflamed. If a cyst becomes inflamed, treatment may include surgery to open and drain the  cyst, or to remove it. Treatment may also include medicines by mouth or through an injection. Take over-the-counter and prescription medicines only as told by your health care provider. If you were prescribed an antibiotic medicine,  take it as told by your health care provider. Do not stop using the antibiotic even if you start to feel better. Contact a health care provider if your condition is not improving or is getting worse. Keep all follow-up visits as told by your health care provider. This is important. This information is not intended to replace advice given to you by your health care provider. Make sure you discuss any questions you have with your health care provider. Document Revised: 01/31/2020 Document Reviewed: 01/31/2020 Elsevier Patient Education  [place].

## 2022-06-18 ENCOUNTER — Encounter (HOSPITAL_COMMUNITY): Payer: Medicaid Other

## 2022-06-24 ENCOUNTER — Non-Acute Institutional Stay (HOSPITAL_COMMUNITY)
Admission: RE | Admit: 2022-06-24 | Discharge: 2022-06-24 | Disposition: A | Discharge status: Home / Self Care | Payer: Medicaid Other | Source: Ambulatory Visit | Attending: Internal Medicine | Admitting: Internal Medicine

## 2022-06-24 DIAGNOSIS — K50818 Crohn's disease of both small and large intestine with other complication: Secondary | ICD-10-CM | POA: Insufficient documentation

## 2022-06-24 MED ORDER — SODIUM CHLORIDE 0.9 % IV SOLN
700.0000 mg | INTRAVENOUS | Status: DC
  Administered 2022-06-24: 700 mg via INTRAVENOUS
  Filled 2022-06-24: qty 70

## 2022-06-24 MED ORDER — DIPHENHYDRAMINE HCL 25 MG PO CAPS
25.0000 mg | ORAL_CAPSULE | Freq: Once | ORAL | Status: AC
  Administered 2022-06-24: 50 mg via ORAL
  Filled 2022-06-24: qty 2

## 2022-06-24 MED ORDER — SODIUM CHLORIDE 0.9 % IV SOLN: INTRAVENOUS | Status: DC | PRN

## 2022-06-24 MED ORDER — ACETAMINOPHEN 325 MG PO TABS
650.0000 mg | ORAL_TABLET | Freq: Once | ORAL | Status: AC
  Administered 2022-06-24: 650 mg via ORAL
  Filled 2022-06-24: qty 2

## 2022-06-24 NOTE — Progress Notes (Signed)
PATIENT CARE CENTER NOTE   Diagnosis:  Crohn's disease of small and large intestine with other complication V42.595     Provider: Justice Britain MD     Procedure: Remicade 766m infusion     Note: Patient received Remicade infusion ( dose 3 of 6) via PIV. Patient premedicated with 6544mPO Tylenol and 5035mO Benadryl. Infusion titrated per protocol. Patient tolerated well with no adverse reaction. Pt stayed for 20 minutes after infusion completed, no s/s reaction noted and pt states she feels well. Vital signs stable. Pt declined AVS. Patient to come back every 8 weeks for infusion, instructed pt to schedule next appointment at front desk prior to leaving, verbalized understanding. Alert, oriented and ambulatory at discharge.

## 2022-07-01 ENCOUNTER — Encounter: Payer: Self-pay | Admitting: Gastroenterology

## 2022-07-01 ENCOUNTER — Other Ambulatory Visit (INDEPENDENT_AMBULATORY_CARE_PROVIDER_SITE_OTHER): Payer: Medicaid Other

## 2022-07-01 ENCOUNTER — Ambulatory Visit: Payer: Medicaid Other | Admitting: Gastroenterology

## 2022-07-01 VITALS — BP 106/64 | HR 82 | Ht 62.0 in | Wt 148.0 lb

## 2022-07-01 DIAGNOSIS — R630 Anorexia: Secondary | ICD-10-CM

## 2022-07-01 DIAGNOSIS — K529 Noninfective gastroenteritis and colitis, unspecified: Secondary | ICD-10-CM | POA: Diagnosis not present

## 2022-07-01 DIAGNOSIS — K50818 Crohn's disease of both small and large intestine with other complication: Secondary | ICD-10-CM | POA: Diagnosis not present

## 2022-07-01 DIAGNOSIS — R110 Nausea: Secondary | ICD-10-CM

## 2022-07-01 DIAGNOSIS — R634 Abnormal weight loss: Secondary | ICD-10-CM

## 2022-07-01 LAB — SEDIMENTATION RATE: Sed Rate: 54 mm/hr — ABNORMAL HIGH (ref 0–20)

## 2022-07-01 LAB — C-REACTIVE PROTEIN: CRP: <1 mg/dL (ref 0.5–20.0)

## 2022-07-01 MED ORDER — NA SULFATE-K SULFATE-MG SULF 17.5-3.13-1.6 GM/177ML PO SOLN: 1.0000 | Freq: Once | ORAL | 0 refills | Status: AC

## 2022-07-01 NOTE — Patient Instructions (Addendum)
Your provider has requested that you go to the basement level for lab work before leaving today. Press "B" on the elevator. The lab is located at the first door on the left as you exit the elevator.  Your provider has requested that you go to the basement level for lab work before next Remicade infusion on 08/21/22. Press "B" on the elevator. The lab is located at the first door on the left as you exit the elevator.  You have been scheduled for an endoscopy and colonoscopy. Please follow the written instructions given to you at your visit today. Please pick up your prep supplies at the pharmacy within the next 1-3 days. If you use inhalers (even only as needed), please bring them with you on the day of your procedure.  _______________________________________________________  If you are age 3 or older, your body mass index should be between 23-30. Your Body mass index is 27.07 kg/m. If this is out of the aforementioned range listed, please consider follow up with your Primary Care Provider.  If you are age 72 or younger, your body mass index should be between 19-25. Your Body mass index is 27.07 kg/m. If this is out of the aformentioned range listed, please consider follow up with your Primary Care Provider.   ________________________________________________________  The  GI providers would like to encourage you to use [facility] to communicate with providers for non-urgent requests or questions.  Due to long hold times on the telephone, sending your provider a message by [facility] may be a faster and more efficient way to get a response.  Please allow 48 business hours for a response.  Please remember that this is for non-urgent requests.  _______________________________________________________

## 2022-07-01 NOTE — Progress Notes (Signed)
[place] VISIT   Primary Care Provider [name], [address] [place] [id number] [phone]  Patient Profile: Erika Stephens is a 31 y.o. female with a pmh significant for Crohn's disease (diagnosed in 2011 and reinitiated on Remicade), status post appendectomy and ileocecectomy, anal fissures, possible psoriasis, MDD/anxiety, iron deficiency anemia, status post 3 pregnancies.  The patient presents to the [facility] for an evaluation and management of problem(s) noted below:  Problem List 1. Crohn's disease of both small and large intestine with other complication ([place])   2. Chronic diarrhea   3. Nausea   4. Anorexia   5. Unintentional weight loss      History of Present Illness Please see prior notes for full details of HPI  Interval History The patient returns for follow-up.  We have not seen her in clinic in nearly a year.  She had been doing well with IV iron infusions and then stopped doing them and recently was found to have more significant iron deficiency anemia.  She has been restarted on IV iron infusions.  She also states that over the course of the last few months she has been experiencing more issues of diarrhea.  She has stopped taking her cholestyramine which previously had been somewhat helpful.  She had also been taking Metamucil in a way of trying to bulk her stools but has noted without being on it as well that her stools are a little bit more loose than normal.  She has between 2 and 3 bowel movements per day that are soft to watery.  She has not noted any blood in her stools.  She recalls issues with her sinuses being a problem when she was first diagnosed with Crohn's disease.  Her Remicade had usually helped with that.  She is noticing some issues from that standpoint.  She has been experiencing some mild anorexia symptoms as well as nausea at times.  She has had some weight loss which has been  slightly unintentional though not severe  BMs per day -between 2 and 3 Nocturnal BMs -infrequent Blood -none Mucous -none Tenesmus -none Urgency -infrequent though more of an issue than previously Skin Manifestations -no new psoriatic-like manifestations Eye Manifestations -none Joint Manifestations -none  GI Review of Systems Positive as above Negative for dysphagia, odynophagia, nausea, vomiting, pain, melena, hematochezia  Review of Systems General: Denies fevers/chills HEENT: Denies new oral lesions Cardiovascular: Denies chest pain Pulmonary: Denies shortness of breath Gastroenterological: See HPI Genitourinary: Denies darkened urine or hematuria Hematological: Denies easy bruising daily that Dermatological: Denies current skin issues or jaundice Psychological: Mood is stable   Medications Current Outpatient Medications  Medication Sig Dispense Refill   inFLIXimab (REMICADE IV) Inject into the vein. Every 8 weeks     No current facility-administered medications for this visit.    Allergies Allergies  Allergen Reactions   Adalimumab Swelling   Aspirin Hives    Histories Past Medical History:  Diagnosis Date   Anemia    Anxiety    Blood transfusion without reported diagnosis    Central centrifugal scarring alopecia    Chronic swimmer's ear of right side    Crohn's colitis ([place])    Hypertension    Phreesia 09/10/2020   IDA (iron deficiency anemia)    Impetigo    Postinflammatory hyperpigmentation    Scalp psoriasis    Seborrheic dermatitis of scalp    Past Surgical History:  Procedure Laterality Date   APPENDECTOMY  BOWEL RESECTION     COLONOSCOPY     UPPER GASTROINTESTINAL ENDOSCOPY     Social History   Socioeconomic History   Marital status: Single    Spouse name: Not on file   Number of children: Not on file   Years of education: Not on file   Highest education level: Not on file  Occupational History   Not on file  Tobacco Use    Smoking status: Never    Passive exposure: Never   Smokeless tobacco: Never  Vaping Use   Vaping Use: Never used  Substance and Sexual Activity   Alcohol use: Yes    Comment: socially.    Drug use: Not Currently    Types: Marijuana    Comment: last used 04-17-20 per pt   Sexual activity: Not Currently    Birth control/protection: Implant  Other Topics Concern   Not on file  Social History Narrative   Not on file   Social Determinants of Health   Financial Resource Strain: Not on file  Food Insecurity: Not on file  Transportation Needs: Not on file  Physical Activity: Not on file  Stress: Not on file  Social Connections: Not on file  Intimate Partner Violence: Not on file   Family History  Problem Relation Age of Onset   Crohn's disease Mother    Schizophrenia Father    Bipolar disorder Father    Crohn's disease Maternal Grandmother    Prostate cancer Maternal Uncle    Colon cancer Neg Hx    Stomach cancer Neg Hx    Pancreatic cancer Neg Hx    Esophageal cancer Neg Hx    Inflammatory bowel disease Neg Hx    Liver disease Neg Hx    Rectal cancer Neg Hx    I have reviewed her medical, social, and family history in detail and updated the electronic medical record as necessary.    PHYSICAL EXAMINATION  BP 106/64   Pulse 82   Ht 5' 2"  (1.575 m)   Wt 148 lb (67.1 kg)   BMI 27.07 kg/m  Wt Readings from Last 3 Encounters:  07/01/22 148 lb (67.1 kg)  06/09/22 148 lb (67.1 kg)  05/28/22 154 lb 6.4 oz (70 kg)  GEN: NAD, appears stated age, doesn't appear chronically ill, accompanied by toddler son PSYCH: Cooperative, without pressured speech EYE: Conjunctivae pink, sclerae anicteric ENT: MMM CV: Nontachycardic RESP: No audible wheezing GI: NABS, soft, nontender, no rebound or guarding, surgical scars present from previous surgery MSK/EXT: No lower extremity edema SKIN: Tattoos present; no jaundice NEURO:  Alert & Oriented x 3, no focal deficits   REVIEW OF DATA   I reviewed the following data at the time of this encounter:  GI Procedures and Studies  Previously reviewed  Laboratory Studies  Reviewed those in epic  Imaging Studies  No new relevant studies to review  Outside Records  No new outside records to review   ASSESSMENT  Ms. Rocco is a 31 y.o. female with a pmh significant for Crohn's disease (diagnosed in 2011 and reinitiated on Remicade), status post appendectomy and ileocecectomy, anal fissures, possible psoriasis, MDD/anxiety, iron deficiency anemia, status post 3 pregnancies.  The patient is seen today for evaluation and management of:  1. Crohn's disease of both small and large intestine with other complication ([place])   2. Chronic diarrhea   3. Nausea   4. Anorexia   5. Unintentional weight loss    Patient is hemodynamically stable.  Clinically, she  is experiencing increased amounts of diarrhea.  We had previously felt that she could be at risk of bile salt diarrhea due to her prior surgery and had her on cholestyramine.  Cholestyramine has helped somewhat and she was decreasing her dose because she was getting more constipation issues.  We also initiated her previously on fiber supplementation which helped bulk her stool at that time.  She has not taken any further cholestyramine due to no longer having a prescription and her fiber intake has decreased as well.  She will try to really increase her fiber uptake.  She would like to wait on cholestyramine being restarted.  We had previously said that this was the year that we would plan to reevaluate her GI tract in the setting of her known Crohn's disease to ensure disease activity was not present.  With her increasing symptoms, I have some concern as to whether she could have active Crohn's disease.  We will plan repeat endoscopic evaluation first.  We will consider the role of MR enterography/CT enterography as well.  We will see what her inflammatory markers look like at this time.   And we will also plan at the time of her next infusion to undergo therapeutic drug monitoring with Remicade antibodies and drug levels.  She may have IBS overlap with her IBD.  She is at risk of this.  She is at risk of SIBO as well as EPI.  We will keep these in mind.  She would like to hold on cholestyramine be restarted but we will likely consider that.  The risks and benefits of endoscopic evaluation were discussed with the patient; these include but are not limited to the risk of perforation, infection, bleeding, missed lesions, lack of diagnosis, severe illness requiring hospitalization, as well as anesthesia and sedation related illnesses.  The patient and/or family is agreeable to proceed.  All patient questions were answered to the best of my ability, and the patient agrees to the aforementioned plan of action with follow-up as indicated.   PLAN  Laboratories as outlined below today Continue Remicade 10 mg/kg -Tylenol/Benadryl/Solu-Medrol as per protocol -Standing CBC/CMP/ESR/CRP to be drawn at each infusion Continue oral iron daily Continue IV iron infusions as per hematology Restart Metamucil daily for bulking  Holding on restart of cholestyramine for now but will consider Consider SIBO evaluation in near future Consider EPI evaluation in the near future Patient will need DEXA scan at some point in the next 1 to 2 years Will need Remicade TDM to be drawn a few days before her next Remicade infusion Proceed with scheduling diagnostic endoscopy and surveillance colonoscopy   Orders Placed This Encounter  Procedures   Calprotectin, Fecal   C-reactive protein   Sed Rate (ESR)   Clostridium difficile Toxin B, Qualitative, Real-Time PCR   Pancreatic elastase, fecal   Ambulatory referral to Gastroenterology    New Prescriptions   No medications on file   Modified Medications   No medications on file    Planned Follow Up No follow-ups on file.   Total Time in Face-to-Face  and in Coordination of Care for patient including independent/personal interpretation/review of prior testing, medical history, examination, medication adjustment, communicating results with the patient directly, and documentation with the EHR is 25 minutes.    Justice Britain, MD [facility] Office # [phone]

## 2022-07-03 ENCOUNTER — Telehealth: Payer: Self-pay | Admitting: Gastroenterology

## 2022-07-03 ENCOUNTER — Encounter: Payer: Self-pay | Admitting: Gastroenterology

## 2022-07-03 DIAGNOSIS — R630 Anorexia: Secondary | ICD-10-CM | POA: Insufficient documentation

## 2022-07-03 DIAGNOSIS — R634 Abnormal weight loss: Secondary | ICD-10-CM | POA: Insufficient documentation

## 2022-07-03 DIAGNOSIS — R110 Nausea: Secondary | ICD-10-CM | POA: Insufficient documentation

## 2022-07-03 NOTE — Telephone Encounter (Signed)
PT is calling to get lab results. She needs in depth understanding. Please advise.

## 2022-07-03 NOTE — Telephone Encounter (Signed)
I have explained to the pt that she has some inflammation in the body and the upcoming endo colon will give Dr Rush Landmark more information in order to decide what she needs to do next.  The pt has been advised of the information and verbalized understanding.

## 2022-07-14 ENCOUNTER — Encounter: Payer: Medicaid Other | Admitting: Family Medicine

## 2022-07-22 DIAGNOSIS — Z113 Encounter for screening for infections with a predominantly sexual mode of transmission: Secondary | ICD-10-CM | POA: Diagnosis not present

## 2022-07-22 DIAGNOSIS — Z888 Allergy status to other drugs, medicaments and biological substances status: Secondary | ICD-10-CM | POA: Diagnosis not present

## 2022-07-22 DIAGNOSIS — Z886 Allergy status to analgesic agent status: Secondary | ICD-10-CM | POA: Diagnosis not present

## 2022-07-22 DIAGNOSIS — Z79899 Other long term (current) drug therapy: Secondary | ICD-10-CM | POA: Diagnosis not present

## 2022-07-22 DIAGNOSIS — M7989 Other specified soft tissue disorders: Secondary | ICD-10-CM | POA: Diagnosis not present

## 2022-07-22 DIAGNOSIS — R2241 Localized swelling, mass and lump, right lower limb: Secondary | ICD-10-CM | POA: Diagnosis not present

## 2022-07-22 DIAGNOSIS — R102 Pelvic and perineal pain: Secondary | ICD-10-CM | POA: Diagnosis not present

## 2022-07-22 DIAGNOSIS — M25561 Pain in right knee: Secondary | ICD-10-CM | POA: Diagnosis not present

## 2022-07-22 DIAGNOSIS — M79651 Pain in right thigh: Secondary | ICD-10-CM | POA: Diagnosis not present

## 2022-07-22 DIAGNOSIS — Z791 Long term (current) use of non-steroidal anti-inflammatories (NSAID): Secondary | ICD-10-CM | POA: Diagnosis not present

## 2022-07-23 ENCOUNTER — Other Ambulatory Visit: Payer: Medicaid Other

## 2022-07-23 ENCOUNTER — Ambulatory Visit: Payer: Medicaid Other | Admitting: Internal Medicine

## 2022-07-30 ENCOUNTER — Other Ambulatory Visit: Payer: Self-pay

## 2022-07-30 ENCOUNTER — Encounter: Payer: Self-pay | Admitting: Emergency Medicine

## 2022-07-30 ENCOUNTER — Ambulatory Visit
Admission: EM | Admit: 2022-07-30 | Discharge: 2022-07-30 | Disposition: A | Discharge status: Home / Self Care | Payer: Medicaid Other | Attending: Physician Assistant | Admitting: Physician Assistant

## 2022-07-30 DIAGNOSIS — R0981 Nasal congestion: Secondary | ICD-10-CM | POA: Diagnosis not present

## 2022-07-30 DIAGNOSIS — U071 COVID-19: Secondary | ICD-10-CM | POA: Diagnosis not present

## 2022-07-30 DIAGNOSIS — R051 Acute cough: Secondary | ICD-10-CM | POA: Diagnosis not present

## 2022-07-30 MED ORDER — NIRMATRELVIR/RITONAVIR (PAXLOVID)TABLET: 3.0000 | ORAL_TABLET | Freq: Two times a day (BID) | ORAL | 0 refills | Status: AC

## 2022-07-30 MED ORDER — FLUTICASONE PROPIONATE 50 MCG/ACT NA SUSP: 1.0000 | Freq: Every day | NASAL | 0 refills | Status: DC

## 2022-07-30 MED ORDER — PROMETHAZINE-DM 6.25-15 MG/5ML PO SYRP: 5.0000 mL | ORAL_SOLUTION | Freq: Three times a day (TID) | ORAL | 0 refills | Status: DC | PRN

## 2022-07-30 NOTE — Discharge Instructions (Addendum)
We are treating you for COVID.  Take Paxlovid as prescribed.  Take Promethazine DM up to 3 times a day.  This will make you sleepy do not drive or drink alcohol with taking it.  You can use Flonase for additional congestion relief.  Make sure you rest and drink plenty of fluid.  Obtain a pulse oximeter from the pharmacy.  Monitor your oxygen saturation.  If this goes below 93% you should be evaluated and if below 90% you need to go to the emergency room.  If your symptoms worsen in any way please go to the emergency room.  Follow-up with your primary care if you are not feeling significantly better by first thing next week.

## 2022-07-30 NOTE — ED Provider Notes (Signed)
[name]    [name]: [id number] Arrival date & time: 07/30/22  0814      History   Chief Complaint Chief Complaint  Patient presents with   Cough   Sore Throat    HPI Cellie Dardis is a 31 y.o. female.   Patient presents today with a 3-day history of URI symptoms.  She reports cough, congestion, sore throat, fever, body aches, shortness of breath.  Denies any chest pain, nausea, vomiting, diarrhea.  She did take an at-home COVID test yesterday that was positive.  She has not had COVID in the past.  She has had COVID vaccines.  She denies any known sick contacts.  She has been using Tylenol, DayQuil, NyQuil, without improvement of symptoms.  Denies any recent antibiotic or steroid.  She does have a history of Crohn disease and is on immunomodulating medication to manage this.  Denies additional past medical history including diabetes, malignancy, cardiovascular disease, asthma, COPD, allergies.  She does not smoke cigarettes.  She is having difficulty with the activities.    Past Medical History:  Diagnosis Date   Anemia    Anxiety    Blood transfusion without reported diagnosis    Central centrifugal scarring alopecia    Chronic swimmer's ear of right side    Crohn's colitis ([place])    Hypertension    Phreesia 09/10/2020   IDA (iron deficiency anemia)    Impetigo    Postinflammatory hyperpigmentation    Scalp psoriasis    Seborrheic dermatitis of scalp     Patient Active Problem List   Diagnosis Date Noted   Anorexia 07/03/2022   Nausea 07/03/2022   Unintentional weight loss 07/03/2022   Anxiety disorder 04/03/2021   Bipolar disorder ([place]) 04/03/2021   Hemorrhoids 12/26/2020   Chest pain of uncertain etiology 54/00/8676   Essential hypertension 12/25/2020   History of pre-eclampsia 12/25/2020   ASCUS of cervix with negative high risk HPV 11/27/2020   Iron deficiency anemia 11/14/2020   Fecal urgency 10/16/2020   Nasal sore 10/16/2020   Crohn's disease  of both small and large intestine with other complication ([place]) 19/50/9326   History of iron deficiency anemia 03/08/2020   Infliximab (Remicade) long-term use 03/08/2020   History of immunosuppression therapy 03/08/2020   Generalized abdominal pain 03/08/2020   Bloating symptom 03/08/2020   Chronic diarrhea 03/08/2020   Cyst of ovary 12/21/2016   Crohn's disease ([place]) 11/27/2015    Past Surgical History:  Procedure Laterality Date   APPENDECTOMY     BOWEL RESECTION     COLONOSCOPY     UPPER GASTROINTESTINAL ENDOSCOPY      OB History   No obstetric history on file.      Home Medications    Prior to Admission medications   Medication Sig Start Date End Date Taking? Authorizing Provider  fluticasone (FLONASE) 50 MCG/ACT nasal spray Place 1 spray into both nostrils daily. 07/30/22  Yes Eliazar Olivar, Derry Skill, PA-C  nirmatrelvir/ritonavir EUA (PAXLOVID) 20 x 150 MG & 10 x 100MG TABS Take 3 tablets by mouth 2 (two) times daily for 5 days. Patient GFR is 102. Take nirmatrelvir (150 mg) two tablets twice daily for 5 days and ritonavir (100 mg) one tablet twice daily for 5 days. 07/30/22 08/04/22 Yes Viaan Knippenberg K, PA-C  promethazine-dextromethorphan (PROMETHAZINE-DM) 6.25-15 MG/5ML syrup Take 5 mLs by mouth 3 (three) times daily as needed for cough. 07/30/22  Yes Qiana Landgrebe K, PA-C  inFLIXimab (REMICADE IV) Inject into the vein. Every  8 weeks    [provider]    Family History Family History  Problem Relation Age of Onset   Crohn's disease Mother    Schizophrenia Father    Bipolar disorder Father    Crohn's disease Maternal Grandmother    Prostate cancer Maternal Uncle    Colon cancer Neg Hx    Stomach cancer Neg Hx    Pancreatic cancer Neg Hx    Esophageal cancer Neg Hx    Inflammatory bowel disease Neg Hx    Liver disease Neg Hx    Rectal cancer Neg Hx     Social History Social History   Tobacco Use   Smoking status: Never    Passive exposure: Never   Smokeless  tobacco: Never  Vaping Use   Vaping Use: Never used  Substance Use Topics   Alcohol use: Yes    Comment: socially.    Drug use: Not Currently    Types: Marijuana    Comment: last used 04-17-20 per pt     Allergies   Adalimumab and Aspirin   Review of Systems Review of Systems  Constitutional:  Positive for activity change, appetite change, fatigue and fever.  HENT:  Positive for congestion, ear pain and sore throat. Negative for sinus pressure and sneezing.   Respiratory:  Positive for cough and shortness of breath.   Cardiovascular:  Negative for chest pain.  Gastrointestinal:  Negative for abdominal pain, diarrhea, nausea and vomiting.  Musculoskeletal:  Positive for arthralgias and myalgias.  Neurological:  Positive for headaches. Negative for dizziness and light-headedness.     Physical Exam Triage Vital Signs ED Triage Vitals  Enc Vitals Group     BP 07/30/22 0826 125/85     Pulse Rate 07/30/22 0826 68     Resp 07/30/22 0826 16     Temp 07/30/22 0826 98.1 F (36.7 C)     Temp Source 07/30/22 0826 Oral     SpO2 07/30/22 0826 98 %     Weight --      Height --      Head Circumference --      Peak Flow --      Pain Score 07/30/22 0834 8     Pain Loc --      Pain Edu? --      Excl. in [place]? --    No data found.  Updated Vital Signs BP 125/85 (BP Location: Left Arm)   Pulse 68   Temp 98.1 F (36.7 C) (Oral)   Resp 16   SpO2 98%   Visual Acuity Right Eye Distance:   Left Eye Distance:   Bilateral Distance:    Right Eye Near:   Left Eye Near:    Bilateral Near:     Physical Exam Vitals reviewed.  Constitutional:      General: She is awake. She is not in acute distress.    Appearance: Normal appearance. She is well-developed. She is not ill-appearing.     Comments: Very pleasant female appears stated age in no acute distress sitting comfortably in exam room  HENT:     Head: Normocephalic and atraumatic.     Right Ear: Ear canal and external ear  normal. A middle ear effusion is present. Tympanic membrane is not erythematous or bulging.     Left Ear: Ear canal and external ear normal. A middle ear effusion is present. Tympanic membrane is not erythematous or bulging.     Nose:     Right  Sinus: Maxillary sinus tenderness present. No frontal sinus tenderness.     Left Sinus: Maxillary sinus tenderness present. No frontal sinus tenderness.     Mouth/Throat:     Pharynx: Uvula midline. No oropharyngeal exudate or posterior oropharyngeal erythema.  Cardiovascular:     Rate and Rhythm: Normal rate and regular rhythm.     Heart sounds: Normal heart sounds, S1 normal and S2 normal. No murmur heard. Pulmonary:     Effort: Pulmonary effort is normal.     Breath sounds: Normal breath sounds. No wheezing, rhonchi or rales.     Comments: Clear to auscultation bilaterally Psychiatric:        Behavior: Behavior is cooperative.      UC Treatments / Results  Labs (all labs ordered are listed, but only abnormal results are displayed) Labs Reviewed - No data to display  EKG   Radiology No results found.  Procedures Procedures (including critical care time)  Medications Ordered in UC Medications - No data to display  Initial Impression / Assessment and Plan / UC Course  I have reviewed the triage vital signs and the nursing notes.  Pertinent labs & imaging results that were available during my care of the patient were reviewed by me and considered in my medical decision making (see chart for details).     Patient is well-appearing, afebrile, nontoxic, nontachycardic.  She had an at-home COVID test that was positive.  Given that she is within 5 days of symptom onset and has a history of immunosuppression due to Crohn's disease she is a candidate for antiviral therapy.  She did have a recent CMP available in care everywhere from 07/22/2022 that showed normal creatinine with EGFR of 102.  She does not take any medications that need to be  stopped during this therapy.  She was given promethazine DM for cough with instruction not to drive or drink alcohol with taking this medication.  We will also use Flonase for additional symptom relief.  She is to rest and drink plenty of fluid.  Recommended that she obtain a pulse oximeter and if her oxygen saturation drops below 90% she is to go to the emergency room and if below 93% she should be seen again.  If her symptoms are not improving quickly she needs to return for reevaluation.  If anything worsens she is to go to the ER to which she expressed understanding.  Work excuse note was provided.  Final Clinical Impressions(s) / UC Diagnoses   Final diagnoses:  COVID-19  Acute cough  Nasal congestion     Discharge Instructions      We are treating you for COVID.  Take Paxlovid as prescribed.  Take Promethazine DM up to 3 times a day.  This will make you sleepy do not drive or drink alcohol with taking it.  You can use Flonase for additional congestion relief.  Make sure you rest and drink plenty of fluid.  Obtain a pulse oximeter from the pharmacy.  Monitor your oxygen saturation.  If this goes below 93% you should be evaluated and if below 90% you need to go to the emergency room.  If your symptoms worsen in any way please go to the emergency room.  Follow-up with your primary care if you are not feeling significantly better by first thing next week.     ED Prescriptions     Medication Sig Dispense Auth. Provider   nirmatrelvir/ritonavir EUA (PAXLOVID) 20 x 150 MG & 10 x 100MG TABS  Take 3 tablets by mouth 2 (two) times daily for 5 days. Patient GFR is 102. Take nirmatrelvir (150 mg) two tablets twice daily for 5 days and ritonavir (100 mg) one tablet twice daily for 5 days. 30 tablet Colandra Ohanian K, PA-C   promethazine-dextromethorphan (PROMETHAZINE-DM) 6.25-15 MG/5ML syrup Take 5 mLs by mouth 3 (three) times daily as needed for cough. 118 mL Folashade Gamboa K, PA-C   fluticasone  (FLONASE) 50 MCG/ACT nasal spray Place 1 spray into both nostrils daily. 16 g Clay Menser K, PA-C      PDMP not reviewed this encounter.   Terrilee Croak, PA-C 07/30/22 4098

## 2022-07-30 NOTE — ED Triage Notes (Signed)
Pt here for cough and sore throat with body aches x 3 days; pt took home covid test that was positive yesterday

## 2022-08-03 ENCOUNTER — Telehealth: Payer: Self-pay | Admitting: *Deleted

## 2022-08-03 ENCOUNTER — Other Ambulatory Visit: Payer: Self-pay

## 2022-08-03 ENCOUNTER — Telehealth: Payer: Self-pay

## 2022-08-03 ENCOUNTER — Inpatient Hospital Stay: Payer: Medicaid Other | Attending: Internal Medicine

## 2022-08-03 ENCOUNTER — Inpatient Hospital Stay: Payer: Medicaid Other | Admitting: Internal Medicine

## 2022-08-03 VITALS — BP 120/88 | HR 63 | Temp 98.3°F | Resp 17 | Wt 143.2 lb

## 2022-08-03 DIAGNOSIS — N920 Excessive and frequent menstruation with regular cycle: Secondary | ICD-10-CM | POA: Diagnosis not present

## 2022-08-03 DIAGNOSIS — D500 Iron deficiency anemia secondary to blood loss (chronic): Secondary | ICD-10-CM | POA: Diagnosis not present

## 2022-08-03 DIAGNOSIS — E538 Deficiency of other specified B group vitamins: Secondary | ICD-10-CM | POA: Insufficient documentation

## 2022-08-03 DIAGNOSIS — Z79899 Other long term (current) drug therapy: Secondary | ICD-10-CM | POA: Insufficient documentation

## 2022-08-03 DIAGNOSIS — K5090 Crohn's disease, unspecified, without complications: Secondary | ICD-10-CM | POA: Insufficient documentation

## 2022-08-03 DIAGNOSIS — D539 Nutritional anemia, unspecified: Secondary | ICD-10-CM

## 2022-08-03 LAB — CBC WITH DIFFERENTIAL (CANCER CENTER ONLY)
Abs Immature Granulocytes: 0.02 10*3/uL (ref 0.00–0.07)
Basophils Absolute: 0 10*3/uL (ref 0.0–0.1)
Basophils Relative: 0 %
Eosinophils Absolute: 0.1 10*3/uL (ref 0.0–0.5)
Eosinophils Relative: 1 %
HCT: 41.4 % (ref 36.0–46.0)
Hemoglobin: 13.5 g/dL (ref 12.0–15.0)
Immature Granulocytes: 0 %
Lymphocytes Relative: 36 %
Lymphs Abs: 3.7 10*3/uL (ref 0.7–4.0)
MCH: 28.1 pg (ref 26.0–34.0)
MCHC: 32.6 g/dL (ref 30.0–36.0)
MCV: 86.3 fL (ref 80.0–100.0)
Monocytes Absolute: 0.7 10*3/uL (ref 0.1–1.0)
Monocytes Relative: 7 %
Neutro Abs: 5.8 10*3/uL (ref 1.7–7.7)
Neutrophils Relative %: 56 %
Platelet Count: 265 10*3/uL (ref 150–400)
RBC: 4.8 MIL/uL (ref 3.87–5.11)
RDW: 18.5 % — ABNORMAL HIGH (ref 11.5–15.5)
WBC Count: 10.2 10*3/uL (ref 4.0–10.5)
nRBC: 0 % (ref 0.0–0.2)

## 2022-08-03 LAB — VITAMIN B12: Vitamin B-12: 536 pg/mL (ref 180–914)

## 2022-08-03 LAB — FERRITIN: Ferritin: 26 ng/mL (ref 11–307)

## 2022-08-03 LAB — IRON AND IRON BINDING CAPACITY (CC-WL,HP ONLY)
Iron: 25 ug/dL — ABNORMAL LOW (ref 28–170)
Saturation Ratios: 7 % — ABNORMAL LOW (ref 10.4–31.8)
TIBC: 351 ug/dL (ref 250–450)
UIBC: 326 ug/dL (ref 148–442)

## 2022-08-03 NOTE — Telephone Encounter (Signed)
Per Dr. Julien Nordmann - Please let the patient know that her iron study was low and I will arrange for her to receive iron infusion at the [facility] starting this coming Friday. Attempted to contact patient via phone - no answer and MB full. MyChart message sent.

## 2022-08-03 NOTE — Telephone Encounter (Signed)
I have attempted to call the pt again. There was no answer and her VM is full.

## 2022-08-03 NOTE — Progress Notes (Signed)
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  Last dose was giving in July 2023.  CURRENT THERAPY: Oral iron tablet  INTERVAL HISTORY: Erika Stephens 31 y.o. female returns to the clinic today for follow-up visit.  The patient is feeling much better today with no concerning complaints.  She denied having any current chest pain, shortness of breath, cough or hemoptysis.  She has no nausea, vomiting, diarrhea or constipation.  She has no headache or visual changes.  She denied having any weight loss or night sweats.  She was treated with Feraheme infusion few months ago.  She is here today for evaluation and repeat blood work.  MEDICAL HISTORY: Past Medical History:  Diagnosis Date   Anemia    Anxiety    Blood transfusion without reported diagnosis    Central centrifugal scarring alopecia    Chronic swimmer's ear of right side    Crohn's colitis ([place])    Hypertension    Phreesia 09/10/2020   IDA (iron deficiency anemia)    Impetigo    Postinflammatory hyperpigmentation    Scalp psoriasis    Seborrheic dermatitis of scalp     ALLERGIES:  is allergic to adalimumab and aspirin.  MEDICATIONS:  Current Outpatient Medications  Medication Sig Dispense Refill   fluticasone (FLONASE) 50 MCG/ACT nasal spray Place 1 spray into both nostrils daily. 16 g 0   inFLIXimab (REMICADE IV) Inject into the vein. Every 8 weeks     nirmatrelvir/ritonavir EUA (PAXLOVID) 20 x 150 MG & 10 x 100MG TABS Take 3 tablets by mouth 2 (two) times daily for 5 days. Patient GFR is 102. Take nirmatrelvir (150 mg) two tablets twice daily for 5 days and ritonavir  (100 mg) one tablet twice daily for 5 days. 30 tablet 0   promethazine-dextromethorphan (PROMETHAZINE-DM) 6.25-15 MG/5ML syrup Take 5 mLs by mouth 3 (three) times daily as needed for cough. 118 mL 0   No current facility-administered medications for this visit.    SURGICAL HISTORY:  Past Surgical History:  Procedure Laterality Date   APPENDECTOMY     BOWEL RESECTION     COLONOSCOPY     UPPER GASTROINTESTINAL ENDOSCOPY      REVIEW OF SYSTEMS:  A comprehensive review of systems was negative.   PHYSICAL EXAMINATION: General appearance: alert, cooperative, and no distress Head: Normocephalic, without obvious abnormality, atraumatic Neck: no adenopathy, no JVD, supple, symmetrical, trachea midline, and thyroid not enlarged, symmetric, no tenderness/mass/nodules Lymph nodes: Cervical, supraclavicular, and axillary nodes normal. Resp: clear to auscultation bilaterally Back: symmetric, no curvature. ROM normal. No CVA tenderness. Cardio: regular rate and rhythm, S1, S2 normal, no murmur, click, rub or gallop GI: soft, non-tender; bowel sounds normal; no masses,  no organomegaly Extremities: extremities normal, atraumatic, no cyanosis or edema  ECOG PERFORMANCE STATUS: 1 - Symptomatic but completely ambulatory  Blood pressure 120/88, pulse 63, temperature 98.3 F (36.8 C), temperature source Oral, resp. rate 17, weight 143 lb 4 oz (65 kg), SpO2 98 %.  LABORATORY DATA: Lab Results  Component Value Date   WBC 10.2 08/03/2022   HGB 13.5 08/03/2022   HCT 41.4 08/03/2022   MCV 86.3 08/03/2022  PLT 265 08/03/2022      Chemistry      Component Value Date/Time   NA 139 11/20/2020 1012   K 4.0 11/20/2020 1012   CL 104 11/20/2020 1012   CO2 22 11/20/2020 1012   BUN 13 11/20/2020 1012   CREATININE 0.89 11/20/2020 1012   CREATININE 0.84 11/14/2020 1239      Component Value Date/Time   CALCIUM 9.4 11/20/2020 1012   ALKPHOS 77 11/20/2020 1012   AST 21 11/20/2020 1012   AST 21  11/14/2020 1239   ALT 11 11/20/2020 1012   ALT 15 11/14/2020 1239   BILITOT 0.2 11/20/2020 1012   BILITOT 0.4 11/14/2020 1239       RADIOGRAPHIC STUDIES: No results found.  ASSESSMENT AND PLAN: This is a very pleasant 31 years old African-American female with history of iron deficiency anemia secondary to menorrhagia as well as gastrointestinal blood loss.   The patient also has history of chron's disease and currently on treatment with Remicade.  She has a history of vitamin B12 deficiency. She has been on oral iron tablet with no improvement in her symptoms. She was treated in the past with Feraheme infusion last dose was in July 2023.   The patient is feeling much better today with no concerning complaints. Repeat CBC showed improvement of her hemoglobin to 13.5 and hematocrit 41.4%.  Iron study, ferritin and vitamin B12 levels are still pending. I recommended for the patient to continue on the oral iron tablets for now and I will see her back for follow-up visit in 3 months unless there is significant deficiency of her iron or vitamin B12, we will call the patient with additional recommendation. The patient was advised to call immediately if she has any other concerning symptoms in the interval. The patient voices understanding of current disease status and treatment options and is in agreement with the current care plan.  All questions were answered. The patient knows to call the clinic with any problems, questions or concerns. We can certainly see the patient much sooner if necessary.  Disclaimer: This note was dictated with voice recognition software. Similar sounding words can inadvertently be transcribed and may not be corrected upon review.

## 2022-08-03 NOTE — Telephone Encounter (Signed)
I attempted to call the pt at number detailed. There was no answer and I was unable to LVM because her mailbox is full.

## 2022-08-03 NOTE — Telephone Encounter (Signed)
Duplicate-opened in error. 

## 2022-08-03 NOTE — Telephone Encounter (Signed)
-----   Message from Curt Bears, MD sent at 08/03/2022  9:17 AM EDT ----- Please let the patient know that her iron study was low and I will arrange for her to receive iron infusion at the [facility] starting this coming Friday. ----- Message ----- From: [place]: 08/03/2022   8:21 AM EDT To: Curt Bears, MD

## 2022-08-12 ENCOUNTER — Telehealth: Payer: Self-pay | Admitting: Gastroenterology

## 2022-08-12 ENCOUNTER — Telehealth: Payer: Self-pay

## 2022-08-12 ENCOUNTER — Ambulatory Visit (AMBULATORY_SURGERY_CENTER): Payer: Medicaid Other | Admitting: Gastroenterology

## 2022-08-12 ENCOUNTER — Encounter: Payer: Self-pay | Admitting: Gastroenterology

## 2022-08-12 VITALS — BP 109/56 | HR 66 | Temp 98.6°F | Resp 15 | Ht 62.0 in | Wt 148.0 lb

## 2022-08-12 DIAGNOSIS — R634 Abnormal weight loss: Secondary | ICD-10-CM

## 2022-08-12 DIAGNOSIS — K649 Unspecified hemorrhoids: Secondary | ICD-10-CM | POA: Diagnosis not present

## 2022-08-12 DIAGNOSIS — K50818 Crohn's disease of both small and large intestine with other complication: Secondary | ICD-10-CM | POA: Diagnosis not present

## 2022-08-12 DIAGNOSIS — R110 Nausea: Secondary | ICD-10-CM | POA: Diagnosis not present

## 2022-08-12 DIAGNOSIS — E669 Obesity, unspecified: Secondary | ICD-10-CM | POA: Diagnosis not present

## 2022-08-12 DIAGNOSIS — K229 Disease of esophagus, unspecified: Secondary | ICD-10-CM

## 2022-08-12 DIAGNOSIS — K2950 Unspecified chronic gastritis without bleeding: Secondary | ICD-10-CM

## 2022-08-12 LAB — POCT URINE PREGNANCY: Preg Test, Ur: NEGATIVE

## 2022-08-12 MED ORDER — SODIUM CHLORIDE 0.9 % IV SOLN: 500.0000 mL | Freq: Once | INTRAVENOUS | Status: DC

## 2022-08-12 NOTE — Patient Instructions (Signed)
YOU HAD AN ENDOSCOPIC PROCEDURE TODAY AT [facility]:   Refer to the procedure report that was given to you for any specific questions about what was found during the examination.  If the procedure report does not answer your questions, please call your gastroenterologist to clarify.  If you requested that your care partner not be given the details of your procedure findings, then the procedure report has been included in a sealed envelope for you to review at your convenience later.  YOU SHOULD EXPECT: Some feelings of bloating in the abdomen. Passage of more gas than usual.  Walking can help get rid of the air that was put into your GI tract during the procedure and reduce the bloating. If you had a lower endoscopy (such as a colonoscopy or flexible sigmoidoscopy) you may notice spotting of blood in your stool or on the toilet paper. If you underwent a bowel prep for your procedure, you may not have a normal bowel movement for a few days.  Please Note:  You might notice some irritation and congestion in your nose or some drainage.  This is from the oxygen used during your procedure.  There is no need for concern and it should clear up in a day or so.  SYMPTOMS TO REPORT IMMEDIATELY:  Following lower endoscopy (colonoscopy or flexible sigmoidoscopy):  Excessive amounts of blood in the stool  Significant tenderness or worsening of abdominal pains  Swelling of the abdomen that is new, acute  Fever of 100F or higher  Following upper endoscopy (EGD)  Vomiting of blood or coffee ground material  New chest pain or pain under the shoulder blades  Painful or persistently difficult swallowing  New shortness of breath  Fever of 100F or higher  Black, tarry-looking stools  For urgent or emergent issues, a gastroenterologist can be reached at any hour by calling [phone]. Do not use MyChart messaging for urgent concerns.    DIET:  We do recommend a small meal at first, but  then you may proceed to your regular diet.  Drink plenty of fluids but you should avoid alcoholic beverages for 24 hours.  ACTIVITY:  You should plan to take it easy for the rest of today and you should NOT DRIVE or use heavy machinery until tomorrow (because of the sedation medicines used during the test).    FOLLOW UP: Our staff will call the number listed on your records the next business day following your procedure.  We will call around 7:15- 8:00 am to check on you and address any questions or concerns that you may have regarding the information given to you following your procedure. If we do not reach you, we will leave a message.     If any biopsies were taken you will be contacted by phone or by letter within the next 1-3 weeks.  Please call us at [phone] if you have not heard about the biopsies in 3 weeks.    SIGNATURES/CONFIDENTIALITY: You and/or your care partner have signed paperwork which will be entered into your electronic medical record.  These signatures attest to the fact that that the information above on your After Visit Summary has been reviewed and is understood.  Full responsibility of the confidentiality of this discharge information lies with you and/or your care-partner.

## 2022-08-12 NOTE — Progress Notes (Signed)
Called to room to assist during endoscopic procedure.  Patient ID and intended procedure confirmed with present staff. Received instructions for my participation in the procedure from the performing physician.  

## 2022-08-12 NOTE — Progress Notes (Signed)
GASTROENTEROLOGY PROCEDURE H&P NOTE   Primary Care Physician: [name], MD  HPI: Erika Stephens is a 31 y.o. female who presents for EGD/colonoscopy for surveillance of Crohn's disease as well further diagnostic work-up of as history of chronic diarrhea with nausea and vomiting as well as unintentional weight loss.  Past Medical History:  Diagnosis Date   Anemia    Anxiety    Blood transfusion without reported diagnosis    Central centrifugal scarring alopecia    Chronic swimmer's ear of right side    Crohn's colitis ([place])    Hypertension    [employer] 09/10/2020   IDA (iron deficiency anemia)    Impetigo    Postinflammatory hyperpigmentation    Scalp psoriasis    Seborrheic dermatitis of scalp    Past Surgical History:  Procedure Laterality Date   APPENDECTOMY     BOWEL RESECTION     COLONOSCOPY     UPPER GASTROINTESTINAL ENDOSCOPY     Current Outpatient Medications  Medication Sig Dispense Refill   fluticasone (FLONASE) 50 MCG/ACT nasal spray Place 1 spray into both nostrils daily. 16 g 0   inFLIXimab (REMICADE IV) Inject into the vein. Every 8 weeks     promethazine-dextromethorphan (PROMETHAZINE-DM) 6.25-15 MG/5ML syrup Take 5 mLs by mouth 3 (three) times daily as needed for cough. 118 mL 0   Current Facility-Administered Medications  Medication Dose Route Frequency Provider Last Rate Last Admin   0.9 %  sodium chloride infusion  500 mL Intravenous Once Mansouraty, Telford Nab., MD        Current Outpatient Medications:    fluticasone (FLONASE) 50 MCG/ACT nasal spray, Place 1 spray into both nostrils daily., Disp: 16 g, Rfl: 0   inFLIXimab (REMICADE IV), Inject into the vein. Every 8 weeks, Disp: , Rfl:    promethazine-dextromethorphan (PROMETHAZINE-DM) 6.25-15 MG/5ML syrup, Take 5 mLs by mouth 3 (three) times daily as needed for cough., Disp: 118 mL, Rfl: 0  Current Facility-Administered Medications:    0.9 %  sodium chloride infusion, 500 mL, Intravenous,  Once, Mansouraty, Telford Nab., MD Allergies  Allergen Reactions   Adalimumab Swelling   Aspirin Hives   Family History  Problem Relation Age of Onset   Crohn's disease Mother    Schizophrenia Father    Bipolar disorder Father    Crohn's disease Maternal Grandmother    Prostate cancer Maternal Uncle    Colon cancer Neg Hx    Stomach cancer Neg Hx    Pancreatic cancer Neg Hx    Esophageal cancer Neg Hx    Inflammatory bowel disease Neg Hx    Liver disease Neg Hx    Rectal cancer Neg Hx    Social History   Socioeconomic History   Marital status: Single    Spouse name: Not on file   Number of children: Not on file   Years of education: Not on file   Highest education level: Not on file  Occupational History   Not on file  Tobacco Use   Smoking status: Never    Passive exposure: Never   Smokeless tobacco: Never  Vaping Use   Vaping Use: Never used  Substance and Sexual Activity   Alcohol use: Yes    Comment: socially.    Drug use: Not Currently    Types: Marijuana    Comment: last used 04-17-20 per pt   Sexual activity: Not Currently    Birth control/protection: Implant  Other Topics Concern   Not on file  Social History  Narrative   Not on file   Social Determinants of Health   Financial Resource Strain: Not on file  Food Insecurity: Not on file  Transportation Needs: Not on file  Physical Activity: Not on file  Stress: Not on file  Social Connections: Not on file  Intimate Partner Violence: Not on file    Physical Exam: Today's Vitals   08/12/22 1444 08/12/22 1449  BP: 130/64   Pulse: 87   Temp: 98.6 F (37 C) 98.6 F (37 C)  SpO2: 99%   Weight: 148 lb (67.1 kg)   Height: 5' 2"  (1.575 m)    Body mass index is 27.07 kg/m. GEN: NAD EYE: Sclerae anicteric ENT: MMM CV: Non-tachycardic GI: Soft, NT/ND NEURO:  Alert & Oriented x 3  Lab Results: No results for input(s): "WBC", "HGB", "HCT", "PLT" in the last 72 hours. BMET No results for  input(s): "NA", "K", "CL", "CO2", "GLUCOSE", "BUN", "CREATININE", "CALCIUM" in the last 72 hours. LFT No results for input(s): "PROT", "ALBUMIN", "AST", "ALT", "ALKPHOS", "BILITOT", "BILIDIR", "IBILI" in the last 72 hours. PT/INR No results for input(s): "LABPROT", "INR" in the last 72 hours.   Impression / Plan: This is a 31 y.o.female who presents for EGD/colonoscopy for surveillance of Crohn's disease as well further diagnostic work-up of as history of chronic diarrhea with nausea and vomiting as well as unintentional weight loss.  The risks and benefits of endoscopic evaluation/treatment were discussed with the patient and/or family; these include but are not limited to the risk of perforation, infection, bleeding, missed lesions, lack of diagnosis, severe illness requiring hospitalization, as well as anesthesia and sedation related illnesses.  The patient's history has been reviewed, patient examined, no change in status, and deemed stable for procedure.  The patient and/or family is agreeable to proceed.    Justice Britain, MD [facility] Office # [phone]

## 2022-08-12 NOTE — Progress Notes (Signed)
Report to PACU, RN, vss, BBS= Clear.  

## 2022-08-12 NOTE — Telephone Encounter (Signed)
Pt scheduled for endocolon at 3:30pm today with Dr. Rush Landmark. Pt stated that she vomited all of the second half of prep. Stated that her last bowel movement was light brown liquid, she could not see through to the bottom of the toilet. Advised patient to drink 175m of miralx and 32oz of gatorade starting at 11am. Instructed her to drink 8oz every 15 minutes until finished, and to have clear liquids until 1pm. Pt verbalized understanding and had no further concerns at the end of the call.

## 2022-08-12 NOTE — Op Note (Signed)
[facility] Patient Name: Erika Stephens Procedure Date: 08/12/2022 3:04 PM MRN: [id number] Endoscopist: Justice Britain , MD Age: 31 Referring MD:  Date of Birth: July 15, 1991 Gender: Female Account #: [id number] Procedure:                Colonoscopy Indications:              Follow-up of Crohn's disease of the small bowel and                            colon, Disease activity assessment of Crohn's                            disease of the small bowel and colon, Assess                            therapeutic response to therapy of Crohn's disease                            of the small bowel and colon, Chronic diarrhea,                            Weight loss Medicines:                Monitored Anesthesia Care Procedure:                Pre-Anesthesia Assessment:                           - Prior to the procedure, a History and Physical                            was performed, and patient medications and                            allergies were reviewed. The patient's tolerance of                            previous anesthesia was also reviewed. The risks                            and benefits of the procedure and the sedation                            options and risks were discussed with the patient.                            All questions were answered, and informed consent                            was obtained. Prior Anticoagulants: The patient has                            taken no previous anticoagulant or antiplatelet  agents. ASA Grade Assessment: II - A patient with                            mild systemic disease. After reviewing the risks                            and benefits, the patient was deemed in                            satisfactory condition to undergo the procedure.                           After obtaining informed consent, the colonoscope                            was passed under direct vision. Throughout the                             procedure, the patient's blood pressure, pulse, and                            oxygen saturations were monitored continuously. The                            CF [id number] #[id number] was introduced through the anus                            and advanced to the 10 cm into the ileum. The                            colonoscopy was performed without difficulty. The                            patient tolerated the procedure. The quality of the                            bowel preparation was adequate. The terminal ileum                            was photographed. Scope In: 3:30:43 PM Scope Out: 3:47:20 PM Scope Withdrawal Time: 0 hours 14 minutes 27 seconds  Total Procedure Duration: 0 hours 16 minutes 37 seconds  Findings:                 The digital rectal exam findings include                            hemorrhoids. Pertinent negatives include no                            palpable rectal lesions.                           There was evidence of a prior end-to-side  ileo-colonic anastomosis in the ascending colon.                            This was patent and was characterized by healthy                            appearing mucosa. The anastomosis was traversed.                           The neo-terminal ileum appeared normal. Biopsies                            were taken with a cold forceps for histology.                           Normal mucosa was found in the higher colon.                            Surveillance IBD biopsies were obtained every 10 cm                            randomly. Biopsies were taken with a cold forceps                            for histology from the remaining right colon.                            Biopsies were taken with a cold forceps for                            histology from the left colon. Biopsies were taken                            with a cold forceps for histology from the rectum.                            Non-bleeding non-thrombosed internal hemorrhoids                            were found during retroflexion, during perianal                            exam and during digital exam. The hemorrhoids were                            Grade II (internal hemorrhoids that prolapse but                            reduce spontaneously). Complications:            No immediate complications. Estimated Blood Loss:     Estimated blood loss was minimal. Impression:               - Hemorrhoids found on digital rectal exam.                           -  Patent end-to-side ileo-colonic anastomosis,                            characterized by healthy appearing mucosa.                           - The examined portion of the neoterminal ileum was                            normal. Biopsied.                           - Normal mucosa in the entire examined colon.                            Biopsied for surveillance.                           - Non-bleeding non-thrombosed internal hemorrhoids. Recommendation:           - The patient will be observed post-procedure,                            until all discharge criteria are met.                           - Discharge patient to home.                           - Patient has a contact number available for                            emergencies. The signs and symptoms of potential                            delayed complications were discussed with the                            patient. Return to normal activities tomorrow.                            Written discharge instructions were provided to the                            patient.                           - Resume previous diet.                           - Continue present medications.                           - Await pathology results.                           - Repeat colonoscopy in 2 years for surveillance.                           -  The findings and recommendations were discussed                             with the patient.                           - The findings and recommendations were discussed                            with the patient's family. Justice Britain, MD 08/12/2022 4:01:27 PM

## 2022-08-12 NOTE — Op Note (Signed)
[place] Patient Name: Erika Stephens Procedure Date: 08/12/2022 3:10 PM MRN: [id number] Endoscopist: Justice Britain , MD Age: 31 Referring MD:  Date of Birth: 11-26-1990 Gender: Female Account #: [id number] Procedure:                Upper GI endoscopy Indications:              Crohn's disease, Anorexia, Nausea, Weight loss Medicines:                Monitored Anesthesia Care Procedure:                Pre-Anesthesia Assessment:                           - Prior to the procedure, a History and Physical                            was performed, and patient medications and                            allergies were reviewed. The patient's tolerance of                            previous anesthesia was also reviewed. The risks                            and benefits of the procedure and the sedation                            options and risks were discussed with the patient.                            All questions were answered, and informed consent                            was obtained. Prior Anticoagulants: The patient has                            taken no previous anticoagulant or antiplatelet                            agents. ASA Grade Assessment: II - A patient with                            mild systemic disease. After reviewing the risks                            and benefits, the patient was deemed in                            satisfactory condition to undergo the procedure.                           After obtaining informed consent, the endoscope was  passed under direct vision. Throughout the                            procedure, the patient's blood pressure, pulse, and                            oxygen saturations were monitored continuously. The                            Endoscope was introduced through the mouth, and                            advanced to the second part of duodenum. The upper                            GI  endoscopy was accomplished without difficulty.                            The patient tolerated the procedure. Scope In: Scope Out: Findings:                 No gross lesions were noted in the entire esophagus.                           The Z-line was irregular and was found 38 cm from                            the incisors.                           Patchy mildly erythematous mucosa without bleeding                            was found in the gastric antrum.                           No gross lesions were noted in the entire examined                            stomach. Biopsies were taken with a cold forceps                            for histology and Helicobacter pylori testing.                           No gross lesions were noted in the duodenal bulb,                            in the first portion of the duodenum and in the                            second portion of the duodenum. Biopsies were taken  with a cold forceps for histology. Complications:            No immediate complications. Estimated Blood Loss:     Estimated blood loss was minimal. Impression:               - No gross lesions in esophagus. Z-line irregular,                            38 cm from the incisors.                           - Erythematous mucosa in the antrum. No other gross                            lesions in the stomach. Biopsied.                           - No gross lesions in the duodenal bulb, in the                            first portion of the duodenum and in the second                            portion of the duodenum. Biopsied. Recommendation:           - Proceed to scheduled colonoscopy.                           - Continue present medications.                           - Await pathology results.                           - The findings and recommendations were discussed                            with the patient.                           - The findings and  recommendations were discussed                            with the patient's family. Justice Britain, MD 08/12/2022 3:55:30 PM

## 2022-08-12 NOTE — Telephone Encounter (Signed)
Agree with plan of action. GM

## 2022-08-13 ENCOUNTER — Telehealth: Payer: Self-pay | Admitting: *Deleted

## 2022-08-13 NOTE — Telephone Encounter (Signed)
  Follow up Call-     08/12/2022    2:49 PM 04/19/2020   12:40 PM  Call back number  Post procedure Call Back phone  # [phone]  Permission to leave phone message Yes Yes    First attempt for follow up phone call. No answer at number given.  Left message on voicemail.

## 2022-08-17 ENCOUNTER — Encounter: Payer: Self-pay | Admitting: Gastroenterology

## 2022-08-19 ENCOUNTER — Ambulatory Visit (INDEPENDENT_AMBULATORY_CARE_PROVIDER_SITE_OTHER): Payer: Medicaid Other | Admitting: Family Medicine

## 2022-08-19 ENCOUNTER — Other Ambulatory Visit: Payer: Self-pay

## 2022-08-19 ENCOUNTER — Encounter: Payer: Self-pay | Admitting: Family Medicine

## 2022-08-19 VITALS — BP 114/73 | HR 73 | Temp 97.7°F | Resp 16 | Ht 62.0 in | Wt 147.4 lb

## 2022-08-19 DIAGNOSIS — E538 Deficiency of other specified B group vitamins: Secondary | ICD-10-CM | POA: Diagnosis not present

## 2022-08-19 DIAGNOSIS — K50818 Crohn's disease of both small and large intestine with other complication: Secondary | ICD-10-CM

## 2022-08-19 DIAGNOSIS — Z13220 Encounter for screening for lipoid disorders: Secondary | ICD-10-CM

## 2022-08-19 DIAGNOSIS — Z0000 Encounter for general adult medical examination without abnormal findings: Secondary | ICD-10-CM | POA: Diagnosis not present

## 2022-08-19 DIAGNOSIS — Z130 Encounter for screening for diseases of the blood and blood-forming organs and certain disorders involving the immune mechanism: Secondary | ICD-10-CM

## 2022-08-19 DIAGNOSIS — Z13228 Encounter for screening for other metabolic disorders: Secondary | ICD-10-CM

## 2022-08-19 DIAGNOSIS — Z1329 Encounter for screening for other suspected endocrine disorder: Secondary | ICD-10-CM

## 2022-08-19 MED ORDER — CYANOCOBALAMIN 1000 MCG/ML IJ SOLN
1000.0000 ug | Freq: Once | INTRAMUSCULAR | Status: AC
  Administered 2022-08-19: 1000 ug via INTRAMUSCULAR

## 2022-08-19 NOTE — Progress Notes (Signed)
Established Patient Office Visit  Subjective    Patient ID: Erika Stephens, female    DOB: October 12, 1991  Age: 31 y.o. MRN: [id number]  CC:  Chief Complaint  Patient presents with   Annual Exam    HPI Erika Stephens presents for routine annual exam. Patient reports that she has been on meds for her Crohn's disease.    Outpatient Encounter Medications as of 08/19/2022  Medication Sig   fluticasone (FLONASE) 50 MCG/ACT nasal spray Place 1 spray into both nostrils daily.   inFLIXimab (REMICADE IV) Inject into the vein. Every 8 weeks   promethazine-dextromethorphan (PROMETHAZINE-DM) 6.25-15 MG/5ML syrup Take 5 mLs by mouth 3 (three) times daily as needed for cough.   [EXPIRED] cyanocobalamin (VITAMIN B12) injection 1,000 mcg    No facility-administered encounter medications on file as of 08/19/2022.    Past Medical History:  Diagnosis Date   Anemia    Anxiety    Blood transfusion without reported diagnosis    Central centrifugal scarring alopecia    Chronic swimmer's ear of right side    Crohn's colitis ([facility])    IDA (iron deficiency anemia)    Impetigo    Postinflammatory hyperpigmentation    Pre-eclampsia in postpartum period 01/14/2019   Scalp psoriasis    Seborrheic dermatitis of scalp     Past Surgical History:  Procedure Laterality Date   APPENDECTOMY     BOWEL RESECTION     COLONOSCOPY     UPPER GASTROINTESTINAL ENDOSCOPY      Family History  Problem Relation Age of Onset   Crohn's disease Mother    Schizophrenia Father    Bipolar disorder Father    Crohn's disease Maternal Grandmother    Prostate cancer Maternal Uncle    Colon cancer Neg Hx    Stomach cancer Neg Hx    Pancreatic cancer Neg Hx    Esophageal cancer Neg Hx    Inflammatory bowel disease Neg Hx    Liver disease Neg Hx    Rectal cancer Neg Hx     Social History   Socioeconomic History   Marital status: Single    Spouse name: Not on file   Number of children: Not on file   Years of  education: Not on file   Highest education level: Not on file  Occupational History   Not on file  Tobacco Use   Smoking status: Never    Passive exposure: Never   Smokeless tobacco: Never  Vaping Use   Vaping Use: Never used  Substance and Sexual Activity   Alcohol use: Yes    Comment: socially.    Drug use: Not Currently    Types: Marijuana    Comment: 08-10-22 last used per pt   Sexual activity: Not Currently    Birth control/protection: Condom  Other Topics Concern   Not on file  Social History Narrative   Not on file   Social Determinants of Health   Financial Resource Strain: Not on file  Food Insecurity: Not on file  Transportation Needs: Not on file  Physical Activity: Not on file  Stress: Not on file  Social Connections: Not on file  Intimate Partner Violence: Not on file    Review of Systems  All other systems reviewed and are negative.       Objective    BP 114/73   Pulse 73   Temp 97.7 F (36.5 C) (Oral)   Resp 16   Ht 5' 2"  (1.575 m)   Wt  147 lb 6.4 oz (66.9 kg)   LMP 07/26/2022   SpO2 97%   BMI 26.96 kg/m   Physical Exam Vitals and nursing note reviewed.  Constitutional:      General: She is not in acute distress. HENT:     Head: Normocephalic and atraumatic.     Right Ear: Tympanic membrane, ear canal and external ear normal.     Left Ear: Tympanic membrane, ear canal and external ear normal.     Nose: Nose normal.     Mouth/Throat:     Mouth: Mucous membranes are moist.     Pharynx: Oropharynx is clear.  Eyes:     Conjunctiva/sclera: Conjunctivae normal.     Pupils: Pupils are equal, round, and reactive to light.  Neck:     Thyroid: No thyromegaly.  Cardiovascular:     Rate and Rhythm: Normal rate and regular rhythm.     Heart sounds: Normal heart sounds. No murmur heard. Pulmonary:     Effort: Pulmonary effort is normal. No respiratory distress.     Breath sounds: Normal breath sounds.  Abdominal:     General: There is no  distension.     Palpations: Abdomen is soft. There is no mass.     Tenderness: There is no abdominal tenderness.  Musculoskeletal:        General: Normal range of motion.     Cervical back: Normal range of motion and neck supple.  Skin:    General: Skin is warm and dry.  Neurological:     General: No focal deficit present.     Mental Status: She is alert and oriented to person, place, and time.  Psychiatric:        Mood and Affect: Mood normal.        Behavior: Behavior normal.         Assessment & Plan:   1. Annual physical exam  - CMP14+EGFR  2. Screening for deficiency anemia  - CBC with Differential  3. Screening for lipid disorders  - Lipid Panel  4. Screening for endocrine/metabolic/immunity disorders  - Hemoglobin A1c - TSH - Vitamin B12  5. B12 deficiency  - cyanocobalamin (VITAMIN B12) injection 1,000 mcg  No follow-ups on file.   Becky Sax, MD

## 2022-08-20 ENCOUNTER — Encounter: Payer: Self-pay | Admitting: Family Medicine

## 2022-08-20 LAB — CMP14+EGFR
ALT: 10 IU/L (ref 0–32)
AST: 17 IU/L (ref 0–40)
Albumin/Globulin Ratio: 1.2 (ref 1.2–2.2)
Albumin: 4.1 g/dL (ref 3.9–4.9)
Alkaline Phosphatase: 71 IU/L (ref 44–121)
BUN/Creatinine Ratio: 16 (ref 9–23)
BUN: 12 mg/dL (ref 6–20)
Bilirubin Total: 0.2 mg/dL (ref 0.0–1.2)
CO2: 19 mmol/L — ABNORMAL LOW (ref 20–29)
Calcium: 9.6 mg/dL (ref 8.7–10.2)
Chloride: 105 mmol/L (ref 96–106)
Creatinine, Ser: 0.74 mg/dL (ref 0.57–1.00)
Globulin, Total: 3.5 g/dL (ref 1.5–4.5)
Glucose: 73 mg/dL (ref 70–99)
Potassium: 4 mmol/L (ref 3.5–5.2)
Sodium: 141 mmol/L (ref 134–144)
Total Protein: 7.6 g/dL (ref 6.0–8.5)
eGFR: 110 mL/min/{1.73_m2} (ref >59)

## 2022-08-20 LAB — CBC WITH DIFFERENTIAL/PLATELET
Basophils Absolute: 0 10*3/uL (ref 0.0–0.2)
Basos: 1 %
EOS (ABSOLUTE): 0.1 10*3/uL (ref 0.0–0.4)
Eos: 2 %
Hematocrit: 36.8 % (ref 34.0–46.6)
Hemoglobin: 11.8 g/dL (ref 11.1–15.9)
Immature Grans (Abs): 0 10*3/uL (ref 0.0–0.1)
Immature Granulocytes: 0 %
Lymphocytes Absolute: 2.5 10*3/uL (ref 0.7–3.1)
Lymphs: 35 %
MCH: 28 pg (ref 26.6–33.0)
MCHC: 32.1 g/dL (ref 31.5–35.7)
MCV: 87 fL (ref 79–97)
Monocytes Absolute: 0.5 10*3/uL (ref 0.1–0.9)
Monocytes: 7 %
Neutrophils Absolute: 3.9 10*3/uL (ref 1.4–7.0)
Neutrophils: 55 %
Platelets: 265 10*3/uL (ref 150–450)
RBC: 4.22 x10E6/uL (ref 3.77–5.28)
RDW: 15.6 % — ABNORMAL HIGH (ref 11.7–15.4)
WBC: 7 10*3/uL (ref 3.4–10.8)

## 2022-08-20 LAB — HEMOGLOBIN A1C
Est. average glucose Bld gHb Est-mCnc: 103 mg/dL
Hgb A1c MFr Bld: 5.2 % (ref 4.8–5.6)

## 2022-08-20 LAB — LIPID PANEL
Chol/HDL Ratio: 3.5 ratio (ref 0.0–4.4)
Cholesterol, Total: 177 mg/dL (ref 100–199)
HDL: 51 mg/dL (ref >39)
LDL Chol Calc (NIH): 107 mg/dL — ABNORMAL HIGH (ref 0–99)
Triglycerides: 107 mg/dL (ref 0–149)
VLDL Cholesterol Cal: 19 mg/dL (ref 5–40)

## 2022-08-20 LAB — VITAMIN B12: Vitamin B-12: 558 pg/mL (ref 232–1245)

## 2022-08-20 LAB — TSH: TSH: 0.71 u[IU]/mL (ref 0.450–4.500)

## 2022-08-21 ENCOUNTER — Other Ambulatory Visit (INDEPENDENT_AMBULATORY_CARE_PROVIDER_SITE_OTHER): Payer: Medicaid Other

## 2022-08-21 ENCOUNTER — Non-Acute Institutional Stay (HOSPITAL_COMMUNITY)
Admission: RE | Admit: 2022-08-21 | Discharge: 2022-08-21 | Disposition: A | Discharge status: Home / Self Care | Payer: Medicaid Other | Source: Ambulatory Visit | Attending: Internal Medicine | Admitting: Internal Medicine

## 2022-08-21 DIAGNOSIS — K50818 Crohn's disease of both small and large intestine with other complication: Secondary | ICD-10-CM | POA: Diagnosis not present

## 2022-08-21 LAB — HIGH SENSITIVITY CRP: CRP, High Sensitivity: 1.49 mg/L (ref 0.000–5.000)

## 2022-08-21 LAB — SEDIMENTATION RATE: Sed Rate: 56 mm/hr — ABNORMAL HIGH (ref 0–20)

## 2022-08-21 MED ORDER — ACETAMINOPHEN 325 MG PO TABS
650.0000 mg | ORAL_TABLET | Freq: Once | ORAL | Status: AC
  Administered 2022-08-21: 650 mg via ORAL
  Filled 2022-08-21: qty 2

## 2022-08-21 MED ORDER — SODIUM CHLORIDE 0.9 % IV SOLN
10.0000 mg/kg | INTRAVENOUS | Status: DC
  Administered 2022-08-21: 700 mg via INTRAVENOUS
  Filled 2022-08-21: qty 70

## 2022-08-21 MED ORDER — SODIUM CHLORIDE 0.9 % IV SOLN: INTRAVENOUS | Status: DC | PRN

## 2022-08-21 MED ORDER — DIPHENHYDRAMINE HCL 25 MG PO CAPS
25.0000 mg | ORAL_CAPSULE | Freq: Once | ORAL | Status: AC
  Administered 2022-08-21: 50 mg via ORAL
  Filled 2022-08-21: qty 2

## 2022-08-21 NOTE — Progress Notes (Signed)
PATIENT CARE CENTER NOTE  Diagnosis: Crohn's disease of small and large intestine with other complication Y70.623   Provider: Justice Britain, MD   Procedure: Remicade 700 mg infusion   Note: Patient received Remicade infusion (dose # 4 of 6) via PIV. Pre medicated with Tylenol PO 650 mg and Benadryl PO 50 mg per orders. Tolerated infusion well with no adverse reaction. Vital signs stable. Discharge paper work offered, but pt declined. Pt alert, oriented, and ambulatory at discharge.

## 2022-08-27 ENCOUNTER — Ambulatory Visit (INDEPENDENT_AMBULATORY_CARE_PROVIDER_SITE_OTHER): Payer: Medicaid Other

## 2022-08-27 VITALS — BP 124/77 | HR 69 | Temp 98.2°F | Resp 18 | Ht 62.0 in | Wt 147.0 lb

## 2022-08-27 DIAGNOSIS — D509 Iron deficiency anemia, unspecified: Secondary | ICD-10-CM

## 2022-08-27 DIAGNOSIS — D500 Iron deficiency anemia secondary to blood loss (chronic): Secondary | ICD-10-CM

## 2022-08-27 MED ORDER — SODIUM CHLORIDE 0.9 % IV SOLN
510.0000 mg | Freq: Once | INTRAVENOUS | Status: AC
  Administered 2022-08-27: 510 mg via INTRAVENOUS
  Filled 2022-08-27: qty 17

## 2022-08-27 NOTE — Progress Notes (Signed)
Diagnosis: Iron Deficiency Anemia  Provider:  Marshell Garfinkel MD  Procedure: Infusion  IV Type: Peripheral, IV Location: L Antecubital  Feraheme (Ferumoxytol), Dose: 510 mg  Infusion Start Time: 6808  Infusion Stop Time: 8110  Post Infusion IV Care: Peripheral IV Discontinued  Discharge: Condition: Good, Destination: Home . AVS provided to patient.   Performed by:  Adelina Mings, LPN

## 2022-08-29 LAB — SERIAL MONITORING

## 2022-08-31 ENCOUNTER — Ambulatory Visit: Payer: Medicaid Other

## 2022-08-31 LAB — INFLIXIMAB+AB (SERIAL MONITOR)
Anti-Infliximab Antibody: <22 ng/mL
Infliximab Drug Level: 5.8 ug/mL

## 2022-09-03 ENCOUNTER — Ambulatory Visit (INDEPENDENT_AMBULATORY_CARE_PROVIDER_SITE_OTHER): Payer: Medicaid Other

## 2022-09-03 VITALS — BP 112/71 | HR 76 | Temp 97.8°F | Resp 18 | Ht 62.0 in | Wt 147.2 lb

## 2022-09-03 DIAGNOSIS — N920 Excessive and frequent menstruation with regular cycle: Secondary | ICD-10-CM

## 2022-09-03 DIAGNOSIS — D509 Iron deficiency anemia, unspecified: Secondary | ICD-10-CM | POA: Diagnosis not present

## 2022-09-03 DIAGNOSIS — K922 Gastrointestinal hemorrhage, unspecified: Secondary | ICD-10-CM | POA: Diagnosis not present

## 2022-09-03 DIAGNOSIS — D500 Iron deficiency anemia secondary to blood loss (chronic): Secondary | ICD-10-CM | POA: Diagnosis not present

## 2022-09-03 MED ORDER — DIPHENHYDRAMINE HCL 25 MG PO CAPS: 25.0000 mg | ORAL_CAPSULE | Freq: Once | ORAL | Status: DC

## 2022-09-03 MED ORDER — SODIUM CHLORIDE 0.9 % IV SOLN
510.0000 mg | Freq: Once | INTRAVENOUS | Status: AC
  Administered 2022-09-03: 510 mg via INTRAVENOUS
  Filled 2022-09-03: qty 17

## 2022-09-03 MED ORDER — ACETAMINOPHEN 325 MG PO TABS: 650.0000 mg | ORAL_TABLET | Freq: Once | ORAL | Status: DC

## 2022-09-03 NOTE — Progress Notes (Signed)
Diagnosis: Iron Deficiency Anemia  Provider:  Marshell Garfinkel MD  Procedure: Infusion  IV Type: Peripheral, IV Location: R Forearm  Feraheme (Ferumoxytol), Dose: 510 mg  Infusion Start Time: 5320  Infusion Stop Time: 2334  Post Infusion IV Care: Peripheral IV Discontinued  Discharge: Condition: Good, Destination: Home . AVS provided to patient.   Performed by:  Cleophus Molt, RN

## 2022-09-07 ENCOUNTER — Ambulatory Visit: Payer: Medicaid Other

## 2022-09-13 DIAGNOSIS — H5213 Myopia, bilateral: Secondary | ICD-10-CM | POA: Diagnosis not present

## 2022-09-28 ENCOUNTER — Telehealth: Payer: Self-pay | Admitting: Gastroenterology

## 2022-09-28 DIAGNOSIS — K50818 Crohn's disease of both small and large intestine with other complication: Secondary | ICD-10-CM

## 2022-09-28 NOTE — Telephone Encounter (Signed)
Erika Stephens, Please let the patient know that I reviewed her therapeutic drug monitoring. She still has drug level above 5 which usually suggest good maintenance and in the setting of her biopsies not showing active Crohn's disease I think this is a good sign.  One thing is that she still has an elevated fecal calprotectin and I am not sure why that is the case.  What I would recommend is that we do a CT enterography on her to see if we see any other evidence of potential disease that may tell us why that remains elevated.  No change in her Remicade dosing for now though to get higher drug level we may change the frequency of when she is getting it but I am not sure we need to do that as of yet.  Lets see what the CT enterography shows.  Hopefully she can get that done before the clinic visit that is scheduled. Thanks. GM

## 2022-09-28 NOTE — Telephone Encounter (Signed)
Left message on machine to call back  

## 2022-09-28 NOTE — Telephone Encounter (Signed)
Inbound call from patient stating that she got a letter in the mail about results and is requesting a call back to discuss. Please advise.

## 2022-09-29 NOTE — Telephone Encounter (Signed)
The pt has been advised and agrees to the CT entero.  She will keep appt as planned in the office as well. Order sent to the schedulers

## 2022-09-29 NOTE — Telephone Encounter (Signed)
Left message on machine to call back  

## 2022-10-16 ENCOUNTER — Ambulatory Visit (HOSPITAL_COMMUNITY): Admission: RE | Admit: 2022-10-16 | Payer: Medicaid Other | Source: Ambulatory Visit

## 2022-10-16 ENCOUNTER — Encounter (HOSPITAL_COMMUNITY): Payer: Self-pay

## 2022-10-16 ENCOUNTER — Non-Acute Institutional Stay (HOSPITAL_COMMUNITY)
Admission: RE | Admit: 2022-10-16 | Discharge: 2022-10-16 | Disposition: A | Discharge status: Home / Self Care | Payer: Medicaid Other | Source: Ambulatory Visit | Attending: Internal Medicine | Admitting: Internal Medicine

## 2022-10-16 DIAGNOSIS — K50818 Crohn's disease of both small and large intestine with other complication: Secondary | ICD-10-CM | POA: Diagnosis present

## 2022-10-16 MED ORDER — SODIUM CHLORIDE 0.9 % IV SOLN
700.0000 mg | INTRAVENOUS | Status: DC
  Administered 2022-10-16: 700 mg via INTRAVENOUS
  Filled 2022-10-16: qty 70

## 2022-10-16 MED ORDER — DIPHENHYDRAMINE HCL 25 MG PO CAPS
25.0000 mg | ORAL_CAPSULE | Freq: Once | ORAL | Status: AC
  Administered 2022-10-16: 50 mg via ORAL
  Filled 2022-10-16: qty 2

## 2022-10-16 MED ORDER — SODIUM CHLORIDE 0.9 % IV SOLN: INTRAVENOUS | Status: DC | PRN

## 2022-10-16 MED ORDER — ACETAMINOPHEN 325 MG PO TABS
650.0000 mg | ORAL_TABLET | Freq: Once | ORAL | Status: AC
  Administered 2022-10-16: 650 mg via ORAL
  Filled 2022-10-16: qty 2

## 2022-10-16 NOTE — Final Progress Note (Signed)
PATIENT CARE CENTER NOTE   Diagnosis: Crohn's disease of small and large intestine K50.818   Provider: Justice Britain, MD     Procedure: Remicade 700 mg infusion    Note: Patient received Remicade infusion (dose # 5 of 6) via PIV. Pre medicated with Tylenol PO 650 mg and Benadryl PO 50 mg per orders. Tolerated infusion well with no adverse reaction. Vital signs stable. AVS offered, but patient declined. Alert, oriented, and ambulatory at discharge.

## 2022-10-19 ENCOUNTER — Ambulatory Visit (HOSPITAL_COMMUNITY): Payer: Medicaid Other

## 2022-10-23 ENCOUNTER — Ambulatory Visit: Payer: Medicaid Other | Admitting: Gastroenterology

## 2022-10-26 ENCOUNTER — Ambulatory Visit (HOSPITAL_COMMUNITY): Admission: RE | Admit: 2022-10-26 | Payer: Medicaid Other | Source: Ambulatory Visit

## 2022-11-03 ENCOUNTER — Ambulatory Visit: Payer: Medicaid Other | Admitting: Internal Medicine

## 2022-11-03 ENCOUNTER — Other Ambulatory Visit: Payer: Medicaid Other

## 2022-11-03 ENCOUNTER — Ambulatory Visit (HOSPITAL_COMMUNITY)
Admission: RE | Admit: 2022-11-03 | Discharge: 2022-11-03 | Disposition: A | Discharge status: Home / Self Care | Payer: Medicaid Other | Source: Ambulatory Visit | Attending: Gastroenterology | Admitting: Gastroenterology

## 2022-11-03 DIAGNOSIS — Z9049 Acquired absence of other specified parts of digestive tract: Secondary | ICD-10-CM | POA: Diagnosis not present

## 2022-11-03 DIAGNOSIS — Z9889 Other specified postprocedural states: Secondary | ICD-10-CM | POA: Diagnosis not present

## 2022-11-03 DIAGNOSIS — K5090 Crohn's disease, unspecified, without complications: Secondary | ICD-10-CM | POA: Diagnosis not present

## 2022-11-03 DIAGNOSIS — K50818 Crohn's disease of both small and large intestine with other complication: Secondary | ICD-10-CM | POA: Insufficient documentation

## 2022-11-03 MED ORDER — BARIUM SULFATE 0.1 % PO SUSP
1350.0000 mL | Freq: Once | ORAL | Status: AC
  Administered 2022-11-03: 1350 mL via ORAL

## 2022-11-03 MED ORDER — IOHEXOL 300 MG/ML  SOLN
100.0000 mL | Freq: Once | INTRAMUSCULAR | Status: AC | PRN
  Administered 2022-11-03: 100 mL via INTRAVENOUS

## 2022-11-16 ENCOUNTER — Inpatient Hospital Stay: Payer: Medicaid Other | Admitting: Internal Medicine

## 2022-11-16 ENCOUNTER — Inpatient Hospital Stay: Payer: Medicaid Other | Attending: Internal Medicine

## 2022-11-16 ENCOUNTER — Telehealth: Payer: Self-pay | Admitting: Medical Oncology

## 2022-11-16 NOTE — Telephone Encounter (Signed)
Pt missed appt she said she was with her son at his therapy appt. At 3. She wants to reschedule. Schedule message sent.

## 2022-11-19 ENCOUNTER — Inpatient Hospital Stay: Payer: Medicaid Other | Admitting: Internal Medicine

## 2022-11-19 ENCOUNTER — Inpatient Hospital Stay: Payer: Medicaid Other

## 2022-11-25 ENCOUNTER — Ambulatory Visit
Admission: EM | Admit: 2022-11-25 | Discharge: 2022-11-25 | Disposition: A | Discharge status: Home / Self Care | Payer: Medicaid Other | Attending: Internal Medicine | Admitting: Internal Medicine

## 2022-11-25 DIAGNOSIS — R238 Other skin changes: Secondary | ICD-10-CM | POA: Insufficient documentation

## 2022-11-25 DIAGNOSIS — Z113 Encounter for screening for infections with a predominantly sexual mode of transmission: Secondary | ICD-10-CM | POA: Diagnosis not present

## 2022-11-25 LAB — POCT URINE PREGNANCY: Preg Test, Ur: NEGATIVE

## 2022-11-25 MED ORDER — CLOBETASOL PROPIONATE 0.05 % EX FOAM: Freq: Two times a day (BID) | CUTANEOUS | 0 refills | Status: DC

## 2022-11-25 MED ORDER — DOXYCYCLINE HYCLATE 100 MG PO CAPS: 100.0000 mg | ORAL_CAPSULE | Freq: Two times a day (BID) | ORAL | 0 refills | Status: DC

## 2022-11-25 NOTE — ED Provider Notes (Signed)
[name]    [name]: [id number] Arrival date & time: 11/25/22  1832      History   Chief Complaint Chief Complaint  Patient presents with   problems    HPI Erika Stephens is a 32 y.o. female.   Patient presents with 2 different chief complaints today.  Patient reports scalp irritation and pain that has been intermittent over the past year or so.  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  She has been having intermittent "tension headaches" around the left side of the head ever since she was diagnosed with this.  She reports that she has a new lesion that popped up on the occipital portion of her head that she is concerned about today.  Denies any drainage from the area.  Denies any associated fever.  Patient does not report dizziness, blurred vision, nausea, vomiting.  Patient also presents for routine STD testing.  She reports that she had unprotected sexual intercourse twice over the past few weeks with 1 sexual partner.  Denies any exposure to STD.  She reports that she just completed her menstrual cycle this week and it was malodorous which is unusual for her.  She reports a cloudy vaginal discharge prior to her menstrual cycle but is not sure if this is related to menstrual cycle starting.  Denies any associated dysuria, urinary frequency, abdominal pain, back pain, fever.     Past Medical History:  Diagnosis Date   Anemia    Anxiety    Blood transfusion without reported diagnosis    Central centrifugal scarring alopecia    Chronic swimmer's ear of right side    Crohn's colitis (HCC)    IDA (iron deficiency anemia)    Impetigo    Postinflammatory hyperpigmentation    Pre-eclampsia in postpartum period 01/14/2019   Scalp psoriasis    Seborrheic dermatitis of scalp     Patient Active Problem List   Diagnosis Date Noted   Anorexia 07/03/2022   Nausea 07/03/2022   Unintentional weight loss 07/03/2022   Anxiety disorder 04/03/2021    Bipolar disorder ([place]) 04/03/2021   Hemorrhoids 12/26/2020   Chest pain of uncertain etiology 40/98/1191   Essential hypertension 12/25/2020   History of pre-eclampsia 12/25/2020   ASCUS of cervix with negative high risk HPV 11/27/2020   Iron deficiency anemia 11/14/2020   Fecal urgency 10/16/2020   Nasal sore 10/16/2020   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   Crohn's disease of both small and large intestine with other complication ([place]) 47/82/9562   History of iron deficiency anemia 03/08/2020   Infliximab (Remicade) long-term use 03/08/2020   History of immunosuppression therapy 03/08/2020   Generalized abdominal pain 03/08/2020   Bloating symptom 03/08/2020   Chronic diarrhea 03/08/2020   Pre-eclampsia in postpartum period 01/14/2019   NSVD (normal spontaneous vaginal delivery) 12/28/2018   Cyst of ovary 12/21/2016   Crohn's disease ([place]) 11/27/2015   Anal pain 09/11/2013   Skin rash 09/11/2013   Immunosuppression ([place]) 06/07/2013    Past Surgical History:  Procedure Laterality Date   APPENDECTOMY     BOWEL RESECTION     COLONOSCOPY     UPPER GASTROINTESTINAL ENDOSCOPY      OB History   No obstetric history on file.      Home Medications    Prior to Admission medications   Medication Sig Start Date End Date Taking? Authorizing Provider  clobetasol (OLUX) 0.05 % topical foam Apply topically 2 (two) times  daily. Apply twice daily for up to 2 weeks. 11/25/22  Yes , Hildred Alamin E, FNP  doxycycline (VIBRAMYCIN) 100 MG capsule Take 1 capsule (100 mg total) by mouth 2 (two) times daily. 11/25/22  Yes , Hildred Alamin E, FNP  fluticasone (FLONASE) 50 MCG/ACT nasal spray Place 1 spray into both nostrils daily. 07/30/22   Raspet, Erin K, PA-C  inFLIXimab (REMICADE IV) Inject into the vein. Every 8 weeks    [provider]  promethazine-dextromethorphan (PROMETHAZINE-DM) 6.25-15 MG/5ML syrup Take 5 mLs by mouth 3 (three) times daily as needed for cough. 07/30/22    Raspet, Derry Skill, PA-C    Family History Family History  Problem Relation Age of Onset   Crohn's disease Mother    Schizophrenia Father    Bipolar disorder Father    Crohn's disease Maternal Grandmother    Prostate cancer Maternal Uncle    Colon cancer Neg Hx    Stomach cancer Neg Hx    Pancreatic cancer Neg Hx    Esophageal cancer Neg Hx    Inflammatory bowel disease Neg Hx    Liver disease Neg Hx    Rectal cancer Neg Hx     Social History Social History   Tobacco Use   Smoking status: Never    Passive exposure: Never   Smokeless tobacco: Never  Vaping Use   Vaping Use: Never used  Substance Use Topics   Alcohol use: Yes    Comment: socially.    Drug use: Not Currently    Types: Marijuana    Comment: 08-10-22 last used per pt     Allergies   Adalimumab and Aspirin   Review of Systems Review of Systems Per HPI  Physical Exam Triage Vital Signs ED Triage Vitals [11/25/22 1845]  Enc Vitals Group     BP 136/87     Pulse Rate 96     Resp 16     Temp 98 F (36.7 C)     Temp Source Oral     SpO2 99 %     Weight      Height      Head Circumference      Peak Flow      Pain Score 5     Pain Loc      Pain Edu?      Excl. in [place]?    No data found.  Updated Vital Signs BP 136/87 (BP Location: Right Arm)   Pulse 96   Temp 98 F (36.7 C) (Oral)   Resp 16   SpO2 99%   Visual Acuity Right Eye Distance:   Left Eye Distance:   Bilateral Distance:    Right Eye Near:   Left Eye Near:    Bilateral Near:     Physical Exam Constitutional:      General: She is not in acute distress.    Appearance: Normal appearance. She is not toxic-appearing or diaphoretic.  HENT:     Head: Normocephalic and atraumatic.     Comments: Patient has erythematous mildly raised lesions present to left occipital portion of head with no drainage noted.  No scaly appearance. Eyes:     Extraocular Movements: Extraocular movements intact.     Conjunctiva/sclera: Conjunctivae  normal.     Pupils: Pupils are equal, round, and reactive to light.  Pulmonary:     Effort: Pulmonary effort is normal.  Genitourinary:    Comments: Deferred with shared decision making.  Self swab performed. Neurological:     General:  No focal deficit present.     Mental Status: She is alert and oriented to person, place, and time. Mental status is at baseline.     Cranial Nerves: Cranial nerves 2-12 are intact.     Sensory: Sensation is intact.     Motor: Motor function is intact.     Coordination: Coordination is intact.     Gait: Gait is intact.  Psychiatric:        Mood and Affect: Mood normal.        Behavior: Behavior normal.        Thought Content: Thought content normal.        Judgment: Judgment normal.      UC Treatments / Results  Labs (all labs ordered are listed, but only abnormal results are displayed) Labs Reviewed  RPR  HIV ANTIBODY (ROUTINE TESTING W REFLEX)  POCT URINE PREGNANCY  CERVICOVAGINAL ANCILLARY ONLY    EKG   Radiology No results found.  Procedures Procedures (including critical care time)  Medications Ordered in UC Medications - No data to display  Initial Impression / Assessment and Plan / UC Course  I have reviewed the triage vital signs and the nursing notes.  Pertinent labs & imaging results that were available during my care of the patient were reviewed by me and considered in my medical decision making (see chart for details).     Lesions to posterior scalp differential diagnoses include seborrhic dermatitis versus scalp psoriasis versus infected hair follicles.  Low suspicion for seborrhic dermatitis given appearance on physical exam.  Will treat for both scalp psoriasis and bacterial infection with doxycycline and clobetasol topical foam.  Given persistent symptoms and associated headache, patient was advised to follow-up with PCP as soon as possible for further evaluation and management.  Do not think imaging of the head is  necessary given neuroexam is normal and headaches have been present intermittently for the past year.  although, patient was advised to go to the ER if it symptoms persist or worsen.  Cervicovaginal swab HIV, RPR test pending.  Given no exposure to STD, will await results for any further treatment.  Urine pregnancy test negative.  Patient to refrain from sexual activity until test results and treatment are complete.  Discussed return precautions.  Patient verbalized understanding and was agreeable with plan. Final Clinical Impressions(s) / UC Diagnoses   Final diagnoses:  Screening examination for venereal disease  Scalp irritation     Discharge Instructions      I recommend that you follow-up with primary care doctor for further evaluation and management.  I have prescribed a topical medication that will go in your hair to help treat psoriasis as well as an oral antibiotic that will treat any bacterial infection of the scalp.  Your STD testing is pending.  We will call if it is positive.    ED Prescriptions     Medication Sig Dispense Auth. Provider   clobetasol (OLUX) 0.05 % topical foam Apply topically 2 (two) times daily. Apply twice daily for up to 2 weeks. 50 g Ervin Knack E, [place]   doxycycline (VIBRAMYCIN) 100 MG capsule Take 1 capsule (100 mg total) by mouth 2 (two) times daily. 20 capsule Gustavus Bryant, [place]      PDMP not reviewed this encounter.   Gustavus Bryant, [place] 11/25/22 Ernestina [place]

## 2022-11-25 NOTE — ED Triage Notes (Signed)
Pt c/o:  1) possible plaque psoriasis on back of head causing tension headaches. Onset ~ 1 month ago. Now has a lump forming.   2) sti screening without sxs or known exposure.

## 2022-11-25 NOTE — Discharge Instructions (Addendum)
I recommend that you follow-up with primary care doctor for further evaluation and management.  I have prescribed a topical medication that will go in your hair to help treat psoriasis as well as an oral antibiotic that will treat any bacterial infection of the scalp.  Your STD testing is pending.  We will call if it is positive.

## 2022-11-26 ENCOUNTER — Telehealth (HOSPITAL_COMMUNITY): Payer: Self-pay | Admitting: Emergency Medicine

## 2022-11-26 LAB — CERVICOVAGINAL ANCILLARY ONLY
Bacterial Vaginitis (gardnerella): POSITIVE — AB
Candida Glabrata: NEGATIVE
Candida Vaginitis: NEGATIVE
Chlamydia: POSITIVE — AB
Comment: NEGATIVE
Comment: NEGATIVE
Comment: NEGATIVE
Comment: NEGATIVE
Comment: NEGATIVE
Comment: NORMAL
Neisseria Gonorrhea: NEGATIVE
Trichomonas: NEGATIVE

## 2022-11-26 MED ORDER — METRONIDAZOLE 0.75 % VA GEL: 1.0000 | Freq: Every day | VAGINAL | 0 refills | Status: AC

## 2022-11-27 LAB — HIV ANTIBODY (ROUTINE TESTING W REFLEX): HIV Screen 4th Generation wRfx: NONREACTIVE

## 2022-11-27 LAB — RPR: RPR Ser Ql: NONREACTIVE

## 2022-11-30 ENCOUNTER — Encounter: Payer: Self-pay | Admitting: Internal Medicine

## 2022-12-02 ENCOUNTER — Ambulatory Visit: Payer: Medicaid Other | Admitting: Family Medicine

## 2022-12-07 ENCOUNTER — Encounter: Payer: Medicaid Other | Admitting: Family

## 2022-12-11 ENCOUNTER — Ambulatory Visit: Payer: Medicaid Other | Admitting: Gastroenterology

## 2022-12-11 ENCOUNTER — Encounter: Payer: Self-pay | Admitting: Gastroenterology

## 2022-12-11 VITALS — BP 110/60 | HR 95 | Ht 62.0 in | Wt 148.3 lb

## 2022-12-11 DIAGNOSIS — R21 Rash and other nonspecific skin eruption: Secondary | ICD-10-CM

## 2022-12-11 DIAGNOSIS — K529 Noninfective gastroenteritis and colitis, unspecified: Secondary | ICD-10-CM | POA: Diagnosis not present

## 2022-12-11 DIAGNOSIS — L409 Psoriasis, unspecified: Secondary | ICD-10-CM | POA: Diagnosis not present

## 2022-12-11 DIAGNOSIS — K50818 Crohn's disease of both small and large intestine with other complication: Secondary | ICD-10-CM | POA: Diagnosis not present

## 2022-12-11 NOTE — Patient Instructions (Addendum)
Your provider has requested that you go to the basement level for lab work before leaving today. Press "B" on the elevator. The lab is located at the first door on the left as you exit the elevator.  You have been given a testing kit to check for small intestine bacterial overgrowth (SIBO) which is completed by a company named [employer]. Make sure to return your test in the mail using the return mailing label given to you along with the kit. Your demographic and insurance information have already been sent to the company and they should be in contact with you over the next 1-2 weeks regarding this test. [facility] will collect an upfront charge of $99.74 for commercial insurance plans and $209.74 is you are paying cash. Make sure to discuss with [facility] PRIOR to having the test to see if they have gotten information from your insurance company as to how much your testing will cost out of pocket, if any. Please keep in mind that you will be getting a call from phone number [phone] or a similar number. If you do not hear from them within this time frame, please call our office at [phone] or call [facility] directly at [phone].   Referral has been sent to [facility]. Someone from their office will contact you to schedule an appointment.   Follow up in 6 months. Office will contact you to schedule.   _______________________________________________________  If your blood pressure at your visit was 140/90 or greater, please contact your primary care physician to follow up on this.  _______________________________________________________  If you are age 15 or older, your body mass index should be between 23-30. Your Body mass index is 27.13 kg/m. If this is out of the aforementioned range listed, please consider follow up with your Primary Care Provider.  If you are age 57 or younger, your body mass index should be between 19-25. Your Body mass index  is 27.13 kg/m. If this is out of the aformentioned range listed, please consider follow up with your Primary Care Provider.   ________________________________________________________  The [facility] providers would like to encourage you to use [facility] to communicate with providers for non-urgent requests or questions.  Due to long hold times on the telephone, sending your provider a message by [facility] may be a faster and more efficient way to get a response.  Please allow 48 business hours for a response.  Please remember that this is for non-urgent requests.  _______________________________________________________  Thank you for choosing me and [facility].  Dr. Rush Landmark

## 2022-12-11 NOTE — Progress Notes (Signed)
[place] VISIT   Primary Care Provider [name], [place] [address] [phone]  Patient Profile: Erika Stephens is a 32 y.o. female with a pmh significant for Crohn's disease (diagnosed in 2011 and reinitiated on Remicade), status post appendectomy and ileocecectomy, anal fissures, possible psoriasis, MDD/anxiety, iron deficiency anemia, status post 3 pregnancies.  The patient presents to the [facility] for an evaluation and management of problem(s) noted below:  Problem List No diagnosis found.    History of Present Illness Please see prior notes for full details of HPI  Interval History The patient returns for follow-up.  We have not seen her in clinic in nearly a year.  She had been doing well with IV iron infusions and then stopped doing them and recently was found to have more significant iron deficiency anemia.  She has been restarted on IV iron infusions.  She also states that over the course of the last few months she has been experiencing more issues of diarrhea.  She has stopped taking her cholestyramine which previously had been somewhat helpful.  She had also been taking Metamucil in a way of trying to bulk her stools but has noted without being on it as well that her stools are a little bit more loose than normal.  She has between 2 and 3 bowel movements per day that are soft to watery.  She has not noted any blood in her stools.  She recalls issues with her sinuses being a problem when she was first diagnosed with Crohn's disease.  Her Remicade had usually helped with that.  She is noticing some issues from that standpoint.  She has been experiencing some mild anorexia symptoms as well as nausea at times.  She has had some weight loss which has been slightly unintentional though not severe  BMs per day -between 2 and 3 Nocturnal BMs -infrequent Blood -none Mucous -none Tenesmus  -none Urgency -infrequent though more of an issue than previously Skin Manifestations -no new psoriatic-like manifestations Eye Manifestations -none Joint Manifestations -none  GI Review of Systems Positive as above Negative for dysphagia, odynophagia, nausea, vomiting, pain, melena, hematochezia  Review of Systems General: Denies fevers/chills HEENT: Denies new oral lesions Cardiovascular: Denies chest pain Pulmonary: Denies shortness of breath Gastroenterological: See HPI Genitourinary: Denies darkened urine or hematuria Hematological: Denies easy bruising daily that Dermatological: Denies current skin issues or jaundice Psychological: Mood is stable   Medications Current Outpatient Medications  Medication Sig Dispense Refill   clobetasol (OLUX) 0.05 % topical foam Apply topically 2 (two) times daily. Apply twice daily for up to 2 weeks. 50 g 0   doxycycline (VIBRAMYCIN) 100 MG capsule Take 1 capsule (100 mg total) by mouth 2 (two) times daily. 20 capsule 0   fluticasone (FLONASE) 50 MCG/ACT nasal spray Place 1 spray into both nostrils daily. 16 g 0   inFLIXimab (REMICADE IV) Inject into the vein. Every 8 weeks     No current facility-administered medications for this visit.    Allergies Allergies  Allergen Reactions   Adalimumab Swelling   Aspirin Hives    Histories Past Medical History:  Diagnosis Date   Anemia    Anxiety    Blood transfusion without reported diagnosis    Central centrifugal scarring alopecia    Chronic swimmer's ear of right side    Crohn's colitis (HCC)    IDA (iron deficiency anemia)    Impetigo    Postinflammatory hyperpigmentation  Pre-eclampsia in postpartum period 01/14/2019   Scalp psoriasis    Seborrheic dermatitis of scalp    Past Surgical History:  Procedure Laterality Date   APPENDECTOMY     BOWEL RESECTION     COLONOSCOPY     UPPER GASTROINTESTINAL ENDOSCOPY     Social History   Socioeconomic History   Marital status:  Single    Spouse name: Not on file   Number of children: Not on file   Years of education: Not on file   Highest education level: Not on file  Occupational History   Not on file  Tobacco Use   Smoking status: Never    Passive exposure: Never   Smokeless tobacco: Never  Vaping Use   Vaping Use: Never used  Substance and Sexual Activity   Alcohol use: Yes    Comment: socially.    Drug use: Not Currently    Types: Marijuana    Comment: 08-10-22 last used per pt   Sexual activity: Not Currently    Birth control/protection: Condom  Other Topics Concern   Not on file  Social History Narrative   Not on file   Social Determinants of Health   Financial Resource Strain: Not on file  Food Insecurity: Not on file  Transportation Needs: Not on file  Physical Activity: Not on file  Stress: Not on file  Social Connections: Not on file  Intimate Partner Violence: Not on file   Family History  Problem Relation Age of Onset   Crohn's disease Mother    Schizophrenia Father    Bipolar disorder Father    Crohn's disease Maternal Grandmother    Prostate cancer Maternal Uncle    Colon cancer Neg Hx    Stomach cancer Neg Hx    Pancreatic cancer Neg Hx    Esophageal cancer Neg Hx    Inflammatory bowel disease Neg Hx    Liver disease Neg Hx    Rectal cancer Neg Hx    I have reviewed her medical, social, and family history in detail and updated the electronic medical record as necessary.    PHYSICAL EXAMINATION  BP 110/60   Pulse 95   Ht 5\' 2"  (1.575 m)   Wt 148 lb 5 oz (67.3 kg)   SpO2 98%   BMI 27.13 kg/m  Wt Readings from Last 3 Encounters:  12/11/22 148 lb 5 oz (67.3 kg)  10/16/22 148 lb (67.1 kg)  09/03/22 147 lb 3.2 oz (66.8 kg)  GEN: NAD, appears stated age, doesn't appear chronically ill, accompanied by toddler son PSYCH: Cooperative, without pressured speech EYE: Conjunctivae pink, sclerae anicteric ENT: MMM CV: Nontachycardic RESP: No audible wheezing GI: NABS,  soft, nontender, no rebound or guarding, surgical scars present from previous surgery MSK/EXT: No lower extremity edema SKIN: Tattoos present; no jaundice NEURO:  Alert & Oriented x 3, no focal deficits   REVIEW OF DATA  I reviewed the following data at the time of this encounter:  GI Procedures and Studies  Previously reviewed  Laboratory Studies  Reviewed those in epic  Imaging Studies  No new relevant studies to review  Outside Records  No new outside records to review   ASSESSMENT  Ms. Hewes is a 32 y.o. female with a pmh significant for Crohn's disease (diagnosed in 2011 and reinitiated on Remicade), status post appendectomy and ileocecectomy, anal fissures, possible psoriasis, MDD/anxiety, iron deficiency anemia, status post 3 pregnancies.  The patient is seen today for evaluation and management of:  No  diagnosis found.  Patient is hemodynamically stable.  Clinically, she is experiencing increased amounts of diarrhea.  We had previously felt that she could be at risk of bile salt diarrhea due to her prior surgery and had her on cholestyramine.  Cholestyramine has helped somewhat and she was decreasing her dose because she was getting more constipation issues.  We also initiated her previously on fiber supplementation which helped bulk her stool at that time.  She has not taken any further cholestyramine due to no longer having a prescription and her fiber intake has decreased as well.  She will try to really increase her fiber uptake.  She would like to wait on cholestyramine being restarted.  We had previously said that this was the year that we would plan to reevaluate her GI tract in the setting of her known Crohn's disease to ensure disease activity was not present.  With her increasing symptoms, I have some concern as to whether she could have active Crohn's disease.  We will plan repeat endoscopic evaluation first.  We will consider the role of MR enterography/CT enterography  as well.  We will see what her inflammatory markers look like at this time.  And we will also plan at the time of her next infusion to undergo therapeutic drug monitoring with Remicade antibodies and drug levels.  She may have IBS overlap with her IBD.  She is at risk of this.  She is at risk of SIBO as well as EPI.  We will keep these in mind.  She would like to hold on cholestyramine be restarted but we will likely consider that.  The risks and benefits of endoscopic evaluation were discussed with the patient; these include but are not limited to the risk of perforation, infection, bleeding, missed lesions, lack of diagnosis, severe illness requiring hospitalization, as well as anesthesia and sedation related illnesses.  The patient and/or family is agreeable to proceed.  All patient questions were answered to the best of my ability, and the patient agrees to the aforementioned plan of action with follow-up as indicated.   PLAN  Laboratories as outlined below today Continue Remicade 10 mg/kg -Tylenol/Benadryl/Solu-Medrol as per protocol -Standing CBC/CMP/ESR/CRP to be drawn at each infusion Continue oral iron daily Continue IV iron infusions as per hematology Restart Metamucil daily for bulking  Holding on restart of cholestyramine for now but will consider Consider SIBO evaluation in near future Consider EPI evaluation in the near future Patient will need DEXA scan at some point in the next 1 to 2 years Will need Remicade TDM to be drawn a few days before her next Remicade infusion Proceed with scheduling diagnostic endoscopy and surveillance colonoscopy   No orders of the defined types were placed in this encounter.   New Prescriptions   No medications on file   Modified Medications   No medications on file    Planned Follow Up No follow-ups on file.   Total Time in Face-to-Face and in Coordination of Care for patient including independent/personal interpretation/review of prior  testing, medical history, examination, medication adjustment, communicating results with the patient directly, and documentation with the EHR is 25 minutes.    Justice Britain, MD [facility]oscopy Office # [phone]

## 2022-12-12 ENCOUNTER — Encounter: Payer: Self-pay | Admitting: Gastroenterology

## 2022-12-18 ENCOUNTER — Non-Acute Institutional Stay (HOSPITAL_COMMUNITY)
Admission: RE | Admit: 2022-12-18 | Discharge: 2022-12-18 | Disposition: A | Discharge status: Home / Self Care | Payer: Medicaid Other | Source: Ambulatory Visit | Attending: Internal Medicine | Admitting: Internal Medicine

## 2022-12-18 DIAGNOSIS — K5080 Crohn's disease of both small and large intestine without complications: Secondary | ICD-10-CM | POA: Diagnosis present

## 2022-12-18 MED ORDER — SODIUM CHLORIDE 0.9 % IV SOLN: INTRAVENOUS | Status: DC | PRN

## 2022-12-18 MED ORDER — ACETAMINOPHEN 325 MG PO TABS
650.0000 mg | ORAL_TABLET | Freq: Once | ORAL | Status: AC
  Administered 2022-12-18: 650 mg via ORAL
  Filled 2022-12-18: qty 2

## 2022-12-18 MED ORDER — DIPHENHYDRAMINE HCL 25 MG PO CAPS
50.0000 mg | ORAL_CAPSULE | Freq: Once | ORAL | Status: AC
  Administered 2022-12-18: 50 mg via ORAL
  Filled 2022-12-18: qty 2

## 2022-12-18 MED ORDER — SODIUM CHLORIDE 0.9 % IV SOLN
10.0000 mg/kg | Freq: Once | INTRAVENOUS | Status: AC
  Administered 2022-12-18: 700 mg via INTRAVENOUS
  Filled 2022-12-18: qty 70

## 2022-12-18 NOTE — Progress Notes (Signed)
PATIENT CARE CENTER NOTE  Diagnosis: Crohn's disease of small and large intestine with other complication 0000000    Provider: Justice Britain, MD   Procedure: Remicade infusion 700 mg  Note: Patient received Remicade infusion (#6 of 6) via PIV. Pt pre medicated with Tylenol PO 650 mg and Benadryl PO 50 mg per orders. Pt tolerated infusion with no adverse reaction. Vital signs stable. AVS offered, but pt declined. Pt advised to schedule next appointment at front desk. Pt is alert, oriented, and ambulatory at discharge.

## 2022-12-22 ENCOUNTER — Other Ambulatory Visit: Payer: Self-pay

## 2022-12-22 DIAGNOSIS — K50818 Crohn's disease of both small and large intestine with other complication: Secondary | ICD-10-CM

## 2022-12-24 NOTE — Progress Notes (Deleted)
Erika Stephens OFFICE PROGRESS NOTE  [name], [address] [place] [address]  DIAGNOSIS: 1) Iron deficiency anemia secondary to gastrointestinal secondary to Crohn's disease as well as gynecological blood loss. History of ileocolectomy  2) vitamin B12 deficiency secondary to chronic disease and ileum resection  PRIOR THERAPY: Feraheme infusion on as-needed basis.  Last dose was giving in October 2023   CURRENT THERAPY: Oral iron tablet   INTERVAL HISTORY: Erika Stephens 32 y.o. female returns to clinic today for follow-up visit.  The patient was last seen by Dr. Julien Nordmann on 08/03/2022.  The patient is followed for iron deficiency anemia.  She currently is undergoing treatment with oral iron supplement 1 tablet p.o. daily as well as IV iron as needed her most recent being in October 2023.  She denies any recent fever, chills, night sweats, or unexplained weight loss.  Denies any shortness of breath, cough, chest pain, palpitations, or hemoptysis.  Denies any lightheadedness or syncope.  Fatigue?  She denies any abnormal bleeding or bruising including epistaxis, gingival bleeding, hemoptysis, hematemesis, melena, or hematochezia.  She still continues to have heavy menstrual cycles??  Are you seeing GYN??  Already doing??  She is following closely with GI for infliximab infusions for her Crohn's disease.  She is here today for evaluation repeat blood work and consideration of IV iron infusion  MEDICAL HISTORY: Past Medical History:  Diagnosis Date   Anemia    Anxiety    Blood transfusion without reported diagnosis    Central centrifugal scarring alopecia    Chronic swimmer's ear of right side    Crohn's colitis (HCC)    IDA (iron deficiency anemia)    Impetigo    Postinflammatory hyperpigmentation    Pre-eclampsia in postpartum period 01/14/2019   Scalp psoriasis    Seborrheic dermatitis of scalp     ALLERGIES:  is allergic to adalimumab and  aspirin.  MEDICATIONS:  Current Outpatient Medications  Medication Sig Dispense Refill   clobetasol (OLUX) 0.05 % topical foam Apply topically 2 (two) times daily. Apply twice daily for up to 2 weeks. 50 g 0   doxycycline (VIBRAMYCIN) 100 MG capsule Take 1 capsule (100 mg total) by mouth 2 (two) times daily. 20 capsule 0   fluticasone (FLONASE) 50 MCG/ACT nasal spray Place 1 spray into both nostrils daily. 16 g 0   inFLIXimab (REMICADE IV) Inject into the vein. Every 8 weeks     No current facility-administered medications for this visit.    SURGICAL HISTORY:  Past Surgical History:  Procedure Laterality Date   APPENDECTOMY     BOWEL RESECTION     COLONOSCOPY     UPPER GASTROINTESTINAL ENDOSCOPY      REVIEW OF SYSTEMS:   Review of Systems  Constitutional: Negative for appetite change, chills, fatigue, fever and unexpected weight change.  HENT:   Negative for mouth sores, nosebleeds, sore throat and trouble swallowing.   Eyes: Negative for eye problems and icterus.  Respiratory: Negative for cough, hemoptysis, shortness of breath and wheezing.   Cardiovascular: Negative for chest pain and leg swelling.  Gastrointestinal: Negative for abdominal pain, constipation, diarrhea, nausea and vomiting.  Genitourinary: Negative for bladder incontinence, difficulty urinating, dysuria, frequency and hematuria.   Musculoskeletal: Negative for back pain, gait problem, neck pain and neck stiffness.  Skin: Negative for itching and rash.  Neurological: Negative for dizziness, extremity weakness, gait problem, headaches, light-headedness and seizures.  Hematological: Negative for adenopathy. Does not bruise/bleed easily.  Psychiatric/Behavioral: Negative for confusion, depression and sleep disturbance. The patient is not nervous/anxious.     PHYSICAL EXAMINATION:  There were no vitals taken for this visit.  ECOG PERFORMANCE STATUS: {CHL ONC ECOG [id number]  Physical Exam  Constitutional:  Oriented to person, place, and time and well-developed, well-nourished, and in no distress. No distress.  HENT:  Head: Normocephalic and atraumatic.  Mouth/Throat: Oropharynx is clear and moist. No oropharyngeal exudate.  Eyes: Conjunctivae are normal. Right eye exhibits no discharge. Left eye exhibits no discharge. No scleral icterus.  Neck: Normal range of motion. Neck supple.  Cardiovascular: Normal rate, regular rhythm, normal heart sounds and intact distal pulses.   Pulmonary/Chest: Effort normal and breath sounds normal. No respiratory distress. No wheezes. No rales.  Abdominal: Soft. Bowel sounds are normal. Exhibits no distension and no mass. There is no tenderness.  Musculoskeletal: Normal range of motion. Exhibits no edema.  Lymphadenopathy:    No cervical adenopathy.  Neurological: Alert and oriented to person, place, and time. Exhibits normal muscle tone. Gait normal. Coordination normal.  Skin: Skin is warm and dry. No rash noted. Not diaphoretic. No erythema. No pallor.  Psychiatric: Mood, memory and judgment normal.  Vitals reviewed.  LABORATORY DATA: Lab Results  Component Value Date   WBC 7.0 08/19/2022   HGB 11.8 08/19/2022   HCT 36.8 08/19/2022   MCV 87 08/19/2022   PLT 265 08/19/2022      Chemistry      Component Value Date/Time   NA 141 08/19/2022 1135   K 4.0 08/19/2022 1135   CL 105 08/19/2022 1135   CO2 19 (L) 08/19/2022 1135   BUN 12 08/19/2022 1135   CREATININE 0.74 08/19/2022 1135   CREATININE 0.84 11/14/2020 1239      Component Value Date/Time   CALCIUM 9.6 08/19/2022 1135   ALKPHOS 71 08/19/2022 1135   AST 17 08/19/2022 1135   AST 21 11/14/2020 1239   ALT 10 08/19/2022 1135   ALT 15 11/14/2020 1239   BILITOT 0.2 08/19/2022 1135   BILITOT 0.4 11/14/2020 1239       RADIOGRAPHIC STUDIES:  No results found.   ASSESSMENT/PLAN:  This is a very pleasant 32 years old African-American female with history of iron deficiency anemia secondary  to menorrhagia as well as gastrointestinal blood loss.   The patient also has history of chron's disease and currently on treatment with Remicade.  She has a history of vitamin B12 deficiency.  She has been on oral iron tablet with no improvement in her symptoms. She was treated in the past with Feraheme infusion last dose was in October 2023  Her labs today show ***.   We will arrange for ***   We will see her back for follow-up visit in 3 months unless there is significant deficiency of her iron or vitamin B12, we will call the patient with additional recommendation.   The patient was advised to call immediately if she has any concerning symptoms in the interval. The patient voices understanding of current disease status and treatment options and is in agreement with the current care plan. All questions were answered. The patient knows to call the clinic with any problems, questions or concerns. We can certainly see the patient much sooner if necessary      No orders of the defined types were placed in this encounter.    I spent {CHL ONC TIME VISIT - [id number] counseling the patient face to face. The total time spent in the appointment  was {CHL ONC TIME VISIT - [id number].  Bryker Fletchall L Jermani Eberlein, PA-C 12/24/22

## 2022-12-28 ENCOUNTER — Inpatient Hospital Stay: Payer: Medicaid Other | Admitting: Physician Assistant

## 2022-12-28 ENCOUNTER — Inpatient Hospital Stay: Payer: Medicaid Other

## 2023-01-07 NOTE — Progress Notes (Unsigned)
[facility] OFFICE PROGRESS NOTE  [name], [address] [place] [address]  DIAGNOSIS:  1) Iron deficiency anemia secondary to gastrointestinal as well as gynecological blood loss 2) vitamin B12 deficiency secondary to chronic disease, Crohn's disease and ileum resection  PRIOR THERAPY: Discontinued iron supplements due to intolerance   CURRENT THERAPY: Feraheme infusion on as-needed basis.  Last dose was giving in October 09/03/22  INTERVAL HISTORY: Erika Stephens 32 y.o. female returns to the clinic today for a follow up visit. The patient is followed for iron deficiency anemia. She follows with Dr. Rush Landmark from [facility] for her history of Crohn's disease status post appendectomy and ileocolectomy. She also receives Remicade for her Crohn's disease. She also The patient states that she has menstrual cycles approximately every 4 weeks that last for approximately 7 days. She reports her cycles are still heavy but not as heavy as they had been previously. She states she has not established with a GYN.   Her last iron infusion was on October 2023. She does not take oral iron supplements due to constipation and stomach upset.    Otherwise, she states she had a urine dipstick a few days ago that was suspicious for UTI. However, she states her provider was holistic and did not send in any antibiotics. She recently was treated with doxycycline for STD. She patient denies any other abnormal bleeding or bruising including epistaxis, hematemesis, hemoptysis, easy bruising, or hematochezia/melena. Her fatigue is stable. She denies shortness of breath with exertion/decreased exercise. She is here for evaluation and repeat blood work.   MEDICAL HISTORY: Past Medical History:  Diagnosis Date   Anemia    Anxiety    Blood transfusion without reported diagnosis    Central centrifugal scarring alopecia    Chronic swimmer's ear of right side     Crohn's colitis (HCC)    IDA (iron deficiency anemia)    Impetigo    Postinflammatory hyperpigmentation    Pre-eclampsia in postpartum period 01/14/2019   Scalp psoriasis    Seborrheic dermatitis of scalp     ALLERGIES:  is allergic to adalimumab and aspirin.  MEDICATIONS:  Current Outpatient Medications  Medication Sig Dispense Refill   clobetasol (OLUX) 0.05 % topical foam Apply topically 2 (two) times daily. Apply twice daily for up to 2 weeks. 50 g 0   doxycycline (VIBRAMYCIN) 100 MG capsule Take 1 capsule (100 mg total) by mouth 2 (two) times daily. 20 capsule 0   fluticasone (FLONASE) 50 MCG/ACT nasal spray Place 1 spray into both nostrils daily. 16 g 0   inFLIXimab (REMICADE IV) Inject into the vein. Every 8 weeks     No current facility-administered medications for this visit.    SURGICAL HISTORY:  Past Surgical History:  Procedure Laterality Date   APPENDECTOMY     BOWEL RESECTION     COLONOSCOPY     UPPER GASTROINTESTINAL ENDOSCOPY      REVIEW OF SYSTEMS:   Review of Systems  Constitutional: Positive for stable fatigue. Negative for appetite change, chills,  fever and unexpected weight change.  HENT: Negative for mouth sores, nosebleeds, sore throat and trouble swallowing.   Eyes: Negative for eye problems and icterus.  Respiratory: Negative for cough, hemoptysis, shortness of breath and wheezing.   Cardiovascular: Negative for chest pain and leg swelling.  Gastrointestinal: Negative for abdominal pain, constipation, diarrhea, nausea and vomiting.  Genitourinary: Positive for dysuria. Negative for bladder incontinence, difficulty urinating, frequency and hematuria.  Musculoskeletal: Negative for back pain, gait problem, neck pain and neck stiffness.  Skin: Negative for itching and rash.  Neurological: Negative for dizziness, extremity weakness, gait problem, headaches, light-headedness and seizures.  Hematological: Negative for adenopathy. Does not bruise/bleed  easily.  Psychiatric/Behavioral: Negative for confusion, depression and sleep disturbance. The patient is not nervous/anxious.     PHYSICAL EXAMINATION:  Blood pressure 118/77, pulse 63, temperature 98.5 F (36.9 C), temperature source Oral, resp. rate 13, weight 147 lb 1.6 oz (66.7 kg), SpO2 100 %.  ECOG PERFORMANCE STATUS: 1  Physical Exam  Constitutional: Oriented to person, place, and time and well-developed, well-nourished, and in no distress.  HENT:  Head: Normocephalic and atraumatic.  Mouth/Throat: Oropharynx is clear and moist. No oropharyngeal exudate.  Eyes: Conjunctivae are normal. Right eye exhibits no discharge. Left eye exhibits no discharge. No scleral icterus.  Neck: Normal range of motion. Neck supple.  Cardiovascular: Normal rate, regular rhythm, normal heart sounds and intact distal pulses.   Pulmonary/Chest: Effort normal and breath sounds normal. No respiratory distress. No wheezes. No rales.  Abdominal: Soft. Bowel sounds are normal. Exhibits no distension and no mass. There is no tenderness.  Musculoskeletal: Normal range of motion. Exhibits no edema.  Lymphadenopathy:    No cervical adenopathy.  Neurological: Alert and oriented to person, place, and time. Exhibits normal muscle tone. Gait normal. Coordination normal.  Skin: Skin is warm and dry. No rash noted. Not diaphoretic. No erythema. No pallor.  Psychiatric: Mood, memory and judgment normal.  Vitals reviewed.  LABORATORY DATA: Lab Results  Component Value Date   WBC 8.9 01/11/2023   HGB 13.3 01/11/2023   HCT 40.4 01/11/2023   MCV 90.2 01/11/2023   PLT 279 01/11/2023      Chemistry      Component Value Date/Time   NA 141 08/19/2022 1135   K 4.0 08/19/2022 1135   CL 105 08/19/2022 1135   CO2 19 (L) 08/19/2022 1135   BUN 12 08/19/2022 1135   CREATININE 0.74 08/19/2022 1135   CREATININE 0.84 11/14/2020 1239      Component Value Date/Time   CALCIUM 9.6 08/19/2022 1135   ALKPHOS 71  08/19/2022 1135   AST 17 08/19/2022 1135   AST 21 11/14/2020 1239   ALT 10 08/19/2022 1135   ALT 15 11/14/2020 1239   BILITOT 0.2 08/19/2022 1135   BILITOT 0.4 11/14/2020 1239       RADIOGRAPHIC STUDIES:  No results found.   ASSESSMENT/PLAN:  This is a very pleasant 32 years old African-American female with history of iron deficiency anemia secondary to menorrhagia as well as gastrointestinal blood loss.     The patient also has history of chron's disease and currently on treatment with Remicade. She has a history of vitamin B12 deficiency.   She receives IV iron infusion with fereheme, most recent being on 08/2022. She also takes iron supplements p.o. daily.   The patient had repeat labs today with CBC, iron studies, and ferritin.   Her labs today showed normal CBC. Her iron studies and B12 are still pending.   I will wait for the results of her pending lab studies before determining if she needs iron infusions.  I did encourage the patient to increase her dietary intake of iron rich food or take a multivitamin with iron or prenatal iron as a very small amount of iron supplementation is likely better than none.  Of course if she has any GI upset or intolerance she was advised to  discontinue.  We will call her today or tomorrow with the results of her lab studies.  I will also call her and start her on an antibiotic if her urine dipstick appears consistent with urinary tract infection.  We will see her back for a follow up visit in 4 months for evaluation and repeat blood work.   I strongly encouraged the patient to establish care with a GYN.   The patient was advised to call immediately if she has any concerning symptoms in the interval. The patient voices understanding of current disease status and treatment options and is in agreement with the current care plan. All questions were answered. The patient knows to call the clinic with any problems, questions or concerns. We can  certainly see the patient much sooner if necessary       Orders Placed This Encounter  Procedures   Culture, Urine    Standing Status:   Future    Number of Occurrences:   1    Standing Expiration Date:   01/11/2024   Urinalysis, Complete w Microscopic    Standing Status:   Future    Number of Occurrences:   1    Standing Expiration Date:   01/11/2024   CBC with Differential ([place] Only)    Standing Status:   Future    Standing Expiration Date:   01/11/2024   Ferritin    Standing Status:   Future    Standing Expiration Date:   01/11/2024   Iron and Iron Binding Capacity ([facility] only)    Standing Status:   Future    Standing Expiration Date:   01/11/2024   Vitamin B12    Standing Status:   Future    Standing Expiration Date:   01/11/2024     The total time spent in the appointment was 20-29 minutes   Shigeru Lampert L Monicka Cyran, PA-C 01/11/23

## 2023-01-11 ENCOUNTER — Other Ambulatory Visit: Payer: Self-pay

## 2023-01-11 ENCOUNTER — Inpatient Hospital Stay (HOSPITAL_BASED_OUTPATIENT_CLINIC_OR_DEPARTMENT_OTHER): Payer: Medicaid Other | Admitting: Physician Assistant

## 2023-01-11 ENCOUNTER — Telehealth: Payer: Self-pay | Admitting: Internal Medicine

## 2023-01-11 ENCOUNTER — Inpatient Hospital Stay: Payer: Medicaid Other | Attending: Internal Medicine

## 2023-01-11 ENCOUNTER — Telehealth: Payer: Self-pay | Admitting: Physician Assistant

## 2023-01-11 VITALS — BP 118/77 | HR 63 | Temp 98.5°F | Resp 13 | Wt 147.1 lb

## 2023-01-11 DIAGNOSIS — D500 Iron deficiency anemia secondary to blood loss (chronic): Secondary | ICD-10-CM | POA: Insufficient documentation

## 2023-01-11 DIAGNOSIS — K5090 Crohn's disease, unspecified, without complications: Secondary | ICD-10-CM | POA: Diagnosis not present

## 2023-01-11 DIAGNOSIS — N920 Excessive and frequent menstruation with regular cycle: Secondary | ICD-10-CM | POA: Insufficient documentation

## 2023-01-11 DIAGNOSIS — R300 Dysuria: Secondary | ICD-10-CM | POA: Insufficient documentation

## 2023-01-11 DIAGNOSIS — D509 Iron deficiency anemia, unspecified: Secondary | ICD-10-CM | POA: Diagnosis not present

## 2023-01-11 DIAGNOSIS — E538 Deficiency of other specified B group vitamins: Secondary | ICD-10-CM | POA: Insufficient documentation

## 2023-01-11 DIAGNOSIS — D539 Nutritional anemia, unspecified: Secondary | ICD-10-CM

## 2023-01-11 LAB — CBC WITH DIFFERENTIAL (CANCER CENTER ONLY)
Abs Immature Granulocytes: 0.02 10*3/uL (ref 0.00–0.07)
Basophils Absolute: 0.1 10*3/uL (ref 0.0–0.1)
Basophils Relative: 1 %
Eosinophils Absolute: 0.1 10*3/uL (ref 0.0–0.5)
Eosinophils Relative: 1 %
HCT: 40.4 % (ref 36.0–46.0)
Hemoglobin: 13.3 g/dL (ref 12.0–15.0)
Immature Granulocytes: 0 %
Lymphocytes Relative: 33 %
Lymphs Abs: 3 10*3/uL (ref 0.7–4.0)
MCH: 29.7 pg (ref 26.0–34.0)
MCHC: 32.9 g/dL (ref 30.0–36.0)
MCV: 90.2 fL (ref 80.0–100.0)
Monocytes Absolute: 0.6 10*3/uL (ref 0.1–1.0)
Monocytes Relative: 7 %
Neutro Abs: 5.1 10*3/uL (ref 1.7–7.7)
Neutrophils Relative %: 58 %
Platelet Count: 279 10*3/uL (ref 150–400)
RBC: 4.48 MIL/uL (ref 3.87–5.11)
RDW: 12.5 % (ref 11.5–15.5)
WBC Count: 8.9 10*3/uL (ref 4.0–10.5)
nRBC: 0 % (ref 0.0–0.2)

## 2023-01-11 LAB — URINALYSIS, COMPLETE (UACMP) WITH MICROSCOPIC
Bilirubin Urine: NEGATIVE
Glucose, UA: NEGATIVE mg/dL
Ketones, ur: NEGATIVE mg/dL
Nitrite: NEGATIVE
Protein, ur: NEGATIVE mg/dL
Specific Gravity, Urine: 1.02 (ref 1.005–1.030)
pH: 5 (ref 5.0–8.0)

## 2023-01-11 LAB — FERRITIN: Ferritin: 7 ng/mL — ABNORMAL LOW (ref 11–307)

## 2023-01-11 LAB — IRON AND IRON BINDING CAPACITY (CC-WL,HP ONLY)
Iron: 36 ug/dL (ref 28–170)
Saturation Ratios: 9 % — ABNORMAL LOW (ref 10.4–31.8)
TIBC: 407 ug/dL (ref 250–450)
UIBC: 371 ug/dL (ref 148–442)

## 2023-01-11 LAB — VITAMIN B12: Vitamin B-12: 267 pg/mL (ref 180–914)

## 2023-01-11 NOTE — Telephone Encounter (Signed)
Scheduled per 03/04 scheduled message, patient has been called and notified of upcoming appointments.

## 2023-01-11 NOTE — Telephone Encounter (Signed)
Called the patient to review her labs from today.  Unable to reach her.  I left a message.  I reviewed her labs with Dr. Julien Nordmann.  She does not need a B12 injection at this time although we would encourage her to take a multivitamin with B12 or B12 supplement over-the-counter.  I am waiting for the culture for her urinalysis to see if she has a urinary tract infection but I will go ahead and send Macrobid to her pharmacy since she has some leukocytes.  If she continues to have persistent dysuria or urinary symptoms I encouraged her to follow-up with her PCP.  I do not have all the results of her ferritin, but based on what I have at this time with her iron studies, she may benefit from weekly IV iron infusions since she gets GI upset from over-the-counter iron.  I will send a message my schedulers to administer the first dose next week.  I left a callback number if she has any questions.

## 2023-01-12 LAB — URINE CULTURE: Culture: <10000 — AB

## 2023-01-20 ENCOUNTER — Inpatient Hospital Stay: Payer: Medicaid Other

## 2023-01-20 ENCOUNTER — Encounter: Payer: Self-pay | Admitting: Nurse Practitioner

## 2023-01-20 ENCOUNTER — Other Ambulatory Visit: Payer: Self-pay | Admitting: Nurse Practitioner

## 2023-01-20 ENCOUNTER — Other Ambulatory Visit: Payer: Self-pay

## 2023-01-20 VITALS — BP 109/73 | HR 64 | Resp 16

## 2023-01-20 DIAGNOSIS — D509 Iron deficiency anemia, unspecified: Secondary | ICD-10-CM

## 2023-01-20 DIAGNOSIS — D500 Iron deficiency anemia secondary to blood loss (chronic): Secondary | ICD-10-CM | POA: Diagnosis not present

## 2023-01-20 MED ORDER — ACETAMINOPHEN 325 MG PO TABS
650.0000 mg | ORAL_TABLET | Freq: Once | ORAL | Status: AC
  Administered 2023-01-20: 650 mg via ORAL
  Filled 2023-01-20: qty 2

## 2023-01-20 MED ORDER — SODIUM CHLORIDE 0.9 % IV SOLN: Freq: Once | INTRAVENOUS | Status: AC

## 2023-01-20 MED ORDER — LORATADINE 10 MG PO TABS
10.0000 mg | ORAL_TABLET | Freq: Once | ORAL | Status: AC
  Administered 2023-01-20: 10 mg via ORAL
  Filled 2023-01-20: qty 1

## 2023-01-20 MED ORDER — SODIUM CHLORIDE 0.9 % IV SOLN
510.0000 mg | Freq: Once | INTRAVENOUS | Status: AC
  Administered 2023-01-20: 510 mg via INTRAVENOUS
  Filled 2023-01-20: qty 510

## 2023-01-20 NOTE — Patient Instructions (Signed)

## 2023-01-20 NOTE — Progress Notes (Signed)
Patient declined to stay for 30 minute post infusion observation period. Vitals stable at time of discharge. Declined AVS. Ambulated independently to lobby.

## 2023-01-27 ENCOUNTER — Inpatient Hospital Stay: Payer: Medicaid Other

## 2023-01-29 ENCOUNTER — Inpatient Hospital Stay: Payer: Medicaid Other

## 2023-02-10 ENCOUNTER — Ambulatory Visit
Admission: EM | Admit: 2023-02-10 | Discharge: 2023-02-10 | Disposition: A | Discharge status: Home / Self Care | Payer: Medicaid Other | Attending: Internal Medicine | Admitting: Internal Medicine

## 2023-02-10 DIAGNOSIS — N644 Mastodynia: Secondary | ICD-10-CM | POA: Diagnosis not present

## 2023-02-10 DIAGNOSIS — R300 Dysuria: Secondary | ICD-10-CM | POA: Diagnosis not present

## 2023-02-10 LAB — POCT URINALYSIS DIP (MANUAL ENTRY)
Bilirubin, UA: NEGATIVE
Glucose, UA: NEGATIVE mg/dL
Ketones, POC UA: NEGATIVE mg/dL
Nitrite, UA: NEGATIVE
Protein Ur, POC: NEGATIVE mg/dL
Spec Grav, UA: >=1.03 — AB (ref 1.010–1.025)
Urobilinogen, UA: 0.2 E.U./dL
pH, UA: 6 (ref 5.0–8.0)

## 2023-02-10 MED ORDER — CEPHALEXIN 500 MG PO CAPS: 500.0000 mg | ORAL_CAPSULE | Freq: Two times a day (BID) | ORAL | 0 refills | Status: AC

## 2023-02-10 MED ORDER — METRONIDAZOLE 0.75 % VA GEL: 1.0000 | Freq: Every day | VAGINAL | 0 refills | Status: DC

## 2023-02-10 NOTE — ED Triage Notes (Signed)
Pt c/o   1) right breast pain onset ~ 1 month ago but recently has gotten worse  2) "I'm still dealing with a UTI" recent chlamydia inf reports she completed tx. Current sxs dysuria, vaginal odor clear discharge

## 2023-02-10 NOTE — ED Provider Notes (Signed)
[name]    [name]: [id number] Arrival date & time: 02/10/23  0818      History   Chief Complaint Chief Complaint  Patient presents with   Breast Pain    HPI Erika Stephens is a 32 y.o. female.   Patient presents for evaluation of tenderness to the right breast for 1 month.  Pain is felt underneath the breast, when palpated causes a sharp shooting pain to the nipple, began to notice clear drainage from the nipple today.  Has not attempted treatment.  Having monthly menstruation, last menstrual period around March 14, pregnancy test 1 day ago negative, completed in clinic.  Denies presence of nodule, mass, discoloration to the skin, cracking of the nipples.    Patient presents concerned with persisting dysuria and mild lower abdominal pressure present for 2 to 3 weeks.  Associated clear discharge with mild odor slightly different from baseline.  Symptoms started after treatment of chlamydia with doxycycline, completed all antibiotic.  Denies urinary frequency, urgency, hematuria, flank pain, fevers, vaginal itching.   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   Crohn's disease of both small and large intestine with other complication 0000000   History of iron deficiency anemia 03/08/2020   Infliximab (Remicade) long-term use 03/08/2020   History of immunosuppression therapy 03/08/2020   Generalized abdominal pain 03/08/2020   Bloating symptom 03/08/2020    Chronic diarrhea 03/08/2020   Pre-eclampsia in postpartum period 01/14/2019   NSVD (normal spontaneous vaginal delivery) 12/28/2018   Cyst of ovary 12/21/2016   Crohn's disease 11/27/2015   Anal pain 09/11/2013   Skin rash 09/11/2013   Immunosuppression 06/07/2013    Past Surgical History:  Procedure Laterality Date   APPENDECTOMY     BOWEL RESECTION     COLONOSCOPY     UPPER GASTROINTESTINAL ENDOSCOPY      OB History   No obstetric history on file.      Home Medications    Prior to Admission medications   Medication Sig Start Date End Date Taking? Authorizing Provider  clobetasol (OLUX) 0.05 % topical foam Apply topically 2 (two) times daily. Apply twice daily for up to 2 weeks. 11/25/22   Teodora Medici, FNP  doxycycline (VIBRAMYCIN) 100 MG capsule Take 1 capsule (100 mg total) by mouth 2 (two) times daily. 11/25/22   Teodora Medici, FNP  fluticasone (FLONASE) 50 MCG/ACT nasal spray Place 1 spray into both nostrils daily. 07/30/22   Raspet, Erin K, PA-C  inFLIXimab (REMICADE IV) Inject into the vein. Every 8 weeks    [provider]    Family History Family History  Problem Relation Age of Onset   Crohn's disease Mother    Schizophrenia Father    Bipolar disorder Father    Crohn's disease Maternal Grandmother    Prostate cancer Maternal Uncle    Colon cancer Neg Hx    Stomach cancer Neg Hx    Pancreatic cancer Neg Hx    Esophageal cancer  Neg Hx    Inflammatory bowel disease Neg Hx    Liver disease Neg Hx    Rectal cancer Neg Hx     Social History Social History   Tobacco Use   Smoking status: Never    Passive exposure: Never   Smokeless tobacco: Never  Vaping Use   Vaping Use: Never used  Substance Use Topics   Alcohol use: Yes    Comment: socially.    Drug use: Not Currently    Types: Marijuana    Comment: 08-10-22 last used per pt     Allergies   Adalimumab and Aspirin   Review of Systems Review of Systems Defer to HPI    Physical  Exam Triage Vital Signs ED Triage Vitals  Enc Vitals Group     BP 02/10/23 0849 116/76     Pulse Rate 02/10/23 0849 66     Resp 02/10/23 0849 16     Temp 02/10/23 0849 98.2 F (36.8 C)     Temp Source 02/10/23 0849 Oral     SpO2 02/10/23 0849 98 %     Weight --      Height --      Head Circumference --      Peak Flow --      Pain Score 02/10/23 0848 3     Pain Loc --      Pain Edu? --      Excl. in [place]? --    No data found.  Updated Vital Signs BP 116/76 (BP Location: Right Arm)   Pulse 66   Temp 98.2 F (36.8 C) (Oral)   Resp 16   SpO2 98%   Visual Acuity Right Eye Distance:   Left Eye Distance:   Bilateral Distance:    Right Eye Near:   Left Eye Near:    Bilateral Near:     Physical Exam Constitutional:      Appearance: Normal appearance.  Eyes:     Extraocular Movements: Extraocular movements intact.  Pulmonary:     Effort: Pulmonary effort is normal.  Chest:     Comments: Tenderness is present from approximately 4-6 o'clock on the right breast, approximately 0.5 cm nodule present at approximately 6:00, fluctuant, clear drainage expelled from the right nipple without cracking, discoloration, inversion Abdominal:     General: Abdomen is flat. Bowel sounds are normal.     Palpations: Abdomen is soft.     Tenderness: There is abdominal tenderness in the suprapubic area.  Genitourinary:    Comments: deferred Neurological:     Mental Status: She is alert and oriented to person, place, and time. Mental status is at baseline.      UC Treatments / Results  Labs (all labs ordered are listed, but only abnormal results are displayed) Labs Reviewed  POCT URINALYSIS DIP (MANUAL ENTRY) - Abnormal; Notable for the following components:      Result Value   Clarity, UA cloudy (*)    Spec Grav, UA >=1.030 (*)    Blood, UA moderate (*)    Leukocytes, UA Trace (*)    All other components within normal limits  CERVICOVAGINAL ANCILLARY ONLY     EKG   Radiology No results found.  Procedures Procedures (including critical care time)  Medications Ordered in UC Medications - No data to display  Initial Impression / Assessment and Plan / UC Course  I have reviewed the triage vital signs and the nursing notes.  Pertinent labs & imaging results that were  available during my care of the patient were reviewed by me and considered in my medical decision making (see chart for details).  Breast tenderness in female, dysuria  Tenderness is present on exam with small nodule, most likely fibrotic however as she is having clear drainage to the nipple, referral for diagnostic mammogram placed as well as prescribed cephalexin to prophylactically treat infection, recommended over-the-counter analgesics and warm compresses for management of discomfort  Symptomology is consistent with bacterial vaginosis, discussed with patient, treating prophylactically with MetroGel, urinalysis showing trace leukocytes but negative for nitrates, sent for culture, STI screening is pending, will treat further per protocol, advised abstinence until all lab results, treatment is complete and symptoms have resolved, may follow-up with his urgent care as needed Final Clinical Impressions(s) / UC Diagnoses   Final diagnoses:  None   Discharge Instructions   None    ED Prescriptions   None    PDMP not reviewed this encounter.   Hans Eden, NP 02/10/23 1013

## 2023-02-10 NOTE — Discharge Instructions (Signed)
For your dysuria -Symptoms are consistent with bacterial vaginosis and therefore you will be treated prophylactically -Urinalysis showed a very small amount of Erika Stephens blood cells but did not show any bacteria and therefore has been sent to the lab to determine if bacteria will grow, if this occurs you will be notified and antibiotics sent to pharmacy -Begin MetroGel every night before bed for 7 days -STI screening is pending, you will be treated further per protocol, you will be notified of any positive test results -Please refrain from having sex until all lab results, treatment is complete and symptoms have resolved  For your breast -Tenderness is present on exam, small firmness felt in the area of concern, most likely fibrotic tissue -You may use ibuprofen and or Tylenol as well as warm compresses to help manage pain -Referral has been placed for you to receive ultrasound imaging at the breast center, they will reach out to you to schedule appointment, if you have not heard anything in 1 week please call to get appointment, information is listed on front page

## 2023-02-11 ENCOUNTER — Other Ambulatory Visit: Payer: Self-pay | Admitting: Emergency Medicine

## 2023-02-11 DIAGNOSIS — N644 Mastodynia: Secondary | ICD-10-CM

## 2023-02-11 LAB — URINE CULTURE: Culture: NO GROWTH

## 2023-02-11 LAB — CERVICOVAGINAL ANCILLARY ONLY
Bacterial Vaginitis (gardnerella): POSITIVE — AB
Candida Glabrata: NEGATIVE
Candida Vaginitis: NEGATIVE
Chlamydia: NEGATIVE
Comment: NEGATIVE
Comment: NEGATIVE
Comment: NEGATIVE
Comment: NEGATIVE
Comment: NEGATIVE
Comment: NORMAL
Neisseria Gonorrhea: NEGATIVE
Trichomonas: NEGATIVE

## 2023-02-16 ENCOUNTER — Other Ambulatory Visit: Payer: Self-pay

## 2023-02-16 DIAGNOSIS — K50818 Crohn's disease of both small and large intestine with other complication: Secondary | ICD-10-CM

## 2023-02-17 ENCOUNTER — Non-Acute Institutional Stay (HOSPITAL_COMMUNITY)
Admission: RE | Admit: 2023-02-17 | Discharge: 2023-02-17 | Disposition: A | Discharge status: Home / Self Care | Payer: Medicaid Other | Source: Ambulatory Visit | Attending: Internal Medicine | Admitting: Internal Medicine

## 2023-02-17 DIAGNOSIS — K50818 Crohn's disease of both small and large intestine with other complication: Secondary | ICD-10-CM | POA: Diagnosis not present

## 2023-02-17 MED ORDER — SODIUM CHLORIDE 0.9 % IV SOLN
10.0000 mg/kg | INTRAVENOUS | Status: DC
  Administered 2023-02-17: 700 mg via INTRAVENOUS
  Filled 2023-02-17: qty 70

## 2023-02-17 MED ORDER — ACETAMINOPHEN 325 MG PO TABS
650.0000 mg | ORAL_TABLET | ORAL | Status: DC
  Administered 2023-02-17: 650 mg via ORAL
  Filled 2023-02-17: qty 2

## 2023-02-17 MED ORDER — SODIUM CHLORIDE 0.9 % IV SOLN: INTRAVENOUS | Status: DC | PRN

## 2023-02-17 MED ORDER — DIPHENHYDRAMINE HCL 25 MG PO CAPS
50.0000 mg | ORAL_CAPSULE | ORAL | Status: DC
  Administered 2023-02-17: 50 mg via ORAL
  Filled 2023-02-17: qty 2

## 2023-02-17 NOTE — Progress Notes (Signed)
PATIENT CARE CENTER NOTE   Diagnosis:  Crohn's disease of small and large intestine with other complication K50.818   Provider: Corliss Parish MD     Procedure: Remicade infusion 700mg      Note: Patient received Remicade infusion ( dose #1 of 6) via PIV. Pt premedicated with 650mg  tylenol PO and  50mg  benadryl PO. Infusion titrated per protocol. Patient tolerated well with no adverse reaction. Vital signs stable. Pt declined AVS. Patient to come back every 8 weeks for infusion, instructed to schedule appointment at front desk prior to leaving, pt verbalized understanding. Pt provided with work excuse note. Alert, oriented and ambulatory at discharge.

## 2023-02-23 ENCOUNTER — Telehealth: Payer: Self-pay | Admitting: Internal Medicine

## 2023-03-05 ENCOUNTER — Ambulatory Visit
Admission: EM | Admit: 2023-03-05 | Discharge: 2023-03-05 | Disposition: A | Discharge status: Home / Self Care | Payer: Medicaid Other | Attending: Family Medicine | Admitting: Family Medicine

## 2023-03-05 DIAGNOSIS — K5090 Crohn's disease, unspecified, without complications: Secondary | ICD-10-CM | POA: Insufficient documentation

## 2023-03-05 DIAGNOSIS — J069 Acute upper respiratory infection, unspecified: Secondary | ICD-10-CM | POA: Diagnosis not present

## 2023-03-05 DIAGNOSIS — R509 Fever, unspecified: Secondary | ICD-10-CM | POA: Insufficient documentation

## 2023-03-05 DIAGNOSIS — Z1152 Encounter for screening for COVID-19: Secondary | ICD-10-CM | POA: Insufficient documentation

## 2023-03-05 DIAGNOSIS — R300 Dysuria: Secondary | ICD-10-CM | POA: Insufficient documentation

## 2023-03-05 LAB — POCT URINALYSIS DIP (MANUAL ENTRY)
Bilirubin, UA: NEGATIVE
Glucose, UA: NEGATIVE mg/dL
Leukocytes, UA: NEGATIVE
Nitrite, UA: NEGATIVE
Protein Ur, POC: 100 mg/dL — AB
Spec Grav, UA: >=1.03 — AB (ref 1.010–1.025)
Urobilinogen, UA: 0.2 E.U./dL
pH, UA: 5.5 (ref 5.0–8.0)

## 2023-03-05 LAB — POCT INFLUENZA A/B
Influenza A, POC: NEGATIVE
Influenza B, POC: NEGATIVE

## 2023-03-05 LAB — POCT URINE PREGNANCY: Preg Test, Ur: NEGATIVE

## 2023-03-05 MED ORDER — BENZONATATE 100 MG PO CAPS: 100.0000 mg | ORAL_CAPSULE | Freq: Three times a day (TID) | ORAL | 0 refills | Status: DC | PRN

## 2023-03-05 NOTE — ED Provider Notes (Signed)
[name]    [name]: [id number] Arrival date & time: 03/05/23  1229      History   Chief Complaint Chief Complaint  Patient presents with   Fever   Nasal Congestion    HPI Erika Stephens is a 32 y.o. female.    Fever  Here for fever and nasal congestion and cough.  She is also had some scratchy throat.  She has had some headache and myalgia.  No nausea or vomiting.  She has Crohn's, but no increase in her diarrhea.  She had some back pain but has not had any dysuria or urinary frequency.  Last menstrual cycle was maybe in March.  She has possibly been exposed to the flu or COVID at work.  Temperature was between 99-100 yesterday  Past Medical History:  Diagnosis Date   Anemia    Anxiety    Blood transfusion without reported diagnosis    Central centrifugal scarring alopecia    Chronic swimmer's ear of right side    Crohn's colitis (HCC)    IDA (iron deficiency anemia)    Impetigo    Postinflammatory hyperpigmentation    Pre-eclampsia in postpartum period 01/14/2019   Scalp psoriasis    Seborrheic dermatitis of scalp     Patient Active Problem List   Diagnosis Date Noted   Dysuria 01/11/2023   Anorexia 07/03/2022   Nausea 07/03/2022   Unintentional weight loss 07/03/2022   Anxiety disorder 04/03/2021   Bipolar disorder (HCC) 04/03/2021   Hemorrhoids 12/26/2020   Chest pain of uncertain etiology 12/25/2020   Essential hypertension 12/25/2020   History of pre-eclampsia 12/25/2020   ASCUS of cervix with negative high risk HPV 11/27/2020   Iron deficiency anemia 11/14/2020   Fecal urgency 10/16/2020   Nasal sore 10/16/2020   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   Crohn's disease of both small and large intestine with other complication (HCC) 03/08/2020   History of iron deficiency anemia 03/08/2020   Infliximab (Remicade) long-term use 03/08/2020   History of immunosuppression therapy 03/08/2020   Generalized abdominal pain 03/08/2020    Bloating symptom 03/08/2020   Chronic diarrhea 03/08/2020   Pre-eclampsia in postpartum period 01/14/2019   NSVD (normal spontaneous vaginal delivery) 12/28/2018   Cyst of ovary 12/21/2016   Crohn's disease (HCC) 11/27/2015   Anal pain 09/11/2013   Skin rash 09/11/2013   Immunosuppression (HCC) 06/07/2013    Past Surgical History:  Procedure Laterality Date   APPENDECTOMY     BOWEL RESECTION     COLONOSCOPY     UPPER GASTROINTESTINAL ENDOSCOPY      OB History   No obstetric history on file.      Home Medications    Prior to Admission medications   Medication Sig Start Date End Date Taking? Authorizing Provider  benzonatate (TESSALON) 100 MG capsule Take 1 capsule (100 mg total) by mouth 3 (three) times daily as needed for cough. 03/05/23  Yes Zenia Resides, MD  clobetasol (OLUX) 0.05 % topical foam Apply topically 2 (two) times daily. Apply twice daily for up to 2 weeks. 11/25/22   Gustavus Bryant, FNP  fluticasone (FLONASE) 50 MCG/ACT nasal spray Place 1 spray into both nostrils daily. 07/30/22   Raspet, Erin K, PA-C  inFLIXimab (REMICADE IV) Inject into the vein. Every 8 weeks    [provider]    Family History Family History  Problem Relation Age of Onset   Crohn's disease Mother    Schizophrenia Father  Bipolar disorder Father    Crohn's disease Maternal Grandmother    Prostate cancer Maternal Uncle    Colon cancer Neg Hx    Stomach cancer Neg Hx    Pancreatic cancer Neg Hx    Esophageal cancer Neg Hx    Inflammatory bowel disease Neg Hx    Liver disease Neg Hx    Rectal cancer Neg Hx     Social History Social History   Tobacco Use   Smoking status: Never    Passive exposure: Never   Smokeless tobacco: Never  Vaping Use   Vaping Use: Never used  Substance Use Topics   Alcohol use: Yes    Comment: socially.    Drug use: Not Currently    Types: Marijuana    Comment: 08-10-22 last used per pt     Allergies   Adalimumab and  Aspirin   Review of Systems Review of Systems  Constitutional:  Positive for fever.     Physical Exam Triage Vital Signs ED Triage Vitals [03/05/23 1345]  Enc Vitals Group     BP 125/81     Pulse Rate 96     Resp 18     Temp 97.9 F (36.6 C)     Temp Source Oral     SpO2 98 %     Weight      Height      Head Circumference      Peak Flow      Pain Score 4     Pain Loc      Pain Edu?      Excl. in [place]?    No data found.  Updated Vital Signs BP 125/81 (BP Location: Left Arm)   Pulse 96   Temp 97.9 F (36.6 C) (Oral)   Resp 18   LMP 01/24/2023 (Approximate)   SpO2 98%   Visual Acuity Right Eye Distance:   Left Eye Distance:   Bilateral Distance:    Right Eye Near:   Left Eye Near:    Bilateral Near:     Physical Exam Vitals reviewed.  Constitutional:      General: She is not in acute distress.    Appearance: She is not toxic-appearing.  HENT:     Right Ear: Tympanic membrane and ear canal normal.     Left Ear: Tympanic membrane and ear canal normal.     Nose: Congestion present.     Mouth/Throat:     Mouth: Mucous membranes are moist.     Comments: There is mild erythema of the posterior oropharynx.  Tonsils are not hypertrophied Eyes:     Extraocular Movements: Extraocular movements intact.     Conjunctiva/sclera: Conjunctivae normal.     Pupils: Pupils are equal, round, and reactive to light.  Cardiovascular:     Rate and Rhythm: Normal rate and regular rhythm.     Heart sounds: No murmur heard. Pulmonary:     Effort: Pulmonary effort is normal. No respiratory distress.     Breath sounds: No stridor. No wheezing, rhonchi or rales.  Musculoskeletal:     Cervical back: Neck supple.  Lymphadenopathy:     Cervical: No cervical adenopathy.  Skin:    Capillary Refill: Capillary refill takes less than 2 seconds.     Coloration: Skin is not jaundiced or pale.  Neurological:     General: No focal deficit present.     Mental Status: She is alert and  oriented to person, place, and time.  Psychiatric:  Behavior: Behavior normal.      UC Treatments / Results  Labs (all labs ordered are listed, but only abnormal results are displayed) Labs Reviewed  POCT URINALYSIS DIP (MANUAL ENTRY) - Abnormal; Notable for the following components:      Result Value   Color, UA orange (*)    Ketones, POC UA trace (5) (*)    Spec Grav, UA >=1.030 (*)    Blood, UA moderate (*)    Protein Ur, POC =100 (*)    All other components within normal limits  URINE CULTURE  SARS CORONAVIRUS 2 (TAT 6-24 HRS)  POCT URINE PREGNANCY  POCT INFLUENZA A/B    EKG   Radiology No results found.  Procedures Procedures (including critical care time)  Medications Ordered in UC Medications - No data to display  Initial Impression / Assessment and Plan / UC Course  I have reviewed the triage vital signs and the nursing notes.  Pertinent labs & imaging results that were available during my care of the patient were reviewed by me and considered in my medical decision making (see chart for details).       UPT is negative  Urinalysis is orange and shows some red blood cells.  No white blood cells or nitrites.  Flu test is negative.  Tessalon Perles are sent in for the cough. COVID swab is done and if positive, she is a candidate for Paxlovid, as she is on immunosuppressive medication for her Crohn's disease  Last EGFR was 110 when last done in late 2023.  I suspect she has taken some AZO, but we will still culture the urine to ensure that none of this is due to a UTI.   Final Clinical Impressions(s) / UC Diagnoses   Final diagnoses:  Viral URI with cough  Dysuria     Discharge Instructions      The pregnancy test was negative.  Urine culture is sent to check for urine infection. There was a little blood, which could be a sign your period is about to start.  The flu test is negative   You have been swabbed for COVID, and the test  will result in the next 24 hours. Our staff will call you if positive. If the COVID test is positive, you should quarantine until you are fever free for 24 hours and you are starting to feel better, and then take added precautions for the next 5 days, such as physical distancing/wearing a mask and good hand hygiene/washing.  Take benzonatate 100 mg, 1 tab every 8 hours as needed for cough.        ED Prescriptions     Medication Sig Dispense Auth. Provider   benzonatate (TESSALON) 100 MG capsule Take 1 capsule (100 mg total) by mouth 3 (three) times daily as needed for cough. 21 capsule Zenia Resides, MD      PDMP not reviewed this encounter.   Zenia Resides, MD 03/05/23 [phone]

## 2023-03-05 NOTE — ED Triage Notes (Signed)
Pt c/o fever for past 2 days with nasal congestion and scratchy throat. States had lower back pain last night and woke up this morning with a headache. Took tylenol at [facility]

## 2023-03-05 NOTE — Discharge Instructions (Addendum)
The pregnancy test was negative.  Urine culture is sent to check for urine infection. There was a little blood, which could be a sign your period is about to start.  The flu test is negative   You have been swabbed for COVID, and the test will result in the next 24 hours. Our staff will call you if positive. If the COVID test is positive, you should quarantine until you are fever free for 24 hours and you are starting to feel better, and then take added precautions for the next 5 days, such as physical distancing/wearing a mask and good hand hygiene/washing.  Take benzonatate 100 mg, 1 tab every 8 hours as needed for cough.

## 2023-03-06 LAB — URINE CULTURE: Culture: 20000 — AB

## 2023-03-06 LAB — SARS CORONAVIRUS 2 (TAT 6-24 HRS): SARS Coronavirus 2: NEGATIVE

## 2023-03-10 ENCOUNTER — Telehealth (HOSPITAL_COMMUNITY): Payer: Self-pay | Admitting: Emergency Medicine

## 2023-03-10 NOTE — Telephone Encounter (Signed)
Patient left voicemail asking for explanation of recent urine culture results.  Attempted to reach patient x 1, VM is full

## 2023-03-17 ENCOUNTER — Inpatient Hospital Stay: Payer: Medicaid Other | Attending: Internal Medicine

## 2023-03-17 ENCOUNTER — Other Ambulatory Visit: Payer: Medicaid Other

## 2023-03-17 ENCOUNTER — Other Ambulatory Visit: Payer: Self-pay

## 2023-03-17 VITALS — BP 108/65 | HR 17 | Temp 98.9°F | Resp 16

## 2023-03-17 DIAGNOSIS — E538 Deficiency of other specified B group vitamins: Secondary | ICD-10-CM | POA: Insufficient documentation

## 2023-03-17 DIAGNOSIS — D509 Iron deficiency anemia, unspecified: Secondary | ICD-10-CM

## 2023-03-17 DIAGNOSIS — N920 Excessive and frequent menstruation with regular cycle: Secondary | ICD-10-CM | POA: Diagnosis present

## 2023-03-17 DIAGNOSIS — D500 Iron deficiency anemia secondary to blood loss (chronic): Secondary | ICD-10-CM | POA: Insufficient documentation

## 2023-03-17 MED ORDER — SODIUM CHLORIDE 0.9 % IV SOLN: Freq: Once | INTRAVENOUS | Status: AC

## 2023-03-17 MED ORDER — ACETAMINOPHEN 325 MG PO TABS
650.0000 mg | ORAL_TABLET | Freq: Once | ORAL | Status: AC
  Administered 2023-03-17: 650 mg via ORAL
  Filled 2023-03-17: qty 2

## 2023-03-17 MED ORDER — SODIUM CHLORIDE 0.9 % IV SOLN
510.0000 mg | Freq: Once | INTRAVENOUS | Status: AC
  Administered 2023-03-17: 510 mg via INTRAVENOUS
  Filled 2023-03-17: qty 510

## 2023-03-17 MED ORDER — LORATADINE 10 MG PO TABS
10.0000 mg | ORAL_TABLET | Freq: Once | ORAL | Status: AC
  Administered 2023-03-17: 10 mg via ORAL
  Filled 2023-03-17: qty 1

## 2023-03-17 NOTE — Progress Notes (Signed)
Patient declined to stay for 30 minute post iron observation, VS WNL.

## 2023-03-17 NOTE — Patient Instructions (Signed)

## 2023-03-25 ENCOUNTER — Ambulatory Visit
Admission: EM | Admit: 2023-03-25 | Discharge: 2023-03-25 | Disposition: A | Discharge status: Home / Self Care | Payer: Medicaid Other | Attending: Family Medicine | Admitting: Family Medicine

## 2023-03-25 DIAGNOSIS — M25512 Pain in left shoulder: Secondary | ICD-10-CM

## 2023-03-25 MED ORDER — KETOROLAC TROMETHAMINE 10 MG PO TABS: 10.0000 mg | ORAL_TABLET | Freq: Four times a day (QID) | ORAL | 0 refills | Status: DC | PRN

## 2023-03-25 MED ORDER — KETOROLAC TROMETHAMINE 30 MG/ML IJ SOLN
30.0000 mg | Freq: Once | INTRAMUSCULAR | Status: AC
  Administered 2023-03-25: 30 mg via INTRAMUSCULAR
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
## 2023-03-25 NOTE — Discharge Instructions (Signed)
You have been given a shot of Toradol 30 mg today.  Ketorolac 10 mg tablets--take 1 tablet every 6 hours as needed for pain.  This is the same medicine that is in the shot we just gave you   
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
## 2023-03-26 ENCOUNTER — Ambulatory Visit (INDEPENDENT_AMBULATORY_CARE_PROVIDER_SITE_OTHER): Payer: Medicaid Other

## 2023-03-26 ENCOUNTER — Ambulatory Visit
Admission: RE | Admit: 2023-03-26 | Discharge: 2023-03-26 | Disposition: A | Discharge status: Home / Self Care | Payer: Medicaid Other | Source: Ambulatory Visit | Attending: Family Medicine | Admitting: Family Medicine

## 2023-03-26 VITALS — BP 144/78 | HR 70 | Temp 98.2°F | Resp 16

## 2023-03-26 DIAGNOSIS — M25512 Pain in left shoulder: Secondary | ICD-10-CM | POA: Insufficient documentation

## 2023-03-26 DIAGNOSIS — S4992XA Unspecified injury of left shoulder and upper arm, initial encounter: Secondary | ICD-10-CM | POA: Diagnosis not present

## 2023-03-26 DIAGNOSIS — B3731 Acute candidiasis of vulva and vagina: Secondary | ICD-10-CM | POA: Insufficient documentation

## 2023-03-26 DIAGNOSIS — R300 Dysuria: Secondary | ICD-10-CM | POA: Diagnosis not present

## 2023-03-26 DIAGNOSIS — Z8759 Personal history of other complications of pregnancy, childbirth and the puerperium: Secondary | ICD-10-CM | POA: Insufficient documentation

## 2023-03-26 DIAGNOSIS — Z79620 Long term (current) use of immunosuppressive biologic: Secondary | ICD-10-CM | POA: Insufficient documentation

## 2023-03-26 DIAGNOSIS — Y92410 Unspecified street and highway as the place of occurrence of the external cause: Secondary | ICD-10-CM | POA: Diagnosis not present

## 2023-03-26 DIAGNOSIS — N760 Acute vaginitis: Secondary | ICD-10-CM | POA: Insufficient documentation

## 2023-03-26 LAB — POCT URINALYSIS DIP (MANUAL ENTRY)
Bilirubin, UA: NEGATIVE
Glucose, UA: NEGATIVE mg/dL
Ketones, POC UA: NEGATIVE mg/dL
Leukocytes, UA: NEGATIVE
Nitrite, UA: NEGATIVE
Protein Ur, POC: 100 mg/dL — AB
Spec Grav, UA: >=1.03 — AB (ref 1.010–1.025)
Urobilinogen, UA: 0.2 E.U./dL
pH, UA: 5.5 (ref 5.0–8.0)

## 2023-03-26 LAB — POCT URINE PREGNANCY: Preg Test, Ur: NEGATIVE

## 2023-03-26 MED ORDER — FLUCONAZOLE 150 MG PO TABS: 150.0000 mg | ORAL_TABLET | ORAL | 0 refills | Status: DC
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
## 2023-03-26 NOTE — Discharge Instructions (Addendum)
Vaginal cytology pending.  You were treated for BV previously however I did not see any results indicating that you had yeast.  Recheck cytology to ensure that BV nor yeast or any STDs are present.  Treating for vaginal irritation with Diflucan which will treat most yeast infections.  Take 1 tablet today and repeat in 3 days if needed. Our office will contact you if any additional treatment is needed. Shoulder x-ray is negative for any fracture or dislocation.  Continue with treatment recommendations during your visit yesterday.

## 2023-03-27 LAB — URINE CULTURE
Culture: NO GROWTH
Special Requests: NORMAL

## 2023-03-29 LAB — CERVICOVAGINAL ANCILLARY ONLY
Bacterial Vaginitis (gardnerella): POSITIVE — AB
Candida Glabrata: NEGATIVE
Candida Vaginitis: NEGATIVE
Chlamydia: NEGATIVE
Comment: NEGATIVE
Comment: NEGATIVE
Comment: NEGATIVE
Comment: NEGATIVE
Comment: NEGATIVE
Comment: NORMAL
Neisseria Gonorrhea: NEGATIVE
Trichomonas: NEGATIVE

## 2023-03-30 ENCOUNTER — Telehealth (HOSPITAL_COMMUNITY): Payer: Self-pay | Admitting: Emergency Medicine

## 2023-03-30 MED ORDER — METRONIDAZOLE 500 MG PO TABS: 500.0000 mg | ORAL_TABLET | Freq: Two times a day (BID) | ORAL | 0 refills | Status: DC

## 2023-04-06 DIAGNOSIS — H1031 Unspecified acute conjunctivitis, right eye: Secondary | ICD-10-CM | POA: Diagnosis not present

## 2023-04-06 DIAGNOSIS — L400 Psoriasis vulgaris: Secondary | ICD-10-CM | POA: Diagnosis not present

## 2023-04-06 DIAGNOSIS — L739 Follicular disorder, unspecified: Secondary | ICD-10-CM | POA: Diagnosis not present

## 2023-04-07 ENCOUNTER — Other Ambulatory Visit: Payer: Medicaid Other

## 2023-04-14 ENCOUNTER — Non-Acute Institutional Stay (HOSPITAL_COMMUNITY)
Admission: RE | Admit: 2023-04-14 | Discharge: 2023-04-14 | Disposition: A | Discharge status: Home / Self Care | Payer: Medicaid Other | Source: Ambulatory Visit | Attending: Internal Medicine | Admitting: Internal Medicine

## 2023-04-14 DIAGNOSIS — K50818 Crohn's disease of both small and large intestine with other complication: Secondary | ICD-10-CM | POA: Diagnosis present

## 2023-04-14 DIAGNOSIS — K5080 Crohn's disease of both small and large intestine without complications: Secondary | ICD-10-CM

## 2023-04-14 MED ORDER — DIPHENHYDRAMINE HCL 25 MG PO CAPS
50.0000 mg | ORAL_CAPSULE | Freq: Once | ORAL | Status: AC
  Administered 2023-04-14: 50 mg via ORAL
  Filled 2023-04-14 (×2): qty 2

## 2023-04-14 MED ORDER — SODIUM CHLORIDE 0.9 % IV SOLN
8.0000 mg | Freq: Once | INTRAVENOUS | Status: AC
  Administered 2023-04-14: 8 mg via INTRAVENOUS
  Filled 2023-04-14 (×2): qty 4

## 2023-04-14 MED ORDER — SODIUM CHLORIDE 0.9 % IV SOLN: INTRAVENOUS | Status: DC | PRN

## 2023-04-14 MED ORDER — ACETAMINOPHEN 325 MG PO TABS
650.0000 mg | ORAL_TABLET | Freq: Once | ORAL | Status: AC
  Administered 2023-04-14: 650 mg via ORAL
  Filled 2023-04-14: qty 2

## 2023-04-14 MED ORDER — SODIUM CHLORIDE 0.9 % IV SOLN
10.0000 mg/kg | Freq: Once | INTRAVENOUS | Status: AC
  Administered 2023-04-14: 700 mg via INTRAVENOUS
  Filled 2023-04-14: qty 70

## 2023-04-14 NOTE — Progress Notes (Addendum)
PATIENT CARE CENTER NOTE   Diagnosis: Crohn's disease of small and large intestine with other complication K50.818     Provider:  Corliss Parish MD     Procedure: Remicade 700mg  infusion     Note: Patient received Remicade infusion ( dose #2 of 6) via PIV. Patient premedicated with Tylenol and Benadryl PO. Infusion titrated per protocol.  After completing 4ml/hr of Remicade, pt states she feels nauseous and jittery. Infusion paused and provider notified. VSS.  8mg  IVPB ordered by Dr. Meridee Score. Pt states all symptoms are resolved after the zofran and she feels much better. Clarified with Dr. Meridee Score to restart infusion at 131ml/hr and titrate per orders as tolerated. Pt received remainder of infusion without difficulty, no s/s reaction noted. Pt declined to stay for further observation after infusion completed. VSS. Pt declined AVS. Patient to come back every 8 weeks for infusion, pt states she  will call to schedule next infusion. Alert, oriented and ambulatory at discharge.

## 2023-04-20 DIAGNOSIS — M79661 Pain in right lower leg: Secondary | ICD-10-CM | POA: Insufficient documentation

## 2023-04-20 DIAGNOSIS — M25512 Pain in left shoulder: Secondary | ICD-10-CM | POA: Insufficient documentation

## 2023-04-28 ENCOUNTER — Ambulatory Visit: Payer: Medicaid Other | Admitting: Family Medicine

## 2023-04-28 ENCOUNTER — Encounter: Payer: Self-pay | Admitting: Family Medicine

## 2023-04-28 ENCOUNTER — Other Ambulatory Visit: Payer: Medicaid Other

## 2023-04-28 ENCOUNTER — Other Ambulatory Visit (HOSPITAL_COMMUNITY)
Admission: RE | Admit: 2023-04-28 | Discharge: 2023-04-28 | Disposition: A | Discharge status: Home / Self Care | Payer: Medicaid Other | Source: Ambulatory Visit | Attending: Family Medicine | Admitting: Family Medicine

## 2023-04-28 VITALS — BP 109/72 | HR 92 | Temp 98.1°F | Resp 16 | Wt 149.2 lb

## 2023-04-28 DIAGNOSIS — R319 Hematuria, unspecified: Secondary | ICD-10-CM

## 2023-04-28 DIAGNOSIS — Z124 Encounter for screening for malignant neoplasm of cervix: Secondary | ICD-10-CM | POA: Insufficient documentation

## 2023-04-28 DIAGNOSIS — M25561 Pain in right knee: Secondary | ICD-10-CM | POA: Insufficient documentation

## 2023-04-28 DIAGNOSIS — R21 Rash and other nonspecific skin eruption: Secondary | ICD-10-CM

## 2023-04-28 MED ORDER — SULFAMETHOXAZOLE-TRIMETHOPRIM 800-160 MG PO TABS: 1.0000 | ORAL_TABLET | Freq: Two times a day (BID) | ORAL | 5 refills | Status: DC

## 2023-04-29 ENCOUNTER — Encounter: Payer: Self-pay | Admitting: Family Medicine

## 2023-04-29 LAB — CERVICOVAGINAL ANCILLARY ONLY
Bacterial Vaginitis (gardnerella): NEGATIVE
Candida Glabrata: NEGATIVE
Candida Vaginitis: POSITIVE — AB
Chlamydia: NEGATIVE
Comment: NEGATIVE
Comment: NEGATIVE
Comment: NEGATIVE
Comment: NEGATIVE
Comment: NEGATIVE
Comment: NORMAL
Neisseria Gonorrhea: NEGATIVE
Trichomonas: NEGATIVE

## 2023-04-29 LAB — POCT URINALYSIS DIP (CLINITEK)
Bilirubin, UA: NEGATIVE
Glucose, UA: NEGATIVE mg/dL
Leukocytes, UA: NEGATIVE
Nitrite, UA: NEGATIVE
Spec Grav, UA: >=1.03 — AB (ref 1.010–1.025)
Urobilinogen, UA: 0.2 E.U./dL
pH, UA: 5.5 (ref 5.0–8.0)

## 2023-04-29 MED ORDER — FLUCONAZOLE 150 MG PO TABS: 150.0000 mg | ORAL_TABLET | ORAL | 0 refills | Status: DC

## 2023-04-29 NOTE — Progress Notes (Signed)
Established Patient Office Visit  Subjective    Patient ID: Erika Stephens, female    DOB: 12/04/1990  Age: 32 y.o. MRN: [id number]  CC: No chief complaint on file.   HPI Erika Stephens presents for routine pap smear. Lastly she reports a rash with lesion over her extremities. Denies known contacts or exposures.    Outpatient Encounter Medications as of 04/28/2023  Medication Sig   Azelastine HCl 137 MCG/SPRAY SOLN 2 sprays.   inFLIXimab (REMICADE IV) Inject into the vein. Every 8 weeks   ketorolac (TORADOL) 10 MG tablet Take 1 tablet (10 mg total) by mouth every 6 (six) hours as needed (pain).   sulfamethoxazole-trimethoprim (BACTRIM DS) 800-160 MG tablet Take 1 tablet by mouth 2 (two) times daily.   fluconazole (DIFLUCAN) 150 MG tablet Take 1 tablet (150 mg total) by mouth every 3 (three) days. (Patient not taking: Reported on 04/28/2023)   metroNIDAZOLE (FLAGYL) 500 MG tablet Take 1 tablet (500 mg total) by mouth 2 (two) times daily. (Patient not taking: Reported on 04/28/2023)   No facility-administered encounter medications on file as of 04/28/2023.    Past Medical History:  Diagnosis Date   Anemia    Anxiety    Blood transfusion without reported diagnosis    Central centrifugal scarring alopecia    Chronic swimmer's ear of right side    Crohn's colitis (HCC)    IDA (iron deficiency anemia)    Impetigo    Postinflammatory hyperpigmentation    Pre-eclampsia in postpartum period 01/14/2019   Scalp psoriasis    Seborrheic dermatitis of scalp     Past Surgical History:  Procedure Laterality Date   APPENDECTOMY     BOWEL RESECTION     COLONOSCOPY     UPPER GASTROINTESTINAL ENDOSCOPY      Family History  Problem Relation Age of Onset   Crohn's disease Mother    Schizophrenia Father    Bipolar disorder Father    Crohn's disease Maternal Grandmother    Prostate cancer Maternal Uncle    Colon cancer Neg Hx    Stomach cancer Neg Hx    Pancreatic cancer Neg Hx     Esophageal cancer Neg Hx    Inflammatory bowel disease Neg Hx    Liver disease Neg Hx    Rectal cancer Neg Hx     Social History   Socioeconomic History   Marital status: Single    Spouse name: Not on file   Number of children: Not on file   Years of education: Not on file   Highest education level: Not on file  Occupational History   Not on file  Tobacco Use   Smoking status: Never    Passive exposure: Never   Smokeless tobacco: Never  Vaping Use   Vaping Use: Never used  Substance and Sexual Activity   Alcohol use: Yes    Comment: socially.    Drug use: Not Currently    Types: Marijuana    Comment: 08-10-22 last used per pt   Sexual activity: Not Currently    Birth control/protection: Condom  Other Topics Concern   Not on file  Social History Narrative   Not on file   Social Determinants of Health   Financial Resource Strain: Not on file  Food Insecurity: Not on file  Transportation Needs: Not on file  Physical Activity: Not on file  Stress: Not on file  Social Connections: Not on file  Intimate Partner Violence: Not on file  Review of Systems  Skin:  Positive for rash.  All other systems reviewed and are negative.       Objective    BP 109/72   Pulse 92   Temp 98.1 F (36.7 C) (Oral)   Resp 16   Wt 149 lb 3.2 oz (67.7 kg)   SpO2 97%   BMI 27.29 kg/m   Physical Exam Vitals and nursing note reviewed.  Constitutional:      General: She is not in acute distress. Cardiovascular:     Rate and Rhythm: Normal rate and regular rhythm.  Pulmonary:     Effort: Pulmonary effort is normal.     Breath sounds: Normal breath sounds.  Abdominal:     Palpations: Abdomen is soft.     Tenderness: There is no abdominal tenderness.  Genitourinary:    Exam position: Supine.     Labia:        Right: No rash or lesion.        Left: No rash or lesion.      Vagina: Normal.     Cervix: Normal.     Uterus: Normal.      Adnexa: Right adnexa normal and left  adnexa normal.  Lymphadenopathy:     Lower Body: No right inguinal adenopathy. No left inguinal adenopathy.  Skin:    Findings: Rash present.  Neurological:     General: No focal deficit present.     Mental Status: She is alert and oriented to person, place, and time.         Assessment & Plan:   1. Encounter for Papanicolaou smear of cervix  - Cervicovaginal ancillary only - Cytology - PAP - POCT URINALYSIS DIP (CLINITEK) - HIV antibody (with reflex)  2. Rash and nonspecific skin eruption Bactrim Ds prescribed. Skin care discussed.   3. Hematuria, unspecified type Antibiotic as above. Consider referral to urology id sx persist or worsen.    No follow-ups on file.   Tommie Raymond, MD

## 2023-05-03 LAB — CYTOLOGY - PAP: Diagnosis: NEGATIVE

## 2023-05-05 ENCOUNTER — Other Ambulatory Visit: Payer: Medicaid Other

## 2023-05-06 ENCOUNTER — Encounter: Payer: Medicaid Other | Admitting: Family Medicine

## 2023-05-12 ENCOUNTER — Ambulatory Visit
Admission: RE | Admit: 2023-05-12 | Discharge: 2023-05-12 | Disposition: A | Discharge status: Home / Self Care | Payer: Medicaid Other | Source: Ambulatory Visit | Attending: Emergency Medicine | Admitting: Emergency Medicine

## 2023-05-12 DIAGNOSIS — N644 Mastodynia: Secondary | ICD-10-CM | POA: Diagnosis not present

## 2023-05-12 DIAGNOSIS — M79661 Pain in right lower leg: Secondary | ICD-10-CM | POA: Diagnosis not present

## 2023-05-12 DIAGNOSIS — N6452 Nipple discharge: Secondary | ICD-10-CM | POA: Diagnosis not present

## 2023-05-17 ENCOUNTER — Ambulatory Visit: Payer: Medicaid Other | Admitting: Internal Medicine

## 2023-05-17 ENCOUNTER — Other Ambulatory Visit: Payer: Medicaid Other

## 2023-05-19 ENCOUNTER — Ambulatory Visit: Payer: Medicaid Other | Admitting: Family Medicine

## 2023-05-31 ENCOUNTER — Inpatient Hospital Stay (HOSPITAL_COMMUNITY)
Admission: RE | Admit: 2023-05-31 | Discharge: 2023-05-31 | Disposition: A | Discharge status: Home / Self Care | Payer: Medicaid Other | Source: Ambulatory Visit

## 2023-06-02 DIAGNOSIS — M25512 Pain in left shoulder: Secondary | ICD-10-CM | POA: Diagnosis not present

## 2023-06-02 DIAGNOSIS — M25561 Pain in right knee: Secondary | ICD-10-CM | POA: Diagnosis not present

## 2023-06-02 DIAGNOSIS — M79661 Pain in right lower leg: Secondary | ICD-10-CM | POA: Diagnosis not present

## 2023-06-08 ENCOUNTER — Non-Acute Institutional Stay (HOSPITAL_COMMUNITY)
Admission: RE | Admit: 2023-06-08 | Discharge: 2023-06-08 | Disposition: A | Discharge status: Home / Self Care | Payer: Medicaid Other | Source: Ambulatory Visit | Attending: Internal Medicine | Admitting: Internal Medicine

## 2023-06-08 DIAGNOSIS — K50818 Crohn's disease of both small and large intestine with other complication: Secondary | ICD-10-CM | POA: Insufficient documentation

## 2023-06-08 MED ORDER — ACETAMINOPHEN 325 MG PO TABS
650.0000 mg | ORAL_TABLET | Freq: Once | ORAL | Status: AC
  Administered 2023-06-08: 650 mg via ORAL
  Filled 2023-06-08: qty 2

## 2023-06-08 MED ORDER — DIPHENHYDRAMINE HCL 25 MG PO CAPS
50.0000 mg | ORAL_CAPSULE | Freq: Once | ORAL | Status: AC
  Administered 2023-06-08: 50 mg via ORAL
  Filled 2023-06-08: qty 2

## 2023-06-08 MED ORDER — SODIUM CHLORIDE 0.9 % IV SOLN: INTRAVENOUS | Status: DC | PRN

## 2023-06-08 MED ORDER — SODIUM CHLORIDE 0.9 % IV SOLN
10.0000 mg/kg | Freq: Once | INTRAVENOUS | Status: AC
  Administered 2023-06-08: 700 mg via INTRAVENOUS
  Filled 2023-06-08: qty 70

## 2023-06-08 NOTE — Progress Notes (Signed)
PATIENT CARE CENTER NOTE     Diagnosis: Crohn's disease of both small and large intestine with other complication (HCC) (R60.454)     Provider: Corliss Parish,  MD     Procedure: Remicade infusion 700mg      Note: Patient received Remicade infusion (dose # 3 of 6) via PIV. Patient pre-medicated with 650 mg Tylenol and 50 mg Benadryl PO as ordered. Infusion titrated per protocol. Patient tolerated well with no adverse reaction. Vital signs stable. Patient declined AVS and declined to stay for 1 hour observation. Patient to come back in 8 weeks for next infusion. Patient alert, oriented and ambulatory at discharge.

## 2023-06-09 ENCOUNTER — Ambulatory Visit: Payer: Medicaid Other | Admitting: Family Medicine

## 2023-06-09 DIAGNOSIS — M542 Cervicalgia: Secondary | ICD-10-CM | POA: Diagnosis not present

## 2023-06-09 DIAGNOSIS — M25512 Pain in left shoulder: Secondary | ICD-10-CM | POA: Diagnosis not present

## 2023-06-10 DIAGNOSIS — M542 Cervicalgia: Secondary | ICD-10-CM | POA: Insufficient documentation

## 2023-06-16 DIAGNOSIS — M79661 Pain in right lower leg: Secondary | ICD-10-CM | POA: Diagnosis not present

## 2023-06-17 ENCOUNTER — Telehealth: Payer: Self-pay | Admitting: Internal Medicine

## 2023-06-17 NOTE — Telephone Encounter (Signed)
Called to reschedule missed July appointments, left a voicemail.

## 2023-06-23 DIAGNOSIS — M79661 Pain in right lower leg: Secondary | ICD-10-CM | POA: Diagnosis not present

## 2023-06-30 ENCOUNTER — Other Ambulatory Visit (HOSPITAL_COMMUNITY)
Admission: RE | Admit: 2023-06-30 | Discharge: 2023-06-30 | Disposition: A | Discharge status: Home / Self Care | Payer: Medicaid Other | Source: Ambulatory Visit | Attending: Family Medicine | Admitting: Family Medicine

## 2023-06-30 ENCOUNTER — Ambulatory Visit: Payer: Medicaid Other | Admitting: Family Medicine

## 2023-06-30 VITALS — BP 121/84 | HR 76 | Temp 98.1°F | Resp 16 | Wt 143.2 lb

## 2023-06-30 DIAGNOSIS — T63301A Toxic effect of unspecified spider venom, accidental (unintentional), initial encounter: Secondary | ICD-10-CM | POA: Diagnosis not present

## 2023-06-30 DIAGNOSIS — R3121 Asymptomatic microscopic hematuria: Secondary | ICD-10-CM | POA: Diagnosis not present

## 2023-06-30 DIAGNOSIS — Z3044 Encounter for surveillance of vaginal ring hormonal contraceptive device: Secondary | ICD-10-CM

## 2023-06-30 DIAGNOSIS — A64 Unspecified sexually transmitted disease: Secondary | ICD-10-CM | POA: Insufficient documentation

## 2023-06-30 DIAGNOSIS — Z30015 Encounter for initial prescription of vaginal ring hormonal contraceptive: Secondary | ICD-10-CM

## 2023-06-30 DIAGNOSIS — M79661 Pain in right lower leg: Secondary | ICD-10-CM | POA: Diagnosis not present

## 2023-06-30 DIAGNOSIS — Z3202 Encounter for pregnancy test, result negative: Secondary | ICD-10-CM

## 2023-06-30 DIAGNOSIS — M542 Cervicalgia: Secondary | ICD-10-CM | POA: Diagnosis not present

## 2023-06-30 LAB — POCT URINALYSIS DIP (CLINITEK)
Bilirubin, UA: NEGATIVE
Glucose, UA: NEGATIVE mg/dL
Ketones, POC UA: NEGATIVE mg/dL
Leukocytes, UA: NEGATIVE
Nitrite, UA: NEGATIVE
POC PROTEIN,UA: NEGATIVE
Spec Grav, UA: >=1.03 — AB (ref 1.010–1.025)
Urobilinogen, UA: 0.2 E.U./dL
pH, UA: 6.5 (ref 5.0–8.0)

## 2023-06-30 LAB — POCT URINE PREGNANCY: Preg Test, Ur: NEGATIVE

## 2023-06-30 MED ORDER — ETONOGESTREL-ETHINYL ESTRADIOL 0.12-0.015 MG/24HR VA RING: VAGINAL_RING | VAGINAL | 5 refills | Status: DC

## 2023-06-30 NOTE — Progress Notes (Unsigned)
Patient is concern about UTI's she keep getting. Patient is concern about spider bite on right hand x 2 days ago STI check/birh control

## 2023-07-01 ENCOUNTER — Encounter: Payer: Self-pay | Admitting: Family Medicine

## 2023-07-01 LAB — CERVICOVAGINAL ANCILLARY ONLY
Bacterial Vaginitis (gardnerella): POSITIVE — AB
Candida Glabrata: NEGATIVE
Candida Vaginitis: NEGATIVE
Chlamydia: POSITIVE — AB
Comment: NEGATIVE
Comment: NEGATIVE
Comment: NEGATIVE
Comment: NEGATIVE
Comment: NEGATIVE
Comment: NORMAL
Neisseria Gonorrhea: NEGATIVE
Trichomonas: NEGATIVE

## 2023-07-01 MED ORDER — DOXYCYCLINE HYCLATE 100 MG PO TABS
100.0000 mg | ORAL_TABLET | Freq: Two times a day (BID) | ORAL | 0 refills | Status: DC
Start: 2023-07-01 — End: 2023-08-25

## 2023-07-01 MED ORDER — METRONIDAZOLE 500 MG PO TABS: 500.0000 mg | ORAL_TABLET | Freq: Two times a day (BID) | ORAL | 0 refills | Status: DC

## 2023-07-01 NOTE — Progress Notes (Signed)
Established Patient Office Visit  Subjective    Patient ID: Erika Stephens, female    DOB: 16-Jan-1991  Age: 32 y.o. MRN: [id number]  CC: No chief complaint on file.   HPI Erika Stephens presents for std screen and follow up of contraception. Patient also reports a spider bite.    Outpatient Encounter Medications as of 06/30/2023  Medication Sig   Azelastine HCl 137 MCG/SPRAY SOLN 2 sprays.   clobetasol cream (TEMOVATE) 0.05 % SMARTSIG:Sparingly Topical Twice Daily   etonogestrel-ethinyl estradiol (NUVARING) 0.12-0.015 MG/24HR vaginal ring Insert vaginally and leave in place for 3 consecutive weeks, then remove for 1 week.   inFLIXimab (REMICADE IV) Inject into the vein. Every 8 weeks   ketorolac (TORADOL) 10 MG tablet Take 1 tablet (10 mg total) by mouth every 6 (six) hours as needed (pain).   fluconazole (DIFLUCAN) 150 MG tablet Take 1 tablet (150 mg total) by mouth every 3 (three) days. (Patient not taking: Reported on 06/30/2023)   metroNIDAZOLE (FLAGYL) 500 MG tablet Take 1 tablet (500 mg total) by mouth 2 (two) times daily. (Patient not taking: Reported on 04/28/2023)   neomycin-polymyxin b-dexamethasone (MAXITROL) 3.5-10000-0.1 SUSP SMARTSIG:1 Drop(s) In Eye(s) Every 3-4 Hours (Patient not taking: Reported on 06/30/2023)   omeprazole (PRILOSEC) 40 MG capsule  (Patient not taking: Reported on 06/30/2023)   sulfamethoxazole-trimethoprim (BACTRIM DS) 800-160 MG tablet Take 1 tablet by mouth 2 (two) times daily. (Patient not taking: Reported on 06/30/2023)   No facility-administered encounter medications on file as of 06/30/2023.    Past Medical History:  Diagnosis Date   Anemia    Anxiety    Blood transfusion without reported diagnosis    Central centrifugal scarring alopecia    Chronic swimmer's ear of right side    Crohn's colitis (HCC)    IDA (iron deficiency anemia)    Impetigo    Postinflammatory hyperpigmentation    Pre-eclampsia in postpartum period 01/14/2019   Scalp  psoriasis    Seborrheic dermatitis of scalp     Past Surgical History:  Procedure Laterality Date   APPENDECTOMY     BOWEL RESECTION     COLONOSCOPY     UPPER GASTROINTESTINAL ENDOSCOPY      Family History  Problem Relation Age of Onset   Crohn's disease Mother    Schizophrenia Father    Bipolar disorder Father    Prostate cancer Maternal Uncle    Crohn's disease Maternal Grandmother    Colon cancer Neg Hx    Stomach cancer Neg Hx    Pancreatic cancer Neg Hx    Esophageal cancer Neg Hx    Inflammatory bowel disease Neg Hx    Liver disease Neg Hx    Rectal cancer Neg Hx    Breast cancer Neg Hx     Social History   Socioeconomic History   Marital status: Single    Spouse name: Not on file   Number of children: Not on file   Years of education: Not on file   Highest education level: Not on file  Occupational History   Not on file  Tobacco Use   Smoking status: Never    Passive exposure: Never   Smokeless tobacco: Never  Vaping Use   Vaping status: Never Used  Substance and Sexual Activity   Alcohol use: Yes    Comment: socially.    Drug use: Not Currently    Types: Marijuana    Comment: 08-10-22 last used per pt   Sexual activity: Not Currently  Birth control/protection: Condom  Other Topics Concern   Not on file  Social History Narrative   Not on file   Social Determinants of Health   Financial Resource Strain: Not on File (02/26/2022)   Received from [employer] [employer]   [employer]    Financial Resource Strain: 0  Food Insecurity: Not on File (02/26/2022)   Received from [place], [place]   Food Insecurity    Food: 0  Transportation Needs: Not on File (02/26/2022)   Received from [employer], [employer] Needs    Transportation: 0  Physical Activity: Not on File (02/26/2022)   Received from [place], [place]   Physical Activity    Physical Activity: 0  Stress: Not on File (02/26/2022)   Received from [facility], [place]   Stress    Stress: 0   Social Connections: Unknown (03/24/2022)   Received from [facility]   Social Network    Social Network: Not on file  Intimate Partner Violence: Unknown (02/13/2022)   Received from [facility]   HITS    Physically Hurt: Not on file    Insult or Talk Down To: Not on file    Threaten Physical Harm: Not on file    Scream or Curse: Not on file    Review of Systems  All other systems reviewed and are negative.       Objective    BP 121/84   Pulse 76   Temp 98.1 F (36.7 C) (Oral)   Resp 16   Wt 143 lb 3.2 oz (65 kg)   SpO2 100%   BMI 26.19 kg/m   Physical Exam Vitals and nursing note reviewed.  Constitutional:      General: She is not in acute distress. Cardiovascular:     Rate and Rhythm: Normal rate and regular rhythm.  Pulmonary:     Effort: Pulmonary effort is normal.     Breath sounds: Normal breath sounds.  Abdominal:     Palpations: Abdomen is soft.     Tenderness: There is no abdominal tenderness.  Neurological:     General: No focal deficit present.     Mental Status: She is alert and oriented to person, place, and time.         Assessment & Plan:   1. Asymptomatic microscopic hematuria Persistent hematuria. Referral to urology for further eval/mgt - POCT URINALYSIS DIP (CLINITEK) - Ambulatory referral to Urology  2. STI (sexually transmitted infection) Results pending.  - Cervicovaginal ancillary only  3. Encounter for surveillance of vaginal ring hormonal contraceptive device Ring refilled.  - POCT urine pregnancy  4. Accidental spider bite Observation. Skin care discussed. Tylenol/nsaids prn   Return in about 6 months (around 12/31/2023) for chronic med issues, follow up.   Erika Raymond, MD

## 2023-07-07 ENCOUNTER — Inpatient Hospital Stay (HOSPITAL_COMMUNITY)
Admission: RE | Admit: 2023-07-07 | Discharge: 2023-07-07 | Disposition: A | Discharge status: Home / Self Care | Payer: Medicaid Other | Source: Ambulatory Visit

## 2023-07-07 DIAGNOSIS — M25512 Pain in left shoulder: Secondary | ICD-10-CM | POA: Diagnosis not present

## 2023-07-07 DIAGNOSIS — M542 Cervicalgia: Secondary | ICD-10-CM | POA: Diagnosis not present

## 2023-07-21 DIAGNOSIS — M542 Cervicalgia: Secondary | ICD-10-CM | POA: Diagnosis not present

## 2023-07-26 ENCOUNTER — Encounter: Payer: Self-pay | Admitting: Family Medicine

## 2023-07-26 ENCOUNTER — Telehealth: Payer: Self-pay | Admitting: Family Medicine

## 2023-07-26 NOTE — Telephone Encounter (Signed)
Pt is calling in because she had a referral for Urology put in and hasn't heard anything about it since. Pt would like an update on that referral. Pt would also like to know if Dr. Andrey Campanile has any appointments sooner than 08/11/23 on a Wednesday.

## 2023-08-02 DIAGNOSIS — R600 Localized edema: Secondary | ICD-10-CM | POA: Diagnosis not present

## 2023-08-02 DIAGNOSIS — M79661 Pain in right lower leg: Secondary | ICD-10-CM | POA: Diagnosis not present

## 2023-08-02 DIAGNOSIS — M79604 Pain in right leg: Secondary | ICD-10-CM | POA: Diagnosis not present

## 2023-08-02 DIAGNOSIS — I83891 Varicose veins of right lower extremities with other complications: Secondary | ICD-10-CM | POA: Diagnosis not present

## 2023-08-04 ENCOUNTER — Inpatient Hospital Stay (HOSPITAL_COMMUNITY)
Admission: RE | Admit: 2023-08-04 | Discharge: 2023-08-04 | Disposition: A | Discharge status: Home / Self Care | Payer: Medicaid Other | Source: Ambulatory Visit

## 2023-08-05 ENCOUNTER — Telehealth: Payer: Self-pay | Admitting: Family Medicine

## 2023-08-06 DIAGNOSIS — M542 Cervicalgia: Secondary | ICD-10-CM | POA: Diagnosis not present

## 2023-08-11 ENCOUNTER — Ambulatory Visit: Payer: Medicaid Other | Admitting: Family Medicine

## 2023-08-25 ENCOUNTER — Encounter: Payer: Self-pay | Admitting: Family

## 2023-08-25 ENCOUNTER — Ambulatory Visit (INDEPENDENT_AMBULATORY_CARE_PROVIDER_SITE_OTHER): Payer: Medicaid Other | Admitting: Family

## 2023-08-25 ENCOUNTER — Non-Acute Institutional Stay (HOSPITAL_COMMUNITY)
Admission: RE | Admit: 2023-08-25 | Discharge: 2023-08-25 | Disposition: A | Discharge status: Home / Self Care | Payer: Medicaid Other | Source: Ambulatory Visit | Attending: Internal Medicine | Admitting: Internal Medicine

## 2023-08-25 VITALS — BP 107/73 | HR 71 | Temp 98.8°F | Ht 62.0 in | Wt 149.4 lb

## 2023-08-25 DIAGNOSIS — F419 Anxiety disorder, unspecified: Secondary | ICD-10-CM | POA: Diagnosis not present

## 2023-08-25 DIAGNOSIS — F32A Depression, unspecified: Secondary | ICD-10-CM

## 2023-08-25 DIAGNOSIS — R3121 Asymptomatic microscopic hematuria: Secondary | ICD-10-CM

## 2023-08-25 DIAGNOSIS — K50818 Crohn's disease of both small and large intestine with other complication: Secondary | ICD-10-CM | POA: Diagnosis present

## 2023-08-25 DIAGNOSIS — Z13228 Encounter for screening for other metabolic disorders: Secondary | ICD-10-CM | POA: Diagnosis not present

## 2023-08-25 DIAGNOSIS — G43909 Migraine, unspecified, not intractable, without status migrainosus: Secondary | ICD-10-CM

## 2023-08-25 MED ORDER — SODIUM CHLORIDE 0.9 % IV SOLN: INTRAVENOUS | Status: DC | PRN

## 2023-08-25 MED ORDER — SUMATRIPTAN SUCCINATE 25 MG PO TABS
ORAL_TABLET | ORAL | 0 refills | Status: DC
Start: 2023-08-25 — End: 2024-04-23

## 2023-08-25 MED ORDER — FLUOXETINE HCL 20 MG PO CAPS
20.0000 mg | ORAL_CAPSULE | Freq: Every day | ORAL | 1 refills | Status: DC
Start: 2023-08-25 — End: 2023-09-08

## 2023-08-25 MED ORDER — SODIUM CHLORIDE 0.9 % IV SOLN
10.0000 mg/kg | INTRAVENOUS | Status: DC
  Administered 2023-08-25: 700 mg via INTRAVENOUS
  Filled 2023-08-25: qty 70

## 2023-08-25 MED ORDER — DIPHENHYDRAMINE HCL 25 MG PO CAPS
50.0000 mg | ORAL_CAPSULE | Freq: Once | ORAL | Status: AC
  Administered 2023-08-25: 50 mg via ORAL
  Filled 2023-08-25: qty 2

## 2023-08-25 MED ORDER — HYDROXYZINE PAMOATE 25 MG PO CAPS
25.0000 mg | ORAL_CAPSULE | Freq: Three times a day (TID) | ORAL | 1 refills | Status: DC | PRN
Start: 2023-08-25 — End: 2024-04-23

## 2023-08-25 MED ORDER — ACETAMINOPHEN 325 MG PO TABS
650.0000 mg | ORAL_TABLET | Freq: Once | ORAL | Status: AC
  Administered 2023-08-25: 650 mg via ORAL
  Filled 2023-08-25: qty 2

## 2023-08-25 NOTE — Progress Notes (Signed)
Patient ID: Erika Stephens, female    DOB: 1991/01/13  MRN: [id number]  CC: Follow-Up  Subjective: Erika Stephens is a 32 y.o. female who presents for follow-up.   Her concerns today include:  - Reports needs new referral to Urology due to previous referral told her they do not accept her health insurance.  - Anxiety depression. States she is using cocaine and marijuana. She declines referral to Psychiatry. She would like to try medication to see if helps. She denies thoughts of self-harm, suicidal ideations, homicidal ideations. - States she had an abortion on 08/11/2023 at [facility]. Reports she is still experiencing some vaginal bleeding. She denies red flag symptoms. I discussed with patient in detail to follow-up with primary provider soon. Patient verbalized understanding/agreement. - Established with [facility] for chronic conditions management of back and neck pain. States they told her to come to Primary Care for labs but unsure which labs they want.  - Daily headaches. She denies red flag symptoms. She is taking over-the-counter Tylenol and Ibuprofen to help. - Established with [facility].   Patient Active Problem List   Diagnosis Date Noted   Dysuria 01/11/2023   Anorexia 07/03/2022   Nausea 07/03/2022   Unintentional weight loss 07/03/2022   Anxiety disorder 04/03/2021   Bipolar disorder (HCC) 04/03/2021   Hemorrhoids 12/26/2020   Chest pain of uncertain etiology 12/25/2020   Essential hypertension 12/25/2020   History of pre-eclampsia 12/25/2020   ASCUS of cervix with negative high risk HPV 11/27/2020   Iron deficiency anemia 11/14/2020   Fecal urgency 10/16/2020   Nasal sore 10/16/2020   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   Crohn's disease of both small and large intestine with other complication (HCC) 03/08/2020   History of iron deficiency anemia 03/08/2020   Infliximab (Remicade) long-term use 03/08/2020   History of  immunosuppression therapy 03/08/2020   Generalized abdominal pain 03/08/2020   Bloating symptom 03/08/2020   Chronic diarrhea 03/08/2020   Pre-eclampsia in postpartum period 01/14/2019   NSVD (normal spontaneous vaginal delivery) 12/28/2018   Cyst of ovary 12/21/2016   Crohn's disease (HCC) 11/27/2015   Anal pain 09/11/2013   Skin rash 09/11/2013   Immunosuppression (HCC) 06/07/2013     Current Outpatient Medications on File Prior to Visit  Medication Sig Dispense Refill   clobetasol cream (TEMOVATE) 0.05 % SMARTSIG:Sparingly Topical Twice Daily     etonogestrel-ethinyl estradiol (NUVARING) 0.12-0.015 MG/24HR vaginal ring Insert vaginally and leave in place for 3 consecutive weeks, then remove for 1 week. 1 each 5   inFLIXimab (REMICADE IV) Inject into the vein. Every 8 weeks     No current facility-administered medications on file prior to visit.    Allergies  Allergen Reactions   Adalimumab Swelling and Other (See Comments)   Aspirin Hives    Social History   Socioeconomic History   Marital status: Single    Spouse name: Not on file   Number of children: Not on file   Years of education: Not on file   Highest education level: Not on file  Occupational History   Not on file  Tobacco Use   Smoking status: Never    Passive exposure: Never   Smokeless tobacco: Never  Vaping Use   Vaping status: Never Used  Substance and Sexual Activity   Alcohol use: Yes    Comment: socially.    Drug use: Not Currently    Types: Marijuana    Comment: 08-10-22 last used  per pt   Sexual activity: Not Currently    Birth control/protection: Condom  Other Topics Concern   Not on file  Social History Narrative   Not on file   Social Determinants of Health   Financial Resource Strain: Not on File (02/26/2022)   Received from [employer] [employer]   [employer]    Financial Resource Strain: 0  Food Insecurity: Not on File (08/05/2023)   Received from [employer]     Food: 0  Transportation Needs: Not on File (02/26/2022)   Received from [employer], [employer] Needs    Transportation: 0  Physical Activity: Not on File (02/26/2022)   Received from [place], [place]   Physical Activity    Physical Activity: 0  Stress: Not on File (02/26/2022)   Received from [facility], [place]   Stress    Stress: 0  Social Connections: Not on File (07/24/2023)   Received from [facility]   Social Connections    Connectedness: 0  Intimate Partner Violence: Unknown (02/13/2022)   Received from [employer], [facility]   HITS    Physically Hurt: Not on file    Insult or Talk Down To: Not on file    Threaten Physical Harm: Not on file    Scream or Curse: Not on file    Family History  Problem Relation Age of Onset   Crohn's disease Mother    Schizophrenia Father    Bipolar disorder Father    Prostate cancer Maternal Uncle    Crohn's disease Maternal Grandmother    Colon cancer Neg Hx    Stomach cancer Neg Hx    Pancreatic cancer Neg Hx    Esophageal cancer Neg Hx    Inflammatory bowel disease Neg Hx    Liver disease Neg Hx    Rectal cancer Neg Hx    Breast cancer Neg Hx     Past Surgical History:  Procedure Laterality Date   APPENDECTOMY     BOWEL RESECTION     COLONOSCOPY     UPPER GASTROINTESTINAL ENDOSCOPY      ROS: Review of Systems Negative except as stated above  PHYSICAL EXAM: BP 107/73   Pulse 71   Temp 98.8 F (37.1 C) (Oral)   Ht 5\' 2"  (1.575 m)   Wt 149 lb 6.4 oz (67.8 kg)   LMP 06/18/2023 (Exact Date)   SpO2 96%   BMI 27.33 kg/m   Physical Exam HENT:     Head: Normocephalic and atraumatic.     Nose: Nose normal.     Mouth/Throat:     Mouth: Mucous membranes are moist.     Pharynx: Oropharynx is clear.  Eyes:     Extraocular Movements: Extraocular movements intact.     Conjunctiva/sclera: Conjunctivae normal.     Pupils: Pupils are equal, round, and reactive to light.  Cardiovascular:     Rate and Rhythm: Normal rate and  regular rhythm.     Pulses: Normal pulses.     Heart sounds: Normal heart sounds.  Pulmonary:     Effort: Pulmonary effort is normal.     Breath sounds: Normal breath sounds.  Musculoskeletal:        General: Normal range of motion.     Cervical back: Normal range of motion and neck supple.  Neurological:     General: No focal deficit present.     Mental Status: She is alert and oriented to person, place, and time.  Psychiatric:  Mood and Affect: Mood normal.        Behavior: Behavior normal.     ASSESSMENT AND PLAN: 1. Asymptomatic microscopic hematuria - Referral to Urology for evaluation/management. - Ambulatory referral to Urology  2. Anxiety and depression - Patient denies thoughts of self-harm, suicidal ideations, homicidal ideations. - Fluoxetine and Hydroxyzine as prescribed. Counseled on medication adherence/adverse effects. - Patient declined referral to Psychiatry.  - Follow-up with primary provider in 4 weeks or sooner if needed.  - FLUoxetine (PROZAC) 20 MG capsule; Take 1 capsule (20 mg total) by mouth daily.  Dispense: 30 capsule; Refill: 1 - hydrOXYzine (VISTARIL) 25 MG capsule; Take 1 capsule (25 mg total) by mouth every 8 (eight) hours as needed.  Dispense: 30 capsule; Refill: 1  3. Screening for metabolic disorder - Routine screening.  - CMP14+EGFR  4. Migraine without status migrainosus, not intractable, unspecified migraine type - Sumatriptan as prescribed. Counseled on medication adherence/adverse effects.  - Follow-up with primary provider in 4 weeks or sooner if needed.  - SUMAtriptan (IMITREX) 25 MG tablet; Take 25 mg (1 tablet total) by mouth at the start of the headache. May repeat in 2 hours x 1 if headache persists. Max of 2 tablets/24 hours.  Dispense: 10 tablet; Refill: 0    Patient was given the opportunity to ask questions.  Patient verbalized understanding of the plan and was able to repeat key elements of the plan. Patient was given  clear instructions to go to Emergency Department or return to medical center if symptoms don't improve, worsen, or new problems develop.The patient verbalized understanding.   Orders Placed This Encounter  Procedures   CMP14+EGFR   Ambulatory referral to Urology     Requested Prescriptions   Signed Prescriptions Disp Refills   FLUoxetine (PROZAC) 20 MG capsule 30 capsule 1    Sig: Take 1 capsule (20 mg total) by mouth daily.   hydrOXYzine (VISTARIL) 25 MG capsule 30 capsule 1    Sig: Take 1 capsule (25 mg total) by mouth every 8 (eight) hours as needed.   SUMAtriptan (IMITREX) 25 MG tablet 10 tablet 0    Sig: Take 25 mg (1 tablet total) by mouth at the start of the headache. May repeat in 2 hours x 1 if headache persists. Max of 2 tablets/24 hours.    Return in about 1 week (around 09/01/2023) for Follow-Up or next available with Georganna Skeans, MD .  Rema Fendt, NP

## 2023-08-25 NOTE — Progress Notes (Signed)
Patient says urologist doesn't take her insurance, needs referral to see why she always has blood in urine.  Needs mri looked at a Dr said something might be wrong with her spine.   Multiple concerns to discuss.

## 2023-08-25 NOTE — Progress Notes (Addendum)
PATIENT CARE CENTER NOTE   Diagnosis: Crhon's disease of both small and large intestine with other complication ( HCC) ( U13.244)     Provider:  Corliss Parish MD     Procedure: Remicade 10 mg/kg ( 700mg ) infusion     Note: Patient received Remicade infusion ( dose #4 of 6) via PIV. Prior to infusion, provider notified that pt states she is behind schedule was unable to attend her September infusion, made aware that last infusion was 06/08/23. Per provider administer today and pt to remain on Q8 weeks infusion schedule. Patient premedicated with Tylenol 650mg  and Benadryl 50mg  PO per orders. Infusion titrated per protocol. Patient tolerated well with no adverse reaction. Vital signs stable. Pt declined AVS. Patient to come back every 8 weeks for infusion, instructed to schedule at front desk prior to leaving, pt verbalized understanding. Alert, oriented and ambulatory at discharge.

## 2023-08-26 ENCOUNTER — Other Ambulatory Visit: Payer: Self-pay | Admitting: Family

## 2023-08-26 DIAGNOSIS — Z13228 Encounter for screening for other metabolic disorders: Secondary | ICD-10-CM

## 2023-08-26 LAB — CMP14+EGFR

## 2023-09-02 ENCOUNTER — Encounter: Payer: Self-pay | Admitting: Family Medicine

## 2023-09-02 ENCOUNTER — Ambulatory Visit: Payer: Medicaid Other | Admitting: Family Medicine

## 2023-09-02 VITALS — BP 99/62 | HR 86 | Temp 98.5°F | Resp 16 | Ht 63.0 in | Wt 150.0 lb

## 2023-09-02 DIAGNOSIS — Z13228 Encounter for screening for other metabolic disorders: Secondary | ICD-10-CM | POA: Diagnosis not present

## 2023-09-02 DIAGNOSIS — Z23 Encounter for immunization: Secondary | ICD-10-CM | POA: Diagnosis not present

## 2023-09-02 DIAGNOSIS — F419 Anxiety disorder, unspecified: Secondary | ICD-10-CM

## 2023-09-02 DIAGNOSIS — Z3009 Encounter for other general counseling and advice on contraception: Secondary | ICD-10-CM

## 2023-09-02 DIAGNOSIS — F32A Depression, unspecified: Secondary | ICD-10-CM

## 2023-09-02 NOTE — Progress Notes (Unsigned)
Established Patient Office Visit  Subjective    Patient ID: Kourtlyn Aberg, female    DOB: Mar 23, 1991  Age: 32 y.o. MRN: [id number]  CC:  Chief Complaint  Patient presents with   Follow-up    labs    HPI Justene Tashiro presents for contraception and follow up of anxiety/depression. Patient denies acute   Outpatient Encounter Medications as of 09/02/2023  Medication Sig   clobetasol cream (TEMOVATE) 0.05 % SMARTSIG:Sparingly Topical Twice Daily   etonogestrel-ethinyl estradiol (NUVARING) 0.12-0.015 MG/24HR vaginal ring Insert vaginally and leave in place for 3 consecutive weeks, then remove for 1 week.   FLUoxetine (PROZAC) 20 MG capsule Take 1 capsule (20 mg total) by mouth daily.   hydrOXYzine (VISTARIL) 25 MG capsule Take 1 capsule (25 mg total) by mouth every 8 (eight) hours as needed.   inFLIXimab (REMICADE IV) Inject into the vein. Every 8 weeks   SUMAtriptan (IMITREX) 25 MG tablet Take 25 mg (1 tablet total) by mouth at the start of the headache. May repeat in 2 hours x 1 if headache persists. Max of 2 tablets/24 hours.   No facility-administered encounter medications on file as of 09/02/2023.    Past Medical History:  Diagnosis Date   Anemia    Anxiety    Blood transfusion without reported diagnosis    Central centrifugal scarring alopecia    Chronic swimmer's ear of right side    Crohn's colitis (HCC)    IDA (iron deficiency anemia)    Impetigo    Postinflammatory hyperpigmentation    Pre-eclampsia in postpartum period 01/14/2019   Scalp psoriasis    Seborrheic dermatitis of scalp     Past Surgical History:  Procedure Laterality Date   APPENDECTOMY     BOWEL RESECTION     COLONOSCOPY     UPPER GASTROINTESTINAL ENDOSCOPY      Family History  Problem Relation Age of Onset   Crohn's disease Mother    Schizophrenia Father    Bipolar disorder Father    Prostate cancer Maternal Uncle    Crohn's disease Maternal Grandmother    Colon cancer Neg Hx    Stomach  cancer Neg Hx    Pancreatic cancer Neg Hx    Esophageal cancer Neg Hx    Inflammatory bowel disease Neg Hx    Liver disease Neg Hx    Rectal cancer Neg Hx    Breast cancer Neg Hx     Social History   Socioeconomic History   Marital status: Single    Spouse name: Not on file   Number of children: Not on file   Years of education: Not on file   Highest education level: Not on file  Occupational History   Not on file  Tobacco Use   Smoking status: Never    Passive exposure: Never   Smokeless tobacco: Never  Vaping Use   Vaping status: Never Used  Substance and Sexual Activity   Alcohol use: Yes    Comment: socially.    Drug use: Not Currently    Types: Marijuana    Comment: 08-10-22 last used per pt   Sexual activity: Not Currently    Birth control/protection: Condom  Other Topics Concern   Not on file  Social History Narrative   Not on file   Social Determinants of Health   Financial Resource Strain: Low Risk  (09/02/2023)   Overall Financial Resource Strain (CARDIA)    Difficulty of Paying Living Expenses: Not hard at all  Food  Insecurity: Not on File (08/05/2023)   Received from [employer]    Food: 0  Transportation Needs: No Transportation Needs (09/02/2023)   PRAPARE - Administrator, Civil Service (Medical): No    Lack of Transportation (Non-Medical): No  Physical Activity: Inactive (09/02/2023)   Exercise Vital Sign    Days of Exercise per Week: 0 days    Minutes of Exercise per Session: 0 min  Stress: No Stress Concern Present (09/02/2023)   [employer] of Occupational Health - Occupational Stress Questionnaire    Feeling of Stress : Only a little  Social Connections: Moderately Isolated (09/02/2023)   Social Connection and Isolation Panel [NHANES]    Frequency of Communication with Friends and Family: More than three times a week    Frequency of Social Gatherings with Friends and Family: Twice a week    Attends Religious  Services: 1 to 4 times per year    Active Member of [employer] or Organizations: No    Attends Banker Meetings: Never    Marital Status: Never married  Intimate Partner Violence: Not At Risk (09/02/2023)   Humiliation, Afraid, Rape, and Kick questionnaire    Fear of Current or Ex-Partner: No    Emotionally Abused: No    Physically Abused: No    Sexually Abused: No    ROS      Objective    BP 99/62 (BP Location: Right Arm, Patient Position: Sitting, Cuff Size: Normal)   Pulse 86   Temp 98.5 F (36.9 C) (Oral)   Resp 16   Ht 5\' 3"  (1.6 m)   Wt 150 lb (68 kg)   LMP 06/18/2023 (Exact Date)   SpO2 97%   BMI 26.57 kg/m   Physical Exam  {Labs (Optional):23779}    Assessment & Plan:   Anxiety and depression     No follow-ups on file.   Tommie Raymond, MD

## 2023-09-03 ENCOUNTER — Telehealth: Payer: Self-pay | Admitting: *Deleted

## 2023-09-03 LAB — CMP14+EGFR
ALT: 22 [IU]/L (ref 0–32)
AST: 27 [IU]/L (ref 0–40)
Albumin: 4 g/dL (ref 3.9–4.9)
Alkaline Phosphatase: 65 [IU]/L (ref 44–121)
BUN/Creatinine Ratio: 16 (ref 9–23)
BUN: 15 mg/dL (ref 6–20)
Bilirubin Total: 0.3 mg/dL (ref 0.0–1.2)
CO2: 19 mmol/L — ABNORMAL LOW (ref 20–29)
Calcium: 9.5 mg/dL (ref 8.7–10.2)
Chloride: 106 mmol/L (ref 96–106)
Creatinine, Ser: 0.91 mg/dL (ref 0.57–1.00)
Globulin, Total: 3.2 g/dL (ref 1.5–4.5)
Glucose: 117 mg/dL — ABNORMAL HIGH (ref 70–99)
Potassium: 3.6 mmol/L (ref 3.5–5.2)
Sodium: 138 mmol/L (ref 134–144)
Total Protein: 7.2 g/dL (ref 6.0–8.5)
eGFR: 85 mL/min/{1.73_m2} (ref >59)

## 2023-09-03 NOTE — Telephone Encounter (Signed)
Pt requesting  lab results from 09/02/23 on 09/03/23. Patient aware from viewing labs glucose elevated. Reviewed with patient to allow PCP additional time to review results and when PCP responds for any recommendations or results, someone from office will call her back . Patient reports she did have Halloween candy prior to labs but does report for the past few months, chronic mild headache back of head, very thirsty all of the time. C/o frequent urination issues at time. Please advise .

## 2023-09-06 NOTE — Telephone Encounter (Signed)
Please call for appt if patient request

## 2023-09-07 ENCOUNTER — Encounter: Payer: Self-pay | Admitting: Family Medicine

## 2023-09-08 ENCOUNTER — Encounter: Payer: Medicaid Other | Admitting: Family Medicine

## 2023-09-08 ENCOUNTER — Telehealth: Payer: Self-pay | Admitting: Family Medicine

## 2023-09-08 ENCOUNTER — Other Ambulatory Visit: Payer: Self-pay | Admitting: Family

## 2023-09-08 DIAGNOSIS — F419 Anxiety disorder, unspecified: Secondary | ICD-10-CM

## 2023-09-08 DIAGNOSIS — F32A Depression, unspecified: Secondary | ICD-10-CM

## 2023-09-08 NOTE — Telephone Encounter (Signed)
Reached out to patient to see if she needs sooner appointment to see provider due to previous concerns about labs went straight to voicemail and couldn't leave message sent patient a message in [place] stating if she needed to contact us she can for an earlier appointment.

## 2023-09-08 NOTE — Telephone Encounter (Signed)
REQUEST FOR 90 DAYS PRESCRIPTION

## 2023-09-09 DIAGNOSIS — I83891 Varicose veins of right lower extremities with other complications: Secondary | ICD-10-CM | POA: Diagnosis not present

## 2023-09-09 MED ORDER — FLUOXETINE HCL 20 MG PO CAPS: 20.0000 mg | ORAL_CAPSULE | Freq: Every day | ORAL | 1 refills | Status: DC

## 2023-09-09 NOTE — Telephone Encounter (Signed)
Requested Prescriptions  Refused Prescriptions Disp Refills   FLUoxetine (PROZAC) 20 MG capsule [Pharmacy Med Name: FLUOXETINE HCL 20 MG CAPSULE] 90 capsule 1    Sig: TAKE 1 CAPSULE BY MOUTH EVERY DAY     Psychiatry:  Antidepressants - SSRI Passed - 09/08/2023  9:20 AM      Passed - Valid encounter within last 6 months    Recent Outpatient Visits           1 week ago Anxiety and depression   [facility], MD   2 weeks ago Asymptomatic microscopic hematuria   [facility] Amy J, NP   2 months ago Asymptomatic microscopic hematuria   [facility], MD   4 months ago Encounter for Papanicolaou smear of cervix   [facility], MD   1 year ago Annual physical exam   [facility], MD       Future Appointments             In 6 days Georganna Skeans, MD [facility]   In 3 months Georganna Skeans, MD [facility] at [facility]

## 2023-09-15 ENCOUNTER — Encounter: Payer: Self-pay | Admitting: Family Medicine

## 2023-09-15 ENCOUNTER — Other Ambulatory Visit (HOSPITAL_COMMUNITY)
Admission: RE | Admit: 2023-09-15 | Discharge: 2023-09-15 | Disposition: A | Discharge status: Home / Self Care | Payer: Medicaid Other | Source: Ambulatory Visit | Attending: Family Medicine | Admitting: Family Medicine

## 2023-09-15 ENCOUNTER — Ambulatory Visit (INDEPENDENT_AMBULATORY_CARE_PROVIDER_SITE_OTHER): Payer: Medicaid Other | Admitting: Family Medicine

## 2023-09-15 VITALS — BP 111/71 | HR 65 | Temp 98.1°F | Resp 16 | Ht 63.0 in | Wt 145.2 lb

## 2023-09-15 DIAGNOSIS — Z130 Encounter for screening for diseases of the blood and blood-forming organs and certain disorders involving the immune mechanism: Secondary | ICD-10-CM | POA: Diagnosis not present

## 2023-09-15 DIAGNOSIS — Z13220 Encounter for screening for lipoid disorders: Secondary | ICD-10-CM

## 2023-09-15 DIAGNOSIS — Z13228 Encounter for screening for other metabolic disorders: Secondary | ICD-10-CM | POA: Diagnosis not present

## 2023-09-15 DIAGNOSIS — Z1329 Encounter for screening for other suspected endocrine disorder: Secondary | ICD-10-CM | POA: Diagnosis not present

## 2023-09-15 DIAGNOSIS — Z0000 Encounter for general adult medical examination without abnormal findings: Secondary | ICD-10-CM | POA: Diagnosis not present

## 2023-09-15 DIAGNOSIS — R319 Hematuria, unspecified: Secondary | ICD-10-CM | POA: Diagnosis not present

## 2023-09-15 DIAGNOSIS — Z113 Encounter for screening for infections with a predominantly sexual mode of transmission: Secondary | ICD-10-CM | POA: Insufficient documentation

## 2023-09-15 LAB — POCT URINALYSIS DIP (CLINITEK)
Bilirubin, UA: NEGATIVE
Glucose, UA: NEGATIVE mg/dL
Ketones, POC UA: NEGATIVE mg/dL
Leukocytes, UA: NEGATIVE
Nitrite, UA: NEGATIVE
POC PROTEIN,UA: 30 — AB
Spec Grav, UA: >=1.03 — AB (ref 1.010–1.025)
Urobilinogen, UA: 0.2 U/dL
pH, UA: 6.5 (ref 5.0–8.0)

## 2023-09-15 NOTE — Progress Notes (Signed)
Established Patient Office Visit  Subjective    Patient ID: Erika Stephens, female    DOB: 1991-03-11  Age: 32 y.o. MRN: [id number]  CC:  Chief Complaint  Patient presents with   Annual Exam    -Patient is here to have annually  complete physical examination  -Care gap address -labs taken     HPI Erika Stephens presents for routine annual exam. Patient denies acute complaints.   Outpatient Encounter Medications as of 09/15/2023  Medication Sig   clobetasol cream (TEMOVATE) 0.05 % SMARTSIG:Sparingly Topical Twice Daily   etonogestrel-ethinyl estradiol (NUVARING) 0.12-0.015 MG/24HR vaginal ring Insert vaginally and leave in place for 3 consecutive weeks, then remove for 1 week.   FLUoxetine (PROZAC) 20 MG capsule Take 1 capsule (20 mg total) by mouth daily.   hydrOXYzine (VISTARIL) 25 MG capsule Take 1 capsule (25 mg total) by mouth every 8 (eight) hours as needed.   inFLIXimab (REMICADE IV) Inject into the vein. Every 8 weeks   oseltamivir (TAMIFLU) 75 MG capsule Take 75 mg by mouth 2 (two) times daily.   promethazine-dextromethorphan (PROMETHAZINE-DM) 6.25-15 MG/5ML syrup Take 5 mLs by mouth every 4 (four) hours as needed.   SUMAtriptan (IMITREX) 25 MG tablet Take 25 mg (1 tablet total) by mouth at the start of the headache. May repeat in 2 hours x 1 if headache persists. Max of 2 tablets/24 hours.   triamcinolone ointment (KENALOG) 0.1 % Apply topically.   No facility-administered encounter medications on file as of 09/15/2023.    Past Medical History:  Diagnosis Date   Anemia    Anxiety    Blood transfusion without reported diagnosis    Central centrifugal scarring alopecia    Chronic swimmer's ear of right side    Crohn's colitis (HCC)    IDA (iron deficiency anemia)    Impetigo    Postinflammatory hyperpigmentation    Pre-eclampsia in postpartum period 01/14/2019   Scalp psoriasis    Seborrheic dermatitis of scalp     Past Surgical History:  Procedure Laterality Date    APPENDECTOMY     BOWEL RESECTION     COLONOSCOPY     UPPER GASTROINTESTINAL ENDOSCOPY      Family History  Problem Relation Age of Onset   Crohn's disease Mother    Schizophrenia Father    Bipolar disorder Father    Prostate cancer Maternal Uncle    Crohn's disease Maternal Grandmother    Colon cancer Neg Hx    Stomach cancer Neg Hx    Pancreatic cancer Neg Hx    Esophageal cancer Neg Hx    Inflammatory bowel disease Neg Hx    Liver disease Neg Hx    Rectal cancer Neg Hx    Breast cancer Neg Hx     Social History   Socioeconomic History   Marital status: Single    Spouse name: Not on file   Number of children: Not on file   Years of education: Not on file   Highest education level: Not on file  Occupational History   Not on file  Tobacco Use   Smoking status: Never    Passive exposure: Never   Smokeless tobacco: Never  Vaping Use   Vaping status: Never Used  Substance and Sexual Activity   Alcohol use: Yes    Comment: socially.    Drug use: Not Currently    Types: Marijuana    Comment: 08-10-22 last used per pt   Sexual activity: Not Currently  Birth control/protection: Condom  Other Topics Concern   Not on file  Social History Narrative   Not on file   Social Determinants of Health   Financial Resource Strain: Low Risk  (09/02/2023)   Overall Financial Resource Strain (CARDIA)    Difficulty of Paying Living Expenses: Not hard at all  Food Insecurity: Food Insecurity Present (09/15/2023)   Hunger Vital Sign    Worried About Running Out of Food in the Last Year: Sometimes true    Ran Out of Food in the Last Year: Sometimes true  Transportation Needs: No Transportation Needs (09/02/2023)   PRAPARE - Administrator, Civil Service (Medical): No    Lack of Transportation (Non-Medical): No  Physical Activity: Inactive (09/02/2023)   Exercise Vital Sign    Days of Exercise per Week: 0 days    Minutes of Exercise per Session: 0 min  Stress:  No Stress Concern Present (09/02/2023)   [employer] of Occupational Health - Occupational Stress Questionnaire    Feeling of Stress : Only a little  Social Connections: Moderately Isolated (09/02/2023)   Social Connection and Isolation Panel [NHANES]    Frequency of Communication with Friends and Family: More than three times a week    Frequency of Social Gatherings with Friends and Family: Twice a week    Attends Religious Services: 1 to 4 times per year    Active Member of [employer] or Organizations: No    Attends Banker Meetings: Never    Marital Status: Never married  Intimate Partner Violence: Not At Risk (09/02/2023)   Humiliation, Afraid, Rape, and Kick questionnaire    Fear of Current or Ex-Partner: No    Emotionally Abused: No    Physically Abused: No    Sexually Abused: No    Review of Systems  All other systems reviewed and are negative.       Objective    BP 111/71   Pulse 65   Temp 98.1 F (36.7 C) (Oral)   Resp 16   Ht 5\' 3"  (1.6 m)   Wt 145 lb 3.2 oz (65.9 kg)   LMP 06/18/2023 (Exact Date)   SpO2 98%   BMI 25.72 kg/m   Physical Exam Vitals and nursing note reviewed.  Constitutional:      General: She is not in acute distress. HENT:     Head: Normocephalic and atraumatic.     Right Ear: Tympanic membrane, ear canal and external ear normal.     Left Ear: Tympanic membrane, ear canal and external ear normal.     Nose: Nose normal.     Mouth/Throat:     Mouth: Mucous membranes are moist.     Pharynx: Oropharynx is clear.  Eyes:     Conjunctiva/sclera: Conjunctivae normal.     Pupils: Pupils are equal, round, and reactive to light.  Neck:     Thyroid: No thyromegaly.  Cardiovascular:     Rate and Rhythm: Normal rate and regular rhythm.     Heart sounds: Normal heart sounds. No murmur heard. Pulmonary:     Effort: Pulmonary effort is normal. No respiratory distress.     Breath sounds: Normal breath sounds.  Abdominal:      General: There is no distension.     Palpations: Abdomen is soft. There is no mass.     Tenderness: There is no abdominal tenderness.  Musculoskeletal:        General: Normal range of motion.  Cervical back: Normal range of motion and neck supple.  Skin:    General: Skin is warm and dry.  Neurological:     General: No focal deficit present.     Mental Status: She is alert and oriented to person, place, and time.  Psychiatric:        Mood and Affect: Mood normal.        Behavior: Behavior normal.         Assessment & Plan:   Annual physical exam  Screening for deficiency anemia -     CBC with Differential/Platelet  Screening for lipid disorders -     Lipid panel  Screen for STD (sexually transmitted disease) -     Cervicovaginal ancillary only  Screening for endocrine/metabolic/immunity disorders -     TSH + free T4  Hematuria, unspecified type -     POCT URINALYSIS DIP (CLINITEK)     Return if symptoms worsen or fail to improve.   Tommie Raymond, MD

## 2023-09-16 ENCOUNTER — Other Ambulatory Visit: Payer: Self-pay | Admitting: Family Medicine

## 2023-09-16 ENCOUNTER — Ambulatory Visit: Payer: Self-pay

## 2023-09-16 LAB — CBC WITH DIFFERENTIAL/PLATELET
Basophils Absolute: 0.1 10*3/uL (ref 0.0–0.2)
Basos: 1 %
EOS (ABSOLUTE): 0.2 10*3/uL (ref 0.0–0.4)
Eos: 3 %
Hematocrit: 32.9 % — ABNORMAL LOW (ref 34.0–46.6)
Hemoglobin: 10.4 g/dL — ABNORMAL LOW (ref 11.1–15.9)
Immature Grans (Abs): 0 10*3/uL (ref 0.0–0.1)
Immature Granulocytes: 0 %
Lymphocytes Absolute: 3.3 10*3/uL — ABNORMAL HIGH (ref 0.7–3.1)
Lymphs: 35 %
MCH: 28.8 pg (ref 26.6–33.0)
MCHC: 31.6 g/dL (ref 31.5–35.7)
MCV: 91 fL (ref 79–97)
Monocytes Absolute: 0.7 10*3/uL (ref 0.1–0.9)
Monocytes: 7 %
Neutrophils Absolute: 5.3 10*3/uL (ref 1.4–7.0)
Neutrophils: 54 %
Platelets: 277 10*3/uL (ref 150–450)
RBC: 3.61 x10E6/uL — ABNORMAL LOW (ref 3.77–5.28)
RDW: 12.8 % (ref 11.7–15.4)
WBC: 9.6 10*3/uL (ref 3.4–10.8)

## 2023-09-16 LAB — CERVICOVAGINAL ANCILLARY ONLY
Bacterial Vaginitis (gardnerella): POSITIVE — AB
Candida Glabrata: NEGATIVE
Candida Vaginitis: NEGATIVE
Chlamydia: NEGATIVE
Comment: NEGATIVE
Comment: NEGATIVE
Comment: NEGATIVE
Comment: NEGATIVE
Comment: NEGATIVE
Comment: NORMAL
Neisseria Gonorrhea: NEGATIVE
Trichomonas: NEGATIVE

## 2023-09-16 LAB — LIPID PANEL
Chol/HDL Ratio: 3.3 ratio (ref 0.0–4.4)
Cholesterol, Total: 177 mg/dL (ref 100–199)
HDL: 54 mg/dL (ref >39)
LDL Chol Calc (NIH): 106 mg/dL — ABNORMAL HIGH (ref 0–99)
Triglycerides: 90 mg/dL (ref 0–149)
VLDL Cholesterol Cal: 17 mg/dL (ref 5–40)

## 2023-09-16 LAB — TSH+FREE T4
Free T4: 1.22 ng/dL (ref 0.82–1.77)
TSH: 1.09 u[IU]/mL (ref 0.450–4.500)

## 2023-09-16 MED ORDER — METRONIDAZOLE 500 MG PO TABS: 500.0000 mg | ORAL_TABLET | Freq: Two times a day (BID) | ORAL | 0 refills | Status: AC

## 2023-09-16 MED ORDER — FERROUS SULFATE 325 (65 FE) MG PO TABS: 325.0000 mg | ORAL_TABLET | Freq: Every day | ORAL | 1 refills | Status: DC

## 2023-09-16 NOTE — Progress Notes (Signed)
I called and spoke to patient and gave her results.  Patient stated " someone had already called and gave them to her."

## 2023-09-16 NOTE — Progress Notes (Signed)
I called patient and made her aware that she was positive for BV and the MD Wilson prescribed her Flagyl.

## 2023-09-16 NOTE — Telephone Encounter (Signed)
Pt given lab results per notes of Dr. Andrey Campanile on 09/16/23. Pt verbalized understanding. Advised Rx was sent in to pharmacy for Iron supplement  Anemia noted. Recommend iron supplements and recheck labs in 2-3 months.  Written by Georganna Skeans, MD on 09/16/2023  8:17 AM EST Summary: medication question   Patient called ad states she had a Physical appointment with Dr Andrey Campanile yesterday on 09/16/23 and states she had some tests done but she was questioning if any medication had been sent in from that visit? Patient is not sure if medication was supposed to be called in for her. Please contact patient back @ # [phone]     Reason for Disposition  Caller requesting lab results  (Exception: Routine or non-urgent lab result.)  Answer Assessment - Initial Assessment Questions 1. REASON FOR CALL or QUESTION: "What is your reason for calling today?" or "How can I best help you?" or "What question do you have that I can help answer?"     I have a medicine at my pharmacy, is it because of one of my lab results? 2. CALLER: Document the source of call. (e.g., laboratory, patient).     Patient  Protocols used: PCP Call - No Triage-A-AH

## 2023-09-22 DIAGNOSIS — Z09 Encounter for follow-up examination after completed treatment for conditions other than malignant neoplasm: Secondary | ICD-10-CM | POA: Diagnosis not present

## 2023-09-22 DIAGNOSIS — I83891 Varicose veins of right lower extremities with other complications: Secondary | ICD-10-CM | POA: Diagnosis not present

## 2023-09-28 DIAGNOSIS — M4802 Spinal stenosis, cervical region: Secondary | ICD-10-CM | POA: Insufficient documentation

## 2023-09-28 DIAGNOSIS — M542 Cervicalgia: Secondary | ICD-10-CM | POA: Diagnosis not present

## 2023-10-14 NOTE — Telephone Encounter (Signed)
Appointment scheduled for remicade at [facility] on 12/9 at 9:30am

## 2023-10-18 ENCOUNTER — Inpatient Hospital Stay (HOSPITAL_COMMUNITY): Admission: RE | Admit: 2023-10-18 | Payer: Medicaid Other | Source: Ambulatory Visit

## 2023-10-18 DIAGNOSIS — M5412 Radiculopathy, cervical region: Secondary | ICD-10-CM | POA: Insufficient documentation

## 2023-10-20 ENCOUNTER — Non-Acute Institutional Stay (HOSPITAL_COMMUNITY)
Admission: RE | Admit: 2023-10-20 | Discharge: 2023-10-20 | Disposition: A | Discharge status: Home / Self Care | Payer: Medicaid Other | Source: Ambulatory Visit | Attending: Internal Medicine | Admitting: Internal Medicine

## 2023-10-20 DIAGNOSIS — K50819 Crohn's disease of both small and large intestine with unspecified complications: Secondary | ICD-10-CM | POA: Insufficient documentation

## 2023-10-20 MED ORDER — SODIUM CHLORIDE 0.9 % IV SOLN
10.0000 mg/kg | INTRAVENOUS | Status: DC
  Administered 2023-10-20: 700 mg via INTRAVENOUS
  Filled 2023-10-20: qty 70

## 2023-10-20 MED ORDER — DIPHENHYDRAMINE HCL 25 MG PO CAPS
50.0000 mg | ORAL_CAPSULE | Freq: Once | ORAL | Status: AC
  Administered 2023-10-20: 50 mg via ORAL
  Filled 2023-10-20: qty 2

## 2023-10-20 MED ORDER — ACETAMINOPHEN 325 MG PO TABS
650.0000 mg | ORAL_TABLET | Freq: Once | ORAL | Status: AC
  Administered 2023-10-20: 650 mg via ORAL
  Filled 2023-10-20: qty 2

## 2023-10-20 MED ORDER — SODIUM CHLORIDE 0.9 % IV SOLN: INTRAVENOUS | Status: DC | PRN

## 2023-10-20 NOTE — Progress Notes (Signed)
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 Pt pre-medicated with PO Benadryl 50 mg and Tylenol 650 mg. Infusion titrated per protocol. Pt tolerated with no adverse reaction. Vitals are stable. AVS offered, but pt declined. Pt advised to schedule her next infusion for 8 weeks at the front desk. Pt is alert, oriented, and ambulatory at discharge.

## 2023-10-21 DIAGNOSIS — M5412 Radiculopathy, cervical region: Secondary | ICD-10-CM | POA: Diagnosis not present

## 2023-10-26 DIAGNOSIS — I83891 Varicose veins of right lower extremities with other complications: Secondary | ICD-10-CM | POA: Diagnosis not present

## 2023-10-26 DIAGNOSIS — R252 Cramp and spasm: Secondary | ICD-10-CM | POA: Diagnosis not present

## 2023-10-26 DIAGNOSIS — I87391 Chronic venous hypertension (idiopathic) with other complications of right lower extremity: Secondary | ICD-10-CM | POA: Diagnosis not present

## 2023-10-26 DIAGNOSIS — R600 Localized edema: Secondary | ICD-10-CM | POA: Diagnosis not present

## 2023-11-16 ENCOUNTER — Encounter: Payer: Self-pay | Admitting: Family Medicine

## 2023-11-23 ENCOUNTER — Other Ambulatory Visit: Payer: Self-pay | Admitting: Family Medicine

## 2023-11-23 NOTE — Telephone Encounter (Signed)
 Medication Refill -  Most Recent Primary Care Visit:  Provider: TANDA BLEACHER  Department: [facility]  Visit Type: PHYSICAL  Date: 09/15/2023  Medication: etonogestrel -ethinyl estradiol  (NUVARING) 0.12-0.015 MG/24HR vaginal ring   Has the patient contacted their pharmacy? Yes  Is this the correct pharmacy for this prescription? Yes If no, delete pharmacy and type the correct one.  This is the patient's preferred pharmacy:  [facility] [address], [place] - [address] [place] [address] Phone: [phone] Fax: [phone]   Has the prescription been filled recently? Yes  Is the patient out of the medication? Yes  Has the patient been seen for an appointment in the last year OR does the patient have an upcoming appointment? Yes  Can we respond through MyChart? Yes  Agent: Please be advised that Rx refills may take up to 3 business days. We ask that you follow-up with your pharmacy.

## 2023-11-24 MED ORDER — ETONOGESTREL-ETHINYL ESTRADIOL 0.12-0.015 MG/24HR VA RING: VAGINAL_RING | VAGINAL | 2 refills | Status: DC

## 2023-11-24 NOTE — Telephone Encounter (Signed)
 Requested Prescriptions  Pending Prescriptions Disp Refills   etonogestrel -ethinyl estradiol  (NUVARING) 0.12-0.015 MG/24HR vaginal ring 3 each 1    Sig: Insert vaginally and leave in place for 3 consecutive weeks, then remove for 1 week.     OB/GYN:  Contraceptives Passed - 11/24/2023 10:10 AM      Passed - Last BP in normal range    BP Readings from Last 1 Encounters:  10/20/23 (!) 112/58         Passed - Valid encounter within last 12 months    Recent Outpatient Visits           2 months ago Annual physical exam   [facility], MD   2 months ago Anxiety and depression   [place] [facility], MD   3 months ago Asymptomatic microscopic hematuria   [place] [facility] [place], NP   4 months ago Asymptomatic microscopic hematuria   [place] [facility], MD   7 months ago Encounter for Papanicolaou smear of cervix   [place] [facility], MD       Future Appointments             In 1 month Abraham Abo, MD [facility] - Patient is not a smoker

## 2023-11-30 ENCOUNTER — Ambulatory Visit (INDEPENDENT_AMBULATORY_CARE_PROVIDER_SITE_OTHER): Payer: Medicaid Other | Admitting: Family Medicine

## 2023-11-30 ENCOUNTER — Other Ambulatory Visit (HOSPITAL_COMMUNITY)
Admission: RE | Admit: 2023-11-30 | Discharge: 2023-11-30 | Disposition: A | Discharge status: Home / Self Care | Payer: Medicaid Other | Source: Ambulatory Visit | Attending: Family Medicine | Admitting: Family Medicine

## 2023-11-30 VITALS — BP 102/64 | HR 74 | Temp 97.9°F | Resp 16 | Wt 144.4 lb

## 2023-11-30 DIAGNOSIS — T7840XA Allergy, unspecified, initial encounter: Secondary | ICD-10-CM

## 2023-11-30 DIAGNOSIS — Z7251 High risk heterosexual behavior: Secondary | ICD-10-CM | POA: Diagnosis not present

## 2023-11-30 DIAGNOSIS — Z113 Encounter for screening for infections with a predominantly sexual mode of transmission: Secondary | ICD-10-CM

## 2023-11-30 DIAGNOSIS — Z3202 Encounter for pregnancy test, result negative: Secondary | ICD-10-CM

## 2023-11-30 LAB — POCT URINE PREGNANCY: Preg Test, Ur: NEGATIVE

## 2023-11-30 MED ORDER — TRIAMCINOLONE ACETONIDE 40 MG/ML IJ SUSP
40.0000 mg | Freq: Once | INTRAMUSCULAR | Status: AC
  Administered 2023-11-30: 40 mg via INTRAMUSCULAR

## 2023-11-30 NOTE — Progress Notes (Unsigned)
Patient c/o allergic reaction to something she may have eaten but not sure what it is. Patient request STI and pregnancy test

## 2023-12-01 ENCOUNTER — Encounter: Payer: Self-pay | Admitting: Family Medicine

## 2023-12-01 DIAGNOSIS — R252 Cramp and spasm: Secondary | ICD-10-CM | POA: Diagnosis not present

## 2023-12-01 DIAGNOSIS — I872 Venous insufficiency (chronic) (peripheral): Secondary | ICD-10-CM | POA: Diagnosis not present

## 2023-12-01 DIAGNOSIS — I87391 Chronic venous hypertension (idiopathic) with other complications of right lower extremity: Secondary | ICD-10-CM | POA: Diagnosis not present
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
## 2023-12-02 LAB — CERVICOVAGINAL ANCILLARY ONLY
Bacterial Vaginitis (gardnerella): POSITIVE — AB
Candida Glabrata: NEGATIVE
Candida Vaginitis: NEGATIVE
Chlamydia: NEGATIVE
Comment: NEGATIVE
Comment: NEGATIVE
Comment: NEGATIVE
Comment: NEGATIVE
Comment: NEGATIVE
Comment: NORMAL
Neisseria Gonorrhea: NEGATIVE
Trichomonas: NEGATIVE

## 2023-12-03 ENCOUNTER — Encounter: Payer: Self-pay | Admitting: Family Medicine

## 2023-12-03 ENCOUNTER — Other Ambulatory Visit: Payer: Self-pay | Admitting: Family Medicine

## 2023-12-03 MED ORDER — METRONIDAZOLE 500 MG PO TABS: 500.0000 mg | ORAL_TABLET | Freq: Two times a day (BID) | ORAL | 0 refills | Status: AC

## 2023-12-07 DIAGNOSIS — M47812 Spondylosis without myelopathy or radiculopathy, cervical region: Secondary | ICD-10-CM | POA: Insufficient documentation

## 2023-12-13 ENCOUNTER — Encounter (HOSPITAL_COMMUNITY): Payer: Medicaid Other

## 2023-12-20 ENCOUNTER — Non-Acute Institutional Stay (HOSPITAL_COMMUNITY)
Admission: RE | Admit: 2023-12-20 | Discharge: 2023-12-20 | Disposition: A | Discharge status: Home / Self Care | Payer: Medicaid Other | Source: Ambulatory Visit | Attending: Internal Medicine | Admitting: Internal Medicine

## 2023-12-20 DIAGNOSIS — K50818 Crohn's disease of both small and large intestine with other complication: Secondary | ICD-10-CM | POA: Insufficient documentation

## 2023-12-20 MED ORDER — DIPHENHYDRAMINE HCL 25 MG PO CAPS
50.0000 mg | ORAL_CAPSULE | Freq: Once | ORAL | Status: AC
  Administered 2023-12-20: 50 mg via ORAL
  Filled 2023-12-20: qty 2

## 2023-12-20 MED ORDER — ACETAMINOPHEN 325 MG PO TABS
650.0000 mg | ORAL_TABLET | Freq: Once | ORAL | Status: AC
  Administered 2023-12-20: 650 mg via ORAL
  Filled 2023-12-20: qty 2

## 2023-12-20 MED ORDER — SODIUM CHLORIDE 0.9 % IV SOLN: INTRAVENOUS | Status: DC | PRN

## 2023-12-20 MED ORDER — SODIUM CHLORIDE 0.9 % IV SOLN
700.0000 mg | INTRAVENOUS | Status: DC
  Administered 2023-12-20: 700 mg via INTRAVENOUS
  Filled 2023-12-20: qty 70

## 2023-12-20 NOTE — Progress Notes (Signed)
 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 Pt pre-medicated with PO Benadryl  50 mg and PO Tylenol  650 mg per orders. Infusion titrated per protocol. Pt tolerated infusion with no adverse reaction. AVS offered, but pt declined. Pt advised to schedule her next infusion appointment for 8 weeks at the front desk. Pt is alert, oriented, and ambulatory at discharge.

## 2023-12-21 ENCOUNTER — Other Ambulatory Visit: Payer: Self-pay

## 2023-12-21 DIAGNOSIS — K50818 Crohn's disease of both small and large intestine with other complication: Secondary | ICD-10-CM

## 2023-12-27 ENCOUNTER — Ambulatory Visit: Payer: Medicaid Other | Admitting: Family Medicine

## 2023-12-27 ENCOUNTER — Encounter: Payer: Self-pay | Admitting: Family Medicine

## 2023-12-27 DIAGNOSIS — H9201 Otalgia, right ear: Secondary | ICD-10-CM | POA: Diagnosis not present

## 2023-12-27 DIAGNOSIS — J309 Allergic rhinitis, unspecified: Secondary | ICD-10-CM | POA: Diagnosis not present

## 2023-12-27 DIAGNOSIS — M26621 Arthralgia of right temporomandibular joint: Secondary | ICD-10-CM | POA: Diagnosis not present

## 2024-01-05 ENCOUNTER — Ambulatory Visit: Payer: Medicaid Other | Admitting: Family Medicine

## 2024-02-21 ENCOUNTER — Non-Acute Institutional Stay (HOSPITAL_COMMUNITY)
Admission: RE | Admit: 2024-02-21 | Discharge: 2024-02-21 | Disposition: A | Discharge status: Home / Self Care | Source: Ambulatory Visit | Attending: Internal Medicine | Admitting: Internal Medicine

## 2024-02-21 DIAGNOSIS — K50818 Crohn's disease of both small and large intestine with other complication: Secondary | ICD-10-CM | POA: Diagnosis present

## 2024-02-21 LAB — CBC WITH DIFFERENTIAL/PLATELET
Abs Immature Granulocytes: 0.02 10*3/uL (ref 0.00–0.07)
Basophils Absolute: 0.1 10*3/uL (ref 0.0–0.1)
Basophils Relative: 1 %
Eosinophils Absolute: 0.3 10*3/uL (ref 0.0–0.5)
Eosinophils Relative: 4 %
HCT: 32.1 % — ABNORMAL LOW (ref 36.0–46.0)
Hemoglobin: 8.9 g/dL — ABNORMAL LOW (ref 12.0–15.0)
Immature Granulocytes: 0 %
Lymphocytes Relative: 38 %
Lymphs Abs: 2.7 10*3/uL (ref 0.7–4.0)
MCH: 21.1 pg — ABNORMAL LOW (ref 26.0–34.0)
MCHC: 27.7 g/dL — ABNORMAL LOW (ref 30.0–36.0)
MCV: 76.1 fL — ABNORMAL LOW (ref 80.0–100.0)
Monocytes Absolute: 0.6 10*3/uL (ref 0.1–1.0)
Monocytes Relative: 8 %
Neutro Abs: 3.6 10*3/uL (ref 1.7–7.7)
Neutrophils Relative %: 49 %
Platelets: 346 10*3/uL (ref 150–400)
RBC: 4.22 MIL/uL (ref 3.87–5.11)
RDW: 18 % — ABNORMAL HIGH (ref 11.5–15.5)
WBC: 7.3 10*3/uL (ref 4.0–10.5)
nRBC: 0 % (ref 0.0–0.2)

## 2024-02-21 LAB — COMPREHENSIVE METABOLIC PANEL WITH GFR
ALT: 11 U/L (ref 0–44)
AST: 20 U/L (ref 15–41)
Albumin: 3.6 g/dL (ref 3.5–5.0)
Alkaline Phosphatase: 61 U/L (ref 38–126)
Anion gap: 8 (ref 5–15)
BUN: 17 mg/dL (ref 6–20)
CO2: 21 mmol/L — ABNORMAL LOW (ref 22–32)
Calcium: 9.1 mg/dL (ref 8.9–10.3)
Chloride: 106 mmol/L (ref 98–111)
Creatinine, Ser: 0.85 mg/dL (ref 0.44–1.00)
GFR, Estimated: >60 mL/min (ref >60)
Glucose, Bld: 84 mg/dL (ref 70–99)
Potassium: 3.8 mmol/L (ref 3.5–5.1)
Sodium: 135 mmol/L (ref 135–145)
Total Bilirubin: 0.4 mg/dL (ref 0.0–1.2)
Total Protein: 8 g/dL (ref 6.5–8.1)

## 2024-02-21 LAB — SEDIMENTATION RATE: Sed Rate: 37 mm/h — ABNORMAL HIGH (ref 0–22)

## 2024-02-21 MED ORDER — SODIUM CHLORIDE 0.9 % IV SOLN: INTRAVENOUS | Status: DC | PRN

## 2024-02-21 MED ORDER — DIPHENHYDRAMINE HCL 25 MG PO CAPS
50.0000 mg | ORAL_CAPSULE | ORAL | Status: DC
  Administered 2024-02-21: 50 mg via ORAL
  Filled 2024-02-21: qty 2

## 2024-02-21 MED ORDER — ACETAMINOPHEN 325 MG PO TABS
650.0000 mg | ORAL_TABLET | ORAL | Status: DC
  Administered 2024-02-21: 650 mg via ORAL
  Filled 2024-02-21: qty 2

## 2024-02-21 MED ORDER — SODIUM CHLORIDE 0.9 % IV SOLN
10.0000 mg/kg | INTRAVENOUS | Status: DC
  Administered 2024-02-21: 700 mg via INTRAVENOUS
  Filled 2024-02-21: qty 70

## 2024-02-21 NOTE — Progress Notes (Signed)
 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 Patient's labs drawn peripherally prior to infusion. Patient to get labs (CBC w/diff, CMP, CRP, Sed Rate) drawn every 2 months per order. Patient received Remicade infusion (dose # 1 of 6) via PIV. Patient pre-medicated with 650 mg Tylenol and 50 mg Benadryl PO as ordered. Infusion titrated per protocol. Patient tolerated infusion well with no adverse reaction. Vital signs stable. Patient declined AVS and declined to stay for 1 hour post infusion observation. Patient to come back in 8 weeks for next infusion and will schedule the next appointment at the front desk. Patient alert, oriented and ambulatory at discharge.

## 2024-02-22 LAB — HIGH SENSITIVITY CRP: CRP, High Sensitivity: 0.3 mg/L (ref 0.00–3.00)

## 2024-02-28 ENCOUNTER — Other Ambulatory Visit: Payer: Self-pay

## 2024-02-28 DIAGNOSIS — D649 Anemia, unspecified: Secondary | ICD-10-CM

## 2024-02-28 DIAGNOSIS — K50818 Crohn's disease of both small and large intestine with other complication: Secondary | ICD-10-CM

## 2024-03-04 ENCOUNTER — Ambulatory Visit
Admission: EM | Admit: 2024-03-04 | Discharge: 2024-03-04 | Disposition: A | Discharge status: Home / Self Care | Attending: Internal Medicine | Admitting: Internal Medicine

## 2024-03-04 DIAGNOSIS — Z113 Encounter for screening for infections with a predominantly sexual mode of transmission: Secondary | ICD-10-CM | POA: Diagnosis not present

## 2024-03-04 DIAGNOSIS — H6991 Unspecified Eustachian tube disorder, right ear: Secondary | ICD-10-CM

## 2024-03-04 DIAGNOSIS — N3000 Acute cystitis without hematuria: Secondary | ICD-10-CM | POA: Diagnosis not present

## 2024-03-04 DIAGNOSIS — Z3202 Encounter for pregnancy test, result negative: Secondary | ICD-10-CM | POA: Diagnosis not present

## 2024-03-04 LAB — POCT URINALYSIS DIP (MANUAL ENTRY)
Bilirubin, UA: NEGATIVE
Glucose, UA: NEGATIVE mg/dL
Ketones, POC UA: NEGATIVE mg/dL
Nitrite, UA: NEGATIVE
Spec Grav, UA: 1.02 (ref 1.010–1.025)
Urobilinogen, UA: 0.2 U/dL
pH, UA: 7 (ref 5.0–8.0)

## 2024-03-04 LAB — POCT URINE PREGNANCY: Preg Test, Ur: NEGATIVE

## 2024-03-04 MED ORDER — FLUCONAZOLE 150 MG PO TABS: 150.0000 mg | ORAL_TABLET | ORAL | 0 refills | Status: DC

## 2024-03-04 MED ORDER — CEPHALEXIN 500 MG PO CAPS
500.0000 mg | ORAL_CAPSULE | Freq: Two times a day (BID) | ORAL | 0 refills | Status: AC
Start: 2024-03-04 — End: 2024-03-11

## 2024-03-04 NOTE — Discharge Instructions (Addendum)
 Your urine shows you likely have a urinary tract infection.  I have sent your urine for culture to confirm this.  We will call you if we need to change your antibiotic when we find out the type of bacteria growing in your bladder.  Take antibiotic as directed with a snack/food to avoid stomach upset. To avoid GI upset please take this medication with food.   Avoid drinking beverages that irritate the urinary tract like sodas, tea, coffee, or juice. Drink plenty of water to stay well hydrated and prevent severe infection.  I suspect that your  symptoms are due to suspected yeast infection to the vagina.  Take medication as prescribed.  Your swab has been sent for testing, staff will call you in 2-3 days if you test positive for any other infections and will provide treatment at that time.  Return to urgent care as needed and follow-up with your primary care provider for further evaluation and management of your symptoms..  I hope you feel better!

## 2024-03-04 NOTE — ED Triage Notes (Signed)
"  I also have pain in my right ear".

## 2024-03-04 NOTE — ED Provider Notes (Signed)
 [name]    [name]: [id number] Arrival date & time: 03/04/24  0929      History   Chief Complaint Chief Complaint  Patient presents with   SEXUALLY TRANSMITTED DISEASE    Testing   UTI Symptoms   Otalgia    HPI Erika Stephens is a 33 y.o. female.   Erika Stephens is a 33 y.o. female presenting for chief complaint of possible STD.  Sh was sexually active with new female partner 1 week ago and started having urinary frequency and dysuria a few days later. Vaginal itching started 2-3 days ago. Reports vaginal discharge that is "cottage cheese" in appearance.  Denies vaginal odor and known exposure to STD. Denies flank pain, nausea, vomiting, diarrhea, abdominal pain, low back pain, dizziness, headache, and fever/chills. She had some old MetroGel  and attempted using this to her vaginal region but states this may have made symptoms worse. She is additionally using cranberry juice and drinking lots of water to try to help with urinary symptoms with some relief.  States urine is still cloudy.  She additionally reports right ear pain that started 2 to 3 days ago.  History of chronic right ear pain, states she has seen ear nose and throat multiple times most recently 1 month ago where they cleaned out her right ear and this improved the pain.  History of allergies, taking Flonase  without any relief of ear pain. Denies drainage from the ear, tinnitus, dizziness, fever, chills, and viral URI symptoms.   Otalgia   Past Medical History:  Diagnosis Date   Anemia    Anxiety    Blood transfusion without reported diagnosis    Central centrifugal scarring alopecia    Chronic swimmer's ear of right side    Crohn's colitis (HCC)    IDA (iron deficiency anemia)    Impetigo    Postinflammatory hyperpigmentation    Pre-eclampsia in postpartum period 01/14/2019   Scalp psoriasis    Seborrheic dermatitis of scalp     Patient Active Problem List   Diagnosis Date Noted   Right ear pain  12/27/2023   Arthralgia of right temporomandibular joint 12/27/2023   Allergic rhinitis 12/27/2023   Dysuria 01/11/2023   Anorexia 07/03/2022   Nausea 07/03/2022   Unintentional weight loss 07/03/2022   Anxiety disorder 04/03/2021   Bipolar disorder (HCC) 04/03/2021   Hemorrhoids 12/26/2020   Chest pain of uncertain etiology 12/25/2020   Essential hypertension 12/25/2020   History of pre-eclampsia 12/25/2020   ASCUS of cervix with negative high risk HPV 11/27/2020   Iron deficiency anemia 11/14/2020   Fecal urgency 10/16/2020   Nasal sore 10/16/2020   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   Crohn's disease of both small and large intestine with other complication (HCC) 03/08/2020   History of iron deficiency anemia 03/08/2020   Infliximab  (Remicade ) long-term use 03/08/2020   History of immunosuppression therapy 03/08/2020   Generalized abdominal pain 03/08/2020   Bloating symptom 03/08/2020   Chronic diarrhea 03/08/2020   Pre-eclampsia in postpartum period 01/14/2019   NSVD (normal spontaneous vaginal delivery) 12/28/2018   Cyst of ovary 12/21/2016   Crohn's disease (HCC) 11/27/2015   Anal pain 09/11/2013   Skin rash 09/11/2013   Immunosuppression (HCC) 06/07/2013    Past Surgical History:  Procedure Laterality Date   APPENDECTOMY     BOWEL RESECTION     COLONOSCOPY     UPPER GASTROINTESTINAL ENDOSCOPY      OB History   No obstetric history on  file.      Home Medications    Prior to Admission medications   Medication Sig Start Date End Date Taking? Authorizing Provider  cephALEXin  (KEFLEX ) 500 MG capsule Take 1 capsule (500 mg total) by mouth 2 (two) times daily for 7 days. 03/04/24 03/11/24 Yes StanhopeDanny Dye, FNP  fluconazole  (DIFLUCAN ) 150 MG tablet Take 1 tablet (150 mg total) by mouth every 3 (three) days. 03/04/24  Yes Starlene Eaton, FNP  fluticasone  (FLONASE ) 50 MCG/ACT nasal spray Place 1 spray into both nostrils daily.   Yes  [provider]  inFLIXimab  (REMICADE  IV) Inject into the vein. Every 8 weeks   Yes [provider]  triamcinolone  lotion (KENALOG ) 0.1 % Apply 1 Application topically 2 (two) times daily. 06/10/20  Yes [provider]  clobetasol  cream (TEMOVATE ) 0.05 % SMARTSIG:Sparingly Topical Twice Daily 05/06/23   [provider]  etonogestrel -ethinyl estradiol  (NUVARING) 0.12-0.015 MG/24HR vaginal ring Insert vaginally and leave in place for 3 consecutive weeks, then remove for 1 week. 11/24/23   Abraham Abo, MD  ferrous sulfate  325 (65 FE) MG tablet Take 1 tablet (325 mg total) by mouth daily with breakfast. 09/16/23   Abraham Abo, MD  FLUoxetine  (PROZAC ) 20 MG capsule Take 1 capsule (20 mg total) by mouth daily. 09/09/23   Abraham Abo, MD  hydrOXYzine  (VISTARIL ) 25 MG capsule Take 1 capsule (25 mg total) by mouth every 8 (eight) hours as needed. 08/25/23   Senaida Dama, NP  oseltamivir (TAMIFLU) 75 MG capsule Take 75 mg by mouth 2 (two) times daily. 05/20/23   [provider]  promethazine -dextromethorphan (PROMETHAZINE -DM) 6.25-15 MG/5ML syrup Take 5 mLs by mouth every 4 (four) hours as needed. 05/20/23   [provider]  SUMAtriptan  (IMITREX ) 25 MG tablet Take 25 mg (1 tablet total) by mouth at the start of the headache. May repeat in 2 hours x 1 if headache persists. Max of 2 tablets/24 hours. 08/25/23   Senaida Dama, NP  triamcinolone  ointment (KENALOG ) 0.1 % Apply topically. 02/27/20   [provider]    Family History Family History  Problem Relation Age of Onset   Crohn's disease Mother    Schizophrenia Father    Bipolar disorder Father    Crohn's disease Maternal Grandmother    Prostate cancer Maternal Uncle    Colon cancer Neg Hx    Stomach cancer Neg Hx    Pancreatic cancer Neg Hx    Esophageal cancer Neg Hx    Inflammatory bowel disease Neg Hx    Liver disease Neg Hx    Rectal cancer Neg Hx    Breast cancer Neg Hx      Social History Social History   Tobacco Use   Smoking status: Never    Passive exposure: Never   Smokeless tobacco: Never  Vaping Use   Vaping status: Never Used  Substance Use Topics   Alcohol use: Yes    Comment: socially.    Drug use: Not Currently    Types: Marijuana    Comment: 08-10-22 last used per pt     Allergies   Adalimumab   Review of Systems Review of Systems  HENT:  Positive for ear pain.   Per HPI   Physical Exam Triage Vital Signs ED Triage Vitals  Encounter Vitals Group     BP 03/04/24 0946 103/67     Systolic BP Percentile --      Diastolic BP Percentile --      Pulse  Rate 03/04/24 0946 79     Resp 03/04/24 0946 16     Temp 03/04/24 0946 98.4 F (36.9 C)     Temp Source 03/04/24 0946 Oral     SpO2 03/04/24 0946 97 %     Weight 03/04/24 0943 151 lb (68.5 kg)     Height 03/04/24 0943 5' 2.5" (1.588 m)     Head Circumference --      Peak Flow --      Pain Score 03/04/24 0933 0     Pain Loc --      Pain Education --      Exclude from Growth Chart --    No data found.  Updated Vital Signs BP 103/67 (BP Location: Left Arm)   Pulse 79   Temp 98.4 F (36.9 C) (Oral)   Resp 16   Ht 5' 2.5" (1.588 m)   Wt 151 lb (68.5 kg)   LMP 02/01/2024 (Exact Date)   SpO2 97%   BMI 27.18 kg/m   Visual Acuity Right Eye Distance:   Left Eye Distance:   Bilateral Distance:    Right Eye Near:   Left Eye Near:    Bilateral Near:     Physical Exam Vitals and nursing note reviewed.  Constitutional:      Appearance: She is not ill-appearing or toxic-appearing.  HENT:     Head: Normocephalic and atraumatic.     Right Ear: Hearing, tympanic membrane, ear canal and external ear normal.     Left Ear: Hearing, tympanic membrane, ear canal and external ear normal.     Nose: Nose normal.     Mouth/Throat:     Lips: Pink.     Mouth: Mucous membranes are moist. No injury or oral lesions.     Dentition: Normal dentition.     Tongue: No lesions.      Pharynx: Oropharynx is clear. Uvula midline. No pharyngeal swelling, oropharyngeal exudate, posterior oropharyngeal erythema, uvula swelling or postnasal drip.     Tonsils: No tonsillar exudate.  Eyes:     General: Lids are normal. Vision grossly intact. Gaze aligned appropriately.     Extraocular Movements: Extraocular movements intact.     Conjunctiva/sclera: Conjunctivae normal.  Neck:     Trachea: Trachea and phonation normal.  Cardiovascular:     Rate and Rhythm: Normal rate and regular rhythm.     Heart sounds: Normal heart sounds, S1 normal and S2 normal.  Pulmonary:     Effort: Pulmonary effort is normal. No respiratory distress.     Breath sounds: Normal breath sounds and air entry.  Abdominal:     General: Bowel sounds are normal.     Palpations: Abdomen is soft.     Tenderness: There is no abdominal tenderness. There is no right CVA tenderness, left CVA tenderness or guarding.  Genitourinary:    Comments: Deferred. Musculoskeletal:     Cervical back: Neck supple.  Lymphadenopathy:     Cervical: No cervical adenopathy.  Skin:    General: Skin is warm and dry.     Capillary Refill: Capillary refill takes less than 2 seconds.     Findings: No rash.  Neurological:     General: No focal deficit present.     Mental Status: She is alert and oriented to person, place, and time. Mental status is at baseline.     Cranial Nerves: No dysarthria or facial asymmetry.  Psychiatric:        Mood and Affect: Mood normal.  Speech: Speech normal.        Behavior: Behavior normal.        Thought Content: Thought content normal.        Judgment: Judgment normal.      UC Treatments / Results  Labs (all labs ordered are listed, but only abnormal results are displayed) Labs Reviewed  POCT URINALYSIS DIP (MANUAL ENTRY) - Abnormal; Notable for the following components:      Result Value   Blood, UA small (*)    Protein Ur, POC trace (*)    Leukocytes, UA Trace (*)    All  other components within normal limits  URINE CULTURE  RPR  HIV ANTIBODY (ROUTINE TESTING W REFLEX)  POCT URINE PREGNANCY  CERVICOVAGINAL ANCILLARY ONLY    EKG   Radiology No results found.  Procedures Procedures (including critical care time)  Medications Ordered in UC Medications - No data to display  Initial Impression / Assessment and Plan / UC Course  I have reviewed the triage vital signs and the nursing notes.  Pertinent labs & imaging results that were available during my care of the patient were reviewed by me and considered in my medical decision making (see chart for details).   1.  Acute cystitis without hematuria Evaluation suggests acute uncomplicated cystitis.  Urine culture pending.  Low suspicion for acute pyelonephritis, kidney stone or infected stone.  Keflex  antibiotic ordered. Appears well hydrated, therefore will defer labs/imaging.  Patient to push fluids to stay well hydrated and reduce intake of known urinary irritants.  Discussed methods of preventing future UTI.   2. Screening for STD, negative pregnancy test High clinical suspicion for acute yeast vaginitis, therefore will treat with diflucan  empirically.  Vaginal swab pending, will treat for other positive infections per protocol when labs result. Discussed tips to prevent recurrent yeast vaginitis infections. Safe sex precautions discussed.  Negative pregnancy test.    3.  Chronic dysfunction of right eustachian tube Presentation consistent with eustachian tube dysfunction.  No signs of otitis media, TMJ, injury.  Flonase  and oral antihistamine recommended. Tylenol  as needed for pain.   Counseled patient on potential for adverse effects with medications prescribed/recommended today, strict ER and return-to-clinic precautions discussed, patient verbalized understanding.    Final Clinical Impressions(s) / UC Diagnoses   Final diagnoses:  Screening for STD (sexually transmitted disease)   Acute cystitis without hematuria  Negative pregnancy test  Chronic dysfunction of right eustachian tube     Discharge Instructions      Your urine shows you likely have a urinary tract infection.  I have sent your urine for culture to confirm this.  We will call you if we need to change your antibiotic when we find out the type of bacteria growing in your bladder.  Take antibiotic as directed with a snack/food to avoid stomach upset. To avoid GI upset please take this medication with food.   Avoid drinking beverages that irritate the urinary tract like sodas, tea, coffee, or juice. Drink plenty of water to stay well hydrated and prevent severe infection.  I suspect that your  symptoms are due to suspected yeast infection to the vagina.  Take medication as prescribed.  Your swab has been sent for testing, staff will call you in 2-3 days if you test positive for any other infections and will provide treatment at that time.  Return to urgent care as needed and follow-up with your primary care provider for further evaluation and management of your symptoms.Aaron Aas  I hope you feel better!       ED Prescriptions     Medication Sig Dispense Auth. Provider   fluconazole  (DIFLUCAN ) 150 MG tablet Take 1 tablet (150 mg total) by mouth every 3 (three) days. 2 tablet Starlene Eaton, FNP   cephALEXin  (KEFLEX ) 500 MG capsule Take 1 capsule (500 mg total) by mouth 2 (two) times daily for 7 days. 14 capsule Starlene Eaton, FNP      PDMP not reviewed this encounter.   Starlene Eaton, [place] 03/04/24 1007

## 2024-03-04 NOTE — ED Triage Notes (Signed)
"  I am coming in for recent intercourse about 2 wks ago due to unprotected sex, I then started with frequency urination, extreme irritation, vaginal discharge, no rash". "My home girl was diagnosed with HIV I/II and she wanted me to make sure I told the provider".

## 2024-03-05 LAB — URINE CULTURE: Culture: <10000 — AB

## 2024-03-05 LAB — HIV ANTIBODY (ROUTINE TESTING W REFLEX): HIV Screen 4th Generation wRfx: NONREACTIVE

## 2024-03-05 LAB — RPR: RPR Ser Ql: NONREACTIVE

## 2024-03-06 LAB — CERVICOVAGINAL ANCILLARY ONLY
Bacterial Vaginitis (gardnerella): POSITIVE — AB
Candida Glabrata: NEGATIVE
Candida Vaginitis: POSITIVE — AB
Chlamydia: NEGATIVE
Comment: NEGATIVE
Comment: NEGATIVE
Comment: NEGATIVE
Comment: NEGATIVE
Comment: NEGATIVE
Comment: NORMAL
Neisseria Gonorrhea: NEGATIVE
Trichomonas: NEGATIVE

## 2024-03-07 ENCOUNTER — Telehealth (HOSPITAL_COMMUNITY): Payer: Self-pay

## 2024-03-07 MED ORDER — METRONIDAZOLE 500 MG PO TABS: 500.0000 mg | ORAL_TABLET | Freq: Two times a day (BID) | ORAL | 0 refills | Status: AC

## 2024-03-07 NOTE — Telephone Encounter (Signed)
 Per protocol, pt requires tx with metronidazole. Rx sent to pharmacy on file.

## 2024-04-07 ENCOUNTER — Other Ambulatory Visit: Payer: Self-pay | Admitting: Family Medicine

## 2024-04-07 ENCOUNTER — Other Ambulatory Visit: Payer: Self-pay | Admitting: Family

## 2024-04-07 ENCOUNTER — Telehealth: Payer: Self-pay

## 2024-04-07 DIAGNOSIS — R3121 Asymptomatic microscopic hematuria: Secondary | ICD-10-CM

## 2024-04-07 NOTE — Telephone Encounter (Signed)
 Copied from CRM [id number]. Topic: Clinical - Medication Refill >> Apr 07, 2024 11:34 AM Ivette P wrote: Medication: etonogestrel -ethinyl estradiol  (NUVARING) 0.12-0.015 MG/24HR vaginal ring  Has the patient contacted their pharmacy? No (Agent: If no, request that the patient contact the pharmacy for the refill. If patient does not wish to contact the pharmacy document the reason why and proceed with request.) (Agent: If yes, when and what did the pharmacy advise?)  This is the patient's preferred pharmacy:  [facility] [address] [place] - [address] AT [address] [place] [address] Phone: [phone] Fax: [phone]  Is this the correct pharmacy for this prescription? Yes If no, delete pharmacy and type the correct one.   Has the prescription been filled recently? No, last fill 11/24/2023  Is the patient out of the medication? Yes  Has the patient been seen for an appointment in the last year OR does the patient have an upcoming appointment? Yes, 11/30/2023  Can we respond through MyChart? Yes  Agent: Please be advised that Rx refills may take up to 3 business days. We ask that you follow-up with your pharmacy.

## 2024-04-07 NOTE — Telephone Encounter (Signed)
 Specialist to order Remicade  infusion.

## 2024-04-07 NOTE — Telephone Encounter (Signed)
 Error

## 2024-04-07 NOTE — Telephone Encounter (Signed)
 Complete

## 2024-04-07 NOTE — Telephone Encounter (Signed)
 Copied from CRM [id number]. Topic: Referral - Question >> Apr 07, 2024 11:33 AM Erika Stephens wrote: Reason for CRM: Pt would like to know if referral to urology could be resent because her previous referral was timed out due to not being able to make app due to transportation.   Pls follow up with pt [phone]

## 2024-04-07 NOTE — Telephone Encounter (Signed)
 Called patient and she is aware that we are working on on getting her scheduled. Once everything is done ill give patient a updated call

## 2024-04-07 NOTE — Telephone Encounter (Signed)
 Spoke with patient .Patient voiced that she was told that her PCP has to schedule her Remicade  infusion. Spoke Forensic psychologist at [facility]. Advised patient had called to schedule her appointment for her infusion and was unable to . Advised that the infusion was not ordered by her primary and asked what needed to be done to help patient get this scheduled. advised by Ameerha that the clinic is closed for lunch . She would get the message to the clinic and have the clinic call patient.

## 2024-04-07 NOTE — Telephone Encounter (Signed)
Called patient and she is aware

## 2024-04-07 NOTE — Telephone Encounter (Signed)
 Copied from CRM [id number]. Topic: Appointments - Scheduling Inquiry for Clinic >> Apr 07, 2024 11:32 AM Felicitas Horse wrote: Reason for CRM: Pt called in stating her nurse told her that Nurse would schedule  remicade  infusion and pt calling in to get scheduled for next month   Pt callback [phone]

## 2024-04-10 ENCOUNTER — Telehealth: Payer: Self-pay | Admitting: Family Medicine

## 2024-04-10 MED ORDER — ETONOGESTREL-ETHINYL ESTRADIOL 0.12-0.015 MG/24HR VA RING: VAGINAL_RING | VAGINAL | 1 refills | Status: DC

## 2024-04-10 NOTE — Telephone Encounter (Signed)
 Requested Prescriptions  Pending Prescriptions Disp Refills   etonogestrel -ethinyl estradiol  (NUVARING) 0.12-0.015 MG/24HR vaginal ring 3 each 1    Sig: Insert vaginally and leave in place for 3 consecutive weeks, then remove for 1 week.     OB/GYN:  Contraceptives Passed - 04/10/2024  7:47 AM      Passed - Last BP in normal range    BP Readings from Last 1 Encounters:  03/04/24 103/67         Passed - Valid encounter within last 12 months    Recent Outpatient Visits           4 months ago Allergic reaction, initial encounter   [facility], [place]   6 months ago Annual physical exam   [facility], MD   7 months ago Anxiety and depression   [facility], MD   7 months ago Asymptomatic microscopic hematuria   [facility] [place], NP   9 months ago Asymptomatic microscopic hematuria   [place] [facility], MD              Passed - Patient is not a smoker

## 2024-04-10 NOTE — Telephone Encounter (Signed)
 Patient has appointment schedule 04/18/24 @ 9 AM

## 2024-04-10 NOTE — Telephone Encounter (Signed)
 Copied from CRM [id number]. Topic: Appointments - Scheduling Inquiry for Clinic >> Apr 07, 2024 12:30 PM Stanly Early wrote: Reason for CRM: Caller(Casandra) stated the patient tried to book an appointment for remicade  infusion/injections at gastroenterology office. The office stated she wont be able to and it needs to be done by provider. The provider did not order the medication. Call the patient to schedule [phone]. Patient stated she has problems every time she goes to schedule this appointment. [phone](community care primary at [facility])

## 2024-04-18 ENCOUNTER — Other Ambulatory Visit: Payer: Self-pay

## 2024-04-18 ENCOUNTER — Non-Acute Institutional Stay (HOSPITAL_COMMUNITY)
Admission: RE | Admit: 2024-04-18 | Discharge: 2024-04-18 | Disposition: A | Discharge status: Home / Self Care | Source: Ambulatory Visit | Attending: Internal Medicine | Admitting: Internal Medicine

## 2024-04-18 ENCOUNTER — Ambulatory Visit: Payer: Self-pay | Admitting: Gastroenterology

## 2024-04-18 VITALS — BP 108/66 | HR 69 | Temp 98.5°F | Resp 16 | Wt 149.0 lb

## 2024-04-18 DIAGNOSIS — D649 Anemia, unspecified: Secondary | ICD-10-CM | POA: Insufficient documentation

## 2024-04-18 DIAGNOSIS — K50818 Crohn's disease of both small and large intestine with other complication: Secondary | ICD-10-CM | POA: Diagnosis present

## 2024-04-18 LAB — CBC WITH DIFFERENTIAL/PLATELET
Abs Immature Granulocytes: 0.03 10*3/uL (ref 0.00–0.07)
Basophils Absolute: 0.1 10*3/uL (ref 0.0–0.1)
Basophils Relative: 1 %
Eosinophils Absolute: 0.2 10*3/uL (ref 0.0–0.5)
Eosinophils Relative: 2 %
HCT: 29 % — ABNORMAL LOW (ref 36.0–46.0)
Hemoglobin: 8.4 g/dL — ABNORMAL LOW (ref 12.0–15.0)
Immature Granulocytes: 0 %
Lymphocytes Relative: 32 %
Lymphs Abs: 2.4 10*3/uL (ref 0.7–4.0)
MCH: 21.2 pg — ABNORMAL LOW (ref 26.0–34.0)
MCHC: 29 g/dL — ABNORMAL LOW (ref 30.0–36.0)
MCV: 73 fL — ABNORMAL LOW (ref 80.0–100.0)
Monocytes Absolute: 0.6 10*3/uL (ref 0.1–1.0)
Monocytes Relative: 8 %
Neutro Abs: 4.2 10*3/uL (ref 1.7–7.7)
Neutrophils Relative %: 57 %
Platelets: 327 10*3/uL (ref 150–400)
RBC: 3.97 MIL/uL (ref 3.87–5.11)
RDW: 18.1 % — ABNORMAL HIGH (ref 11.5–15.5)
WBC: 7.3 10*3/uL (ref 4.0–10.5)
nRBC: 0 % (ref 0.0–0.2)

## 2024-04-18 LAB — SEDIMENTATION RATE: Sed Rate: 30 mm/h — ABNORMAL HIGH (ref 0–22)

## 2024-04-18 MED ORDER — DIPHENHYDRAMINE HCL 25 MG PO CAPS
50.0000 mg | ORAL_CAPSULE | Freq: Once | ORAL | Status: AC
  Administered 2024-04-18: 50 mg via ORAL
  Filled 2024-04-18: qty 2

## 2024-04-18 MED ORDER — ACETAMINOPHEN 325 MG PO TABS
650.0000 mg | ORAL_TABLET | Freq: Once | ORAL | Status: AC
  Administered 2024-04-18: 650 mg via ORAL
  Filled 2024-04-18: qty 2

## 2024-04-18 MED ORDER — SODIUM CHLORIDE 0.9 % IV SOLN: INTRAVENOUS | Status: DC | PRN

## 2024-04-18 MED ORDER — SODIUM CHLORIDE 0.9 % IV SOLN
10.0000 mg/kg | INTRAVENOUS | Status: DC
  Administered 2024-04-18: 700 mg via INTRAVENOUS
  Filled 2024-04-18: qty 70

## 2024-04-18 NOTE — Progress Notes (Signed)
 PATIENT CARE CENTER NOTE  Diagnosis:  Crohn's disease of both small and large intestine with other complication (HCC) (Z61.096)    Provider: Yong Henle,  MD   Procedure: Remicade  infusion 700 mg and lab work (dose # 2 of 6)   Note: Patient received Remicade  infusion via PIV. Pts ordered labs (CBC w/diff, CMP, CRP, Sed rate) drawn peripherally prior to infusion. Pt pre-medicated with PO Tylenol  and Benadryl  per orders. Infusion titrated per protocol. Pt tolerated the infusion with no adverse reaction. Vital signs are stable. AVS offered, but pt declined. Pt advised to schedule her next infusion for 8 weeks, and pt states that she will call to schedule. Pt is alert, oriented, and ambulatory at discharge.

## 2024-04-19 ENCOUNTER — Other Ambulatory Visit: Payer: Self-pay

## 2024-04-19 ENCOUNTER — Encounter (HOSPITAL_COMMUNITY): Payer: Self-pay

## 2024-04-19 ENCOUNTER — Emergency Department (HOSPITAL_COMMUNITY)
Admission: EM | Admit: 2024-04-19 | Discharge: 2024-04-20 | Disposition: A | Discharge status: Other Acute Inpatient Hospital | Attending: Emergency Medicine | Admitting: Emergency Medicine

## 2024-04-19 DIAGNOSIS — E876 Hypokalemia: Secondary | ICD-10-CM | POA: Diagnosis not present

## 2024-04-19 DIAGNOSIS — F419 Anxiety disorder, unspecified: Secondary | ICD-10-CM | POA: Insufficient documentation

## 2024-04-19 DIAGNOSIS — O99321 Drug use complicating pregnancy, first trimester: Secondary | ICD-10-CM | POA: Diagnosis not present

## 2024-04-19 DIAGNOSIS — O99341 Other mental disorders complicating pregnancy, first trimester: Secondary | ICD-10-CM | POA: Insufficient documentation

## 2024-04-19 DIAGNOSIS — F319 Bipolar disorder, unspecified: Secondary | ICD-10-CM | POA: Insufficient documentation

## 2024-04-19 DIAGNOSIS — Y904 Blood alcohol level of 80-99 mg/100 ml: Secondary | ICD-10-CM | POA: Diagnosis not present

## 2024-04-19 DIAGNOSIS — I10 Essential (primary) hypertension: Secondary | ICD-10-CM | POA: Diagnosis not present

## 2024-04-19 DIAGNOSIS — R451 Restlessness and agitation: Secondary | ICD-10-CM | POA: Diagnosis not present

## 2024-04-19 DIAGNOSIS — F1910 Other psychoactive substance abuse, uncomplicated: Secondary | ICD-10-CM | POA: Insufficient documentation

## 2024-04-19 DIAGNOSIS — O99281 Endocrine, nutritional and metabolic diseases complicating pregnancy, first trimester: Secondary | ICD-10-CM | POA: Insufficient documentation

## 2024-04-19 LAB — COMPREHENSIVE METABOLIC PANEL WITH GFR
ALT: 14 U/L (ref 0–44)
AST: 22 U/L (ref 15–41)
Albumin: 4.1 g/dL (ref 3.5–5.0)
Alkaline Phosphatase: 60 U/L (ref 38–126)
Anion gap: 11 (ref 5–15)
BUN: 9 mg/dL (ref 6–20)
CO2: 18 mmol/L — ABNORMAL LOW (ref 22–32)
Calcium: 9.2 mg/dL (ref 8.9–10.3)
Chloride: 106 mmol/L (ref 98–111)
Creatinine, Ser: 0.77 mg/dL (ref 0.44–1.00)
GFR, Estimated: >60 mL/min (ref >60)
Glucose, Bld: 89 mg/dL (ref 70–99)
Potassium: 2.9 mmol/L — ABNORMAL LOW (ref 3.5–5.1)
Sodium: 135 mmol/L (ref 135–145)
Total Bilirubin: 0.6 mg/dL (ref 0.0–1.2)
Total Protein: 8.7 g/dL — ABNORMAL HIGH (ref 6.5–8.1)

## 2024-04-19 LAB — CBC WITH DIFFERENTIAL/PLATELET
Abs Immature Granulocytes: 0.04 10*3/uL (ref 0.00–0.07)
Basophils Absolute: 0.1 10*3/uL (ref 0.0–0.1)
Basophils Relative: 1 %
Eosinophils Absolute: 0.2 10*3/uL (ref 0.0–0.5)
Eosinophils Relative: 2 %
HCT: 31.5 % — ABNORMAL LOW (ref 36.0–46.0)
Hemoglobin: 9.2 g/dL — ABNORMAL LOW (ref 12.0–15.0)
Immature Granulocytes: 0 %
Lymphocytes Relative: 31 %
Lymphs Abs: 3.4 10*3/uL (ref 0.7–4.0)
MCH: 21.6 pg — ABNORMAL LOW (ref 26.0–34.0)
MCHC: 29.2 g/dL — ABNORMAL LOW (ref 30.0–36.0)
MCV: 73.9 fL — ABNORMAL LOW (ref 80.0–100.0)
Monocytes Absolute: 0.8 10*3/uL (ref 0.1–1.0)
Monocytes Relative: 8 %
Neutro Abs: 6.6 10*3/uL (ref 1.7–7.7)
Neutrophils Relative %: 58 %
Platelets: 412 10*3/uL — ABNORMAL HIGH (ref 150–400)
RBC: 4.26 MIL/uL (ref 3.87–5.11)
RDW: 17.7 % — ABNORMAL HIGH (ref 11.5–15.5)
WBC: 11.1 10*3/uL — ABNORMAL HIGH (ref 4.0–10.5)
nRBC: 0 % (ref 0.0–0.2)

## 2024-04-19 LAB — RAPID URINE DRUG SCREEN, HOSP PERFORMED
Amphetamines: NOT DETECTED
Barbiturates: NOT DETECTED
Benzodiazepines: NOT DETECTED
Cocaine: POSITIVE — AB
Opiates: NOT DETECTED
Tetrahydrocannabinol: POSITIVE — AB

## 2024-04-19 LAB — ETHANOL: Alcohol, Ethyl (B): 80 mg/dL — ABNORMAL HIGH (ref <15)

## 2024-04-19 LAB — SALICYLATE LEVEL: Salicylate Lvl: <7 mg/dL — ABNORMAL LOW (ref 7.0–30.0)

## 2024-04-19 LAB — HIGH SENSITIVITY CRP: CRP, High Sensitivity: 0.66 mg/L (ref 0.00–3.00)

## 2024-04-19 LAB — HCG, SERUM, QUALITATIVE: Preg, Serum: POSITIVE — AB

## 2024-04-19 MED ORDER — ACETAMINOPHEN 500 MG PO TABS: 1000.0000 mg | ORAL_TABLET | Freq: Once | ORAL | Status: DC

## 2024-04-19 MED ORDER — MIDAZOLAM HCL 2 MG/2ML IJ SOLN
5.0000 mg | Freq: Once | INTRAMUSCULAR | Status: AC
  Administered 2024-04-19: 5 mg via INTRAMUSCULAR
  Filled 2024-04-19: qty 6

## 2024-04-19 MED ORDER — FLUOXETINE HCL 20 MG PO CAPS
20.0000 mg | ORAL_CAPSULE | Freq: Every day | ORAL | Status: DC
  Administered 2024-04-20: 20 mg via ORAL
  Filled 2024-04-19: qty 1

## 2024-04-19 MED ORDER — POTASSIUM CHLORIDE CRYS ER 20 MEQ PO TBCR
60.0000 meq | EXTENDED_RELEASE_TABLET | Freq: Once | ORAL | Status: AC
  Administered 2024-04-19: 60 meq via ORAL
  Filled 2024-04-19: qty 3

## 2024-04-19 NOTE — ED Triage Notes (Signed)
 [employer] reports they got call from the grandmother or mother stating the patient dropped her kids off at their house and they patient went to the [employer] store. [employer] tried to meet the patient at the [employer] store and other officers went to the home. [employer] checked the home and noone was there, front and back door where open. Per family, she made some comments about not wanting to live, wanting God to just take her. [employer] realizes the patient is in the apartment. Counselors were also on scene. Patient got upset when she saw [employer] officers. Patient tells counselor she is [redacted] weeks pregnant. Family does not know she is pregnant-and patient does not want them to know. Patient is drinking liquor while pregnant. Patient states she is worried about losing her housing. 2 additional counselors arrive on scene and mention the patient was driving erratically. Patient is visibly upset when arriving to the ER.  Counselor states the [employer], patient's psychiatric provider did the IVC. They can be reached at [phone]. Counselors states the patient dropped her kids off her grandmothers house, stated she did not want to live anymore and left. [employer] and [facility] did not think she needed the IVC. However the [employer] thought she did.

## 2024-04-19 NOTE — ED Provider Notes (Signed)
 [place] EMERGENCY DEPARTMENT AT [facility] Provider Note  [name]: [id number] Arrival date & time: 04/19/24 2049  Chief Complaint(s) Suicidal  HPI Erika Stephens is a 33 y.o. female who is here today after her counselor filled out an IVC.  Reportedly, [employer] got a call from the mother of the patient stating that the patient dropped her children off at their house and the patient went to a liquor store.  Parents were concerned about how the patient seemed.  She reportedly made a comment about not wanting to live, and wanting God to just take her.    They called police.  Counselors also arrived.  Patient was intoxicated when they arrived.  Patient is [redacted] weeks pregnant.  Patient endorses using cocaine, marijuana and alcohol to me.   Past Medical History Past Medical History:  Diagnosis Date   Anemia    Anxiety    Blood transfusion without reported diagnosis    Central centrifugal scarring alopecia    Chronic swimmer's ear of right side    Crohn's colitis (HCC)    IDA (iron deficiency anemia)    Impetigo    Postinflammatory hyperpigmentation    Pre-eclampsia in postpartum period 01/14/2019   Scalp psoriasis    Seborrheic dermatitis of scalp    Patient Active Problem List   Diagnosis Date Noted   Anemia 04/18/2024   Right ear pain 12/27/2023   Arthralgia of right temporomandibular joint 12/27/2023   Allergic rhinitis 12/27/2023   Dysuria 01/11/2023   Anorexia 07/03/2022   Nausea 07/03/2022   Unintentional weight loss 07/03/2022   Anxiety disorder 04/03/2021   Bipolar disorder (HCC) 04/03/2021   Hemorrhoids 12/26/2020   Chest pain of uncertain etiology 12/25/2020   Essential hypertension 12/25/2020   History of pre-eclampsia 12/25/2020   ASCUS of cervix with negative high risk HPV 11/27/2020   Iron deficiency anemia 11/14/2020   Fecal urgency 10/16/2020   Nasal sore 10/16/2020   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   Crohn's disease of both small and large  intestine with other complication (HCC) 03/08/2020   History of iron deficiency anemia 03/08/2020   Infliximab  (Remicade ) long-term use 03/08/2020   History of immunosuppression therapy 03/08/2020   Generalized abdominal pain 03/08/2020   Bloating symptom 03/08/2020   Chronic diarrhea 03/08/2020   Pre-eclampsia in postpartum period 01/14/2019   NSVD (normal spontaneous vaginal delivery) 12/28/2018   Cyst of ovary 12/21/2016   Crohn's disease (HCC) 11/27/2015   Anal pain 09/11/2013   Skin rash 09/11/2013   Immunosuppression (HCC) 06/07/2013   Home Medication(s) Prior to Admission medications   Medication Sig Start Date End Date Taking? Authorizing Provider  clobetasol  cream (TEMOVATE ) 0.05 % SMARTSIG:Sparingly Topical Twice Daily 05/06/23   [provider]  etonogestrel -ethinyl estradiol  (NUVARING) 0.12-0.015 MG/24HR vaginal ring Insert vaginally and leave in place for 3 consecutive weeks, then remove for 1 week. 04/10/24   Abraham Abo, MD  ferrous sulfate  325 (65 FE) MG tablet Take 1 tablet (325 mg total) by mouth daily with breakfast. 09/16/23   Abraham Abo, MD  fluconazole  (DIFLUCAN ) 150 MG tablet Take 1 tablet (150 mg total) by mouth every 3 (three) days. 03/04/24   Starlene Eaton, FNP  FLUoxetine  (PROZAC ) 20 MG capsule Take 1 capsule (20 mg total) by mouth daily. 09/09/23   Abraham Abo, MD  fluticasone  (FLONASE ) 50 MCG/ACT nasal spray Place 1 spray into both nostrils daily.    [provider]  hydrOXYzine  (VISTARIL ) 25 MG capsule Take 1 capsule (  25 mg total) by mouth every 8 (eight) hours as needed. 08/25/23   Senaida Dama, NP  inFLIXimab  (REMICADE  IV) Inject into the vein. Every 8 weeks    [provider]  oseltamivir (TAMIFLU) 75 MG capsule Take 75 mg by mouth 2 (two) times daily. 05/20/23   [provider]  promethazine -dextromethorphan (PROMETHAZINE -DM) 6.25-15 MG/5ML syrup Take 5 mLs by mouth every 4 (four) hours as needed. 05/20/23    [provider]  SUMAtriptan  (IMITREX ) 25 MG tablet Take 25 mg (1 tablet total) by mouth at the start of the headache. May repeat in 2 hours x 1 if headache persists. Max of 2 tablets/24 hours. 08/25/23   Senaida Dama, NP  triamcinolone  lotion (KENALOG ) 0.1 % Apply 1 Application topically 2 (two) times daily. 06/10/20   [provider]  triamcinolone  ointment (KENALOG ) 0.1 % Apply topically. 02/27/20   [provider]                                                                                                                                    Past Surgical History Past Surgical History:  Procedure Laterality Date   APPENDECTOMY     BOWEL RESECTION     COLONOSCOPY     UPPER GASTROINTESTINAL ENDOSCOPY     Family History Family History  Problem Relation Age of Onset   Crohn's disease Mother    Schizophrenia Father    Bipolar disorder Father    Crohn's disease Maternal Grandmother    Prostate cancer Maternal Uncle    Colon cancer Neg Hx    Stomach cancer Neg Hx    Pancreatic cancer Neg Hx    Esophageal cancer Neg Hx    Inflammatory bowel disease Neg Hx    Liver disease Neg Hx    Rectal cancer Neg Hx    Breast cancer Neg Hx     Social History Social History   Tobacco Use   Smoking status: Never    Passive exposure: Never   Smokeless tobacco: Never  Vaping Use   Vaping status: Never Used  Substance Use Topics   Alcohol use: Yes    Comment: socially.    Drug use: Not Currently    Types: Marijuana    Comment: 08-10-22 last used per pt   Allergies Adalimumab  Review of Systems Review of Systems  Physical Exam Vital Signs  I have reviewed the triage vital signs BP 118/81 (BP Location: Left Arm)   Pulse 81   Temp 98.1 F (36.7 C) (Oral)   Resp 15   Ht 5' 2.5 (1.588 m)   Wt 67.6 kg   SpO2 100%   BMI 26.82 kg/m   Physical Exam Vitals and nursing note reviewed.  Cardiovascular:     Rate and Rhythm: Normal rate.  Pulmonary:      Effort: Pulmonary effort is normal.  Skin:    General: Skin is warm.  Neurological:     General: No focal deficit present.     Mental Status: She is alert.  Psychiatric:     Comments: Tearful, denies SI or HI to me     ED Results and Treatments Labs (all labs ordered are listed, but only abnormal results are displayed) Labs Reviewed  COMPREHENSIVE METABOLIC PANEL WITH GFR - Abnormal; Notable for the following components:      Result Value   Potassium 2.9 (*)    CO2 18 (*)    Total Protein 8.7 (*)    All other components within normal limits  ETHANOL - Abnormal; Notable for the following components:   Alcohol, Ethyl (B) 80 (*)    All other components within normal limits  RAPID URINE DRUG SCREEN, HOSP PERFORMED - Abnormal; Notable for the following components:   Cocaine POSITIVE (*)    Tetrahydrocannabinol POSITIVE (*)    All other components within normal limits  CBC WITH DIFFERENTIAL/PLATELET - Abnormal; Notable for the following components:   WBC 11.1 (*)    Hemoglobin 9.2 (*)    HCT 31.5 (*)    MCV 73.9 (*)    MCH 21.6 (*)    MCHC 29.2 (*)    RDW 17.7 (*)    Platelets 412 (*)    All other components within normal limits  HCG, SERUM, QUALITATIVE - Abnormal; Notable for the following components:   Preg, Serum POSITIVE (*)    All other components within normal limits  SALICYLATE LEVEL - Abnormal; Notable for the following components:   Salicylate Lvl <7.0 (*)    All other components within normal limits                                                                                                                          Radiology No results found.  Pertinent labs & imaging results that were available during my care of the patient were reviewed by me and considered in my medical decision making (see MDM for details).  Medications Ordered in ED Medications  acetaminophen  (TYLENOL ) tablet 1,000 mg (1,000 mg Oral Patient Refused/Not Given 04/19/24 2235)  potassium  chloride SA (KLOR-CON M) CR tablet 60 mEq (has no administration in time range)  midazolam (VERSED) injection 5 mg (has no administration in time range)  FLUoxetine  (PROZAC ) capsule 20 mg (has no administration in time range)  Procedures Procedures  (including critical care time)  Medical Decision Making / ED Course   This patient presents to the ED for concern of suicidal ideation, depression, alcohol intoxication, this involves an extensive number of treatment options, and is a complaint that carries with it a high risk of complications and morbidity.  The differential diagnosis includes suicidal ideation, self-harm behavior.  MDM: Patient denies SI and HI to me, however she did make some very concerning statements to counselors, family members, and has engaged in some risky behavior recently.  Patient already has an IVC filled out.  I think it is reasonable to have the patient be seen and evaluated by psychiatry.  She is [redacted] weeks pregnant.  She is not having any abdominal pain.  Basic labs ordered.  TTS consult ordered.  Reassessment 10:40PM-patient medically cleared.  Her potassium is a bit low, will begin to replete.  Awaiting TTS evaluation.  Reassessment 10:55 PM-unfortunately, the patient has now barricaded herself in her room, is making verbal threats toward our staff.  I did not want to order chemical restraint for this patient, however given her current behavior, I believe that she represents a greater danger to herself and to her fetus.  Benzodiazepines have been shown to be relatively safe in first trimester pregnancy.  Have ordered IM Versed.     Additional history obtained: -Additional history obtained from [facility] police -External records from outside source obtained and reviewed including: Chart review including previous notes, labs, imaging,  consultation notes   Lab Tests: -I ordered, reviewed, and interpreted labs.   The pertinent results include:   Labs Reviewed  COMPREHENSIVE METABOLIC PANEL WITH GFR - Abnormal; Notable for the following components:      Result Value   Potassium 2.9 (*)    CO2 18 (*)    Total Protein 8.7 (*)    All other components within normal limits  ETHANOL - Abnormal; Notable for the following components:   Alcohol, Ethyl (B) 80 (*)    All other components within normal limits  RAPID URINE DRUG SCREEN, HOSP PERFORMED - Abnormal; Notable for the following components:   Cocaine POSITIVE (*)    Tetrahydrocannabinol POSITIVE (*)    All other components within normal limits  CBC WITH DIFFERENTIAL/PLATELET - Abnormal; Notable for the following components:   WBC 11.1 (*)    Hemoglobin 9.2 (*)    HCT 31.5 (*)    MCV 73.9 (*)    MCH 21.6 (*)    MCHC 29.2 (*)    RDW 17.7 (*)    Platelets 412 (*)    All other components within normal limits  HCG, SERUM, QUALITATIVE - Abnormal; Notable for the following components:   Preg, Serum POSITIVE (*)    All other components within normal limits  SALICYLATE LEVEL - Abnormal; Notable for the following components:   Salicylate Lvl <7.0 (*)    All other components within normal limits      EKG my independent review of the patient's EKG shows no ST segment depressions or elevations, no T wave inversions, no evidence of acute ischemia.  EKG Interpretation Date/Time:  Wednesday April 19 2024 21:29:03 EDT Ventricular Rate:  77 PR Interval:  203 QRS Duration:  96 QT Interval:  390 QTC Calculation: 442 R Axis:   65  Text Interpretation: Sinus rhythm Borderline prolonged PR interval Confirmed by Afton Horse [phone] on 04/19/2024 10:52:10 PM          Medicines ordered and prescription drug management: Meds  ordered this encounter  Medications   acetaminophen  (TYLENOL ) tablet 1,000 mg   potassium chloride SA (KLOR-CON M) CR tablet 60 mEq   midazolam  (VERSED) injection 5 mg   FLUoxetine  (PROZAC ) capsule 20 mg    -I have reviewed the patients home medicines and have made adjustments as needed   Cardiac Monitoring: The patient was maintained on a cardiac monitor.  I personally viewed and interpreted the cardiac monitored which showed an underlying rhythm of: Normal sinus rhythm  Social Determinants of Health:  Factors impacting patients care include: Lack of access to primary care   Reevaluation: After the interventions noted above, I reevaluated the patient and found that they have :improved  Co morbidities that complicate the patient evaluation  Past Medical History:  Diagnosis Date   Anemia    Anxiety    Blood transfusion without reported diagnosis    Central centrifugal scarring alopecia    Chronic swimmer's ear of right side    Crohn's colitis (HCC)    IDA (iron deficiency anemia)    Impetigo    Postinflammatory hyperpigmentation    Pre-eclampsia in postpartum period 01/14/2019   Scalp psoriasis    Seborrheic dermatitis of scalp         Final Clinical Impression(s) / ED Diagnoses Final diagnoses:  Agitation     @PCDICTATION @    Afton Horse T, DO 04/19/24 2258

## 2024-04-20 ENCOUNTER — Inpatient Hospital Stay (HOSPITAL_COMMUNITY)
Admission: AD | Admit: 2024-04-20 | Discharge: 2024-04-23 | DRG: 832 | Disposition: A | Discharge status: Home / Self Care | Source: Intra-hospital

## 2024-04-20 ENCOUNTER — Encounter (HOSPITAL_COMMUNITY): Payer: Self-pay

## 2024-04-20 DIAGNOSIS — Z5986 Financial insecurity: Secondary | ICD-10-CM | POA: Diagnosis not present

## 2024-04-20 DIAGNOSIS — F17210 Nicotine dependence, cigarettes, uncomplicated: Secondary | ICD-10-CM | POA: Diagnosis not present

## 2024-04-20 DIAGNOSIS — O99341 Other mental disorders complicating pregnancy, first trimester: Principal | ICD-10-CM | POA: Diagnosis present

## 2024-04-20 DIAGNOSIS — Z888 Allergy status to other drugs, medicaments and biological substances status: Secondary | ICD-10-CM

## 2024-04-20 DIAGNOSIS — I10 Essential (primary) hypertension: Secondary | ICD-10-CM | POA: Diagnosis present

## 2024-04-20 DIAGNOSIS — Z818 Family history of other mental and behavioral disorders: Secondary | ICD-10-CM | POA: Diagnosis not present

## 2024-04-20 DIAGNOSIS — F3130 Bipolar disorder, current episode depressed, mild or moderate severity, unspecified: Secondary | ICD-10-CM | POA: Diagnosis not present

## 2024-04-20 DIAGNOSIS — R45851 Suicidal ideations: Secondary | ICD-10-CM | POA: Diagnosis present

## 2024-04-20 DIAGNOSIS — F319 Bipolar disorder, unspecified: Secondary | ICD-10-CM

## 2024-04-20 DIAGNOSIS — F4325 Adjustment disorder with mixed disturbance of emotions and conduct: Secondary | ICD-10-CM | POA: Diagnosis not present

## 2024-04-20 DIAGNOSIS — G43909 Migraine, unspecified, not intractable, without status migrainosus: Secondary | ICD-10-CM

## 2024-04-20 DIAGNOSIS — F1910 Other psychoactive substance abuse, uncomplicated: Secondary | ICD-10-CM | POA: Insufficient documentation

## 2024-04-20 DIAGNOSIS — F4324 Adjustment disorder with disturbance of conduct: Secondary | ICD-10-CM | POA: Diagnosis not present

## 2024-04-20 DIAGNOSIS — Z3A01 Less than 8 weeks gestation of pregnancy: Secondary | ICD-10-CM

## 2024-04-20 DIAGNOSIS — F3160 Bipolar disorder, current episode mixed, unspecified: Secondary | ICD-10-CM | POA: Diagnosis not present

## 2024-04-20 LAB — HCG, QUANTITATIVE, PREGNANCY: hCG, Beta Chain, Quant, S: 6105 m[IU]/mL — ABNORMAL HIGH (ref <5)

## 2024-04-20 LAB — POTASSIUM: Potassium: 3.4 mmol/L — ABNORMAL LOW (ref 3.5–5.1)

## 2024-04-20 LAB — HCG, SERUM, QUALITATIVE: Preg, Serum: POSITIVE — AB

## 2024-04-20 MED ORDER — HYDROXYZINE HCL 25 MG PO TABS
25.0000 mg | ORAL_TABLET | Freq: Four times a day (QID) | ORAL | Status: DC | PRN
  Administered 2024-04-20: 25 mg via ORAL
  Filled 2024-04-20: qty 1

## 2024-04-20 MED ORDER — TRAZODONE HCL 50 MG PO TABS: 50.0000 mg | ORAL_TABLET | Freq: Every evening | ORAL | Status: DC | PRN

## 2024-04-20 MED ORDER — HYDROXYZINE HCL 25 MG PO TABS
25.0000 mg | ORAL_TABLET | Freq: Once | ORAL | Status: AC
  Administered 2024-04-20: 25 mg via ORAL
  Filled 2024-04-20: qty 1

## 2024-04-20 MED ORDER — WHITE PETROLATUM EX OINT
TOPICAL_OINTMENT | CUTANEOUS | Status: AC
  Filled 2024-04-20: qty 5

## 2024-04-20 NOTE — Progress Notes (Signed)
 Pt has been accepted to [facility] on 04/20/2024 Bed assignment: 405-2  Pt meets inpatient criteria per: Chandra Come, PMHNP   Attending Physician will be Dr. Zouev  Report can be called to: unit: Adult unit: [phone]  Pt can arrive after IVC discharges   Care Team Notified: [facility] Linsey Stader RN,  Patt Boozer [facility], [place] Mangrum NP  [place] Blossie Raffel LCSW-A   04/20/2024 12:56 PM

## 2024-04-20 NOTE — ED Notes (Signed)
 Attempted to call for report again. Per Secretary RN still busy getting report.

## 2024-04-20 NOTE — Consult Note (Signed)
 [facility] Psychiatric Consult Initial  Patient Name: .Erika Stephens  MRN: [id number]  DOB: [birth year]  Consult Order details:  Orders (From admission, onward)     Start     Ordered   04/19/24 2129  CONSULT TO CALL ACT TEAM       Ordering Provider: Nathanael Baker, DO  Provider:  (Not yet assigned)  Question:  Reason for Consult?  Answer:  Psych consult   04/19/24 2128             Mode of Visit: In person    Psychiatry Consult Evaluation  Service Date: April 20, 2024 LOS:  LOS: 0 days  Chief Complaint suicidal thoughts  Primary Psychiatric Diagnoses  Bipolar disorder 2.   Anxiety 3.   Polysubstance abuse  Assessment  Erika Stephens is a 33 y.o. female admitted: Presented to the ED on 04/19/2024  8:55 PM for suicidal thoughts and aggressive behaviors towards children. She carries the psychiatric diagnoses of bipolar disorder and anxiety and has a past medical history of anemia, Crohn's disease.   Her current presentation of suicidal thoughts, polysubstance abuse, depression is most consistent with decompensating mental illness. She meets criteria for inpatient psychiatric admission based on current presentation.  Current outpatient psychotropic medications include none. On initial examination, patient is pleasant and cooperative. Please see plan below for detailed recommendations.   Diagnoses:  Active Hospital problems: Principal Problem:   Bipolar disorder [facility]) Active Problems:   Polysubstance abuse (HCC)    Plan   ## Psychiatric Medication Recommendations:  Recommend continuing patient home medications    ## Medical Decision Making Capacity: Not specifically addressed in this encounter  ## Further Work-up:  -- HcG ordered; K+ lab ordered  EKG, U/A, or UDS -- most recent EKG on 04/19/2024 had QtC of 442 -- Pertinent labwork reviewed earlier this admission includes: CBC, CMP, UDS, EKG   ## Disposition:-- We recommend inpatient psychiatric hospitalization.  Patient has been involuntarily committed on 04/19/2024.   ## Behavioral / Environmental: -To minimize splitting of staff, assign one staff person to communicate all information from the team when feasible. or Utilize compassion and acknowledge the patient's experiences while setting clear and realistic expectations for care.    ## Safety and Observation Level:  - Based on my clinical evaluation, I estimate the patient to be at low risk of self harm in the current setting. - At this time, we recommend  routine. This decision is based on my review of the chart including patient's history and current presentation, interview of the patient, mental status examination, and consideration of suicide risk including evaluating suicidal ideation, plan, intent, suicidal or self-harm behaviors, risk factors, and protective factors. This judgment is based on our ability to directly address suicide risk, implement suicide prevention strategies, and develop a safety plan while the patient is in the clinical setting. Please contact our team if there is a concern that risk level has changed.  CSSR Risk Category:C-SSRS RISK CATEGORY: No Risk  Suicide Risk Assessment: Patient has following modifiable risk factors for suicide: active suicidal ideation, untreated depression, and recklessness, which we are addressing by recommending inpatient psychiatric admission. Patient has following non-modifiable or demographic risk factors for suicide: none Patient has the following protective factors against suicide: Supportive family and Minor children in the home  Thank you for this consult request. Recommendations have been communicated to the primary team.  We will recommend inpatient psychiatric admission at this time.   Erika Stephens, PMHNP  History of Present Illness  Relevant Aspects of Hospital ED Course:  Admitted on 04/19/2024 for aggressive behavior and suicidal thoughts.    Patient Report:  Erika  Stephens, 80 y.o., female patient seen face to face by this provider, consulted with Dr. Deborah Falling; and chart reviewed on 04/20/24.  On evaluation Erika Stephens reports that yesterday she had a nervous breakdown, states that she is trying to find a job and not having any luck, states that her middle son broke his gaming system, and she also is [redacted] weeks pregnant, family has not been made aware. 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  She states that she feels when she moves steps forward, something is always full of her backward, feeling that she cannot get ahead in life.  She states that she currently lives in her own apartment with her 3 children who are the ages of 40, 42, and 75.  She states that yesterday a lot of things became very stressful, so she took her children to her mother's house, so that she could go back home to calm down and relax.   She says that before she went back home yesterday, she stopped by the [facility] store and got her a bottle of tequila, states she was at home and began drinking her tequila, when she heard a knock on the door, states the police and counselors were in her door.  She states that she was pleasant towards the room, and in a pleasant mood, states she was cooperative with the counselor and the police.  She states that she did not tell them that she did not want to end her life, she states that she said she cannot wait for God to crack the sky open and come take us  all.  She states that she made that statement because she is not tired of the things that she has to go through on her, stating that people are evil and not fair.  States that she had a lot going on in her life that is stressful, raising 3 children with the fourth child on the way and not receiving any type of help from any of the children's fathers, but says that her mother and grandmother help her, but cannot help her financially.  She  denies having any past inpatient psychiatric admission history, does not receive any outpatient psychiatric services.  She endorses smoking marijuana and drinking liquor, this provider asked her about her cocaine usage, she denied it saying I have not done that in a long time.  This provider informed her that her UDS was positive for cocaine and marijuana, BAL is 80.  Patient states that she does have a history of being on Prozac  for depression, and Atarax  for anxiety, but has not been compliant with medications for which she got from her primary care physician.  During evaluation patient is sitting up in her bed, and appears to be in moderate distress, as she states she cannot be here, she needs to go a look after her children.  She is alert and oriented x 4, mainly calm throughout assessment but at times becomes frustrated, she is cooperative and attentive.  Her mood is labile with congruent affect, as she states that she just had one bad day, and this has been exaggerated and she does not need to be here.  She is hyperfocused on going home. Objectively there is  no evidence of psychosis/mania or delusional thinking.  Patient is able to converse coherently, goal directed thoughts, no distractibility, or pre-occupation. She currently denies suicidal/self-harm/homicidal ideation, psychosis, and paranoia.      Collateral information:  Probation officer and I spoke with patient's, child's therapist Jaylene Gilbert, pts daughter's therapist from [facility] who was the person who IVC'd pt. Yesterday Ms. Oletta Berry received a face time call from pts daughter Marinus Sic who was crying and very upset because of pts behavior. Pt had become upset after her son Jordan broke his gaming console. Pt picked up the console and threw it toward her son which hit him on the ankle. Pt began to get very upset and yell. Ms. Oletta Berry reports hearing pts son in the background saying that he was afraid  because of pts behavior.    Pt brought her children to her mother's home and then left to purchase a bottle of Jose alcohol and returned to her home. Ms. Oletta Berry called the police to check on pt while she checked on pts children at pts mother home. Police reported that pt was saying that she wanted to drink alcohol, use cocaine and that she wanted to kill herself. Per Ms. Oletta Berry pt had previously reported to her that she had a history of having hallucinations (hearing tigers roar) and that her father was diagnosed with schizophrenia.    Police then came to pts mother's home to check on pts children and obtain information from Ms. Oletta Berry. Pts children reported to police that pt had behaved similarly in the past. Ms. Oletta Berry reports that pt removed her daughter from their services that day. Ms. Oletta Berry has filed a CPS report. The CPS social worker is Clark Crosser [phone]).   Psych ROS:  Depression: Endorses Anxiety:  Endorses Mania (lifetime and current): Denies  Psychosis: (lifetime and current): Yes   Review of Systems  Psychiatric/Behavioral:  Positive for depression, substance abuse and suicidal ideas.      Psychiatric and Social History  Psychiatric History:  Information collected from patient, chart review, Mrs. Oletta Berry  Prev Dx/Sx: Bipolar disorder, depression, anxiety Current Psych Provider: Denies Home Meds (current): Yes Previous Med Trials: Unknown Therapy: Denies  Prior Psych Hospitalization: Denies Prior Self Harm: Denies Prior Violence: Denies  Family Psych History: Patient father has schizophrenia Family Hx suicide: Denies  Social History:  Developmental Hx: Deferred Educational Hx: Patient graduated from high school Occupational Hx: Unemployed Legal Hx: Denies Living Situation: Lives in her own home with her children Spiritual Hx: Yes Access to weapons/lethal means: Denies   Substance History Alcohol: Yes Type of alcohol liquor Last Drink  yesterday Number of drinks per day occasionally History of alcohol withdrawal seizures Denies History of DT's Denies Tobacco: Denies Illicit drugs: Yes Prescription drug abuse: Denies Rehab hx: Denies  Exam Findings  Physical Exam:  Vital Signs:  Temp:  [98.1 F (36.7 C)-98.2 F (36.8 C)] 98.2 F (36.8 C) (06/12 0549) Pulse Rate:  [70-81] 70 (06/12 0549) Resp:  [15-16] 16 (06/12 0549) BP: (100-118)/(68-81) 100/68 (06/12 0549) SpO2:  [97 %-100 %] 97 % (06/12 0549) Weight:  [67.6 kg] 67.6 kg (06/11 2122) Blood pressure 100/68, pulse 70, temperature 98.2 F (36.8 C), resp. rate 16, height 5' 2.5 (1.588 m), weight 67.6 kg, SpO2 97%. Body mass index is 26.82 kg/m.  Physical Exam Vitals and nursing note reviewed. Exam conducted with a chaperone present.   Neurological:     Mental Status: She is alert.   Psychiatric:  Attention and Perception: Attention normal.        Mood and Affect: Mood is depressed. Affect is flat.        Speech: Speech normal.        Behavior: Behavior is agitated. Behavior is cooperative.        Cognition and Memory: Memory normal.        Judgment: Judgment is impulsive.     Mental Status Exam: General Appearance: Casual  Orientation:  Full (Time, Place, and Person)  Memory:  Immediate;   Fair Remote;   Fair  Concentration:  Concentration: Fair and Attention Span: Fair  Recall:  Fair  Attention  Poor  Eye Contact:  Fair  Speech:  Clear and Coherent  Language:  Fair  Volume:  Normal  Mood: irritable   Affect:  Appropriate  Thought Process:  Coherent  Thought Content:  WDL  Suicidal Thoughts:  No  Homicidal Thoughts:  No  Judgement:  Poor  Insight:  Lacking  Psychomotor Activity:  Normal  Akathisia:  NA  Fund of Knowledge:  NA      Assets:  Manufacturing systems engineer Desire for Improvement Social Support  Cognition:  WNL  ADL's:  Intact  AIMS (if indicated):        Other History   These have been pulled in through the EMR,  reviewed, and updated if appropriate.  Family History:  The patient's family history includes Bipolar disorder in her father; Crohn's disease in her maternal grandmother and mother; Prostate cancer in her maternal uncle; Schizophrenia in her father.  Medical History: Past Medical History:  Diagnosis Date   Anemia    Anxiety    Blood transfusion without reported diagnosis    Central centrifugal scarring alopecia    Chronic swimmer's ear of right side    Crohn's colitis (HCC)    IDA (iron deficiency anemia)    Impetigo    Postinflammatory hyperpigmentation    Pre-eclampsia in postpartum period 01/14/2019   Scalp psoriasis    Seborrheic dermatitis of scalp     Surgical History: Past Surgical History:  Procedure Laterality Date   APPENDECTOMY     BOWEL RESECTION     COLONOSCOPY     UPPER GASTROINTESTINAL ENDOSCOPY       Medications:   Current Facility-Administered Medications:    acetaminophen  (TYLENOL ) tablet 1,000 mg, 1,000 mg, Oral, Once, Afton Horse T, DO   FLUoxetine  (PROZAC ) capsule 20 mg, 20 mg, Oral, Daily, Afton Horse T, DO, 20 mg at 04/20/24 1038   hydrOXYzine  (ATARAX ) tablet 25 mg, 25 mg, Oral, Q6H PRN, Alissa April, MD, 25 mg at 04/20/24 1610  Current Outpatient Medications:    Cobalamin Combinations (B-12) [phone] MCG SUBL, Place 1 Dose under the tongue daily at 12 noon., Disp: , Rfl:    inFLIXimab  (REMICADE  IV), Inject into the vein. Every 8 weeks, Disp: , Rfl:    etonogestrel -ethinyl estradiol  (NUVARING) 0.12-0.015 MG/24HR vaginal ring, Insert vaginally and leave in place for 3 consecutive weeks, then remove for 1 week. (Patient not taking: Reported on 04/20/2024), Disp: 3 each, Rfl: 1   ferrous sulfate  325 (65 FE) MG tablet, Take 1 tablet (325 mg total) by mouth daily with breakfast. (Patient not taking: Reported on 04/20/2024), Disp: 90 tablet, Rfl: 1   FLUoxetine  (PROZAC ) 20 MG capsule, Take 1 capsule (20 mg total) by mouth daily. (Patient not taking:  Reported on 04/20/2024), Disp: 30 capsule, Rfl: 1   hydrOXYzine  (VISTARIL ) 25 MG capsule, Take 1 capsule (25 mg total)  by mouth every 8 (eight) hours as needed. (Patient not taking: Reported on 04/20/2024), Disp: 30 capsule, Rfl: 1   SUMAtriptan  (IMITREX ) 25 MG tablet, Take 25 mg (1 tablet total) by mouth at the start of the headache. May repeat in 2 hours x 1 if headache persists. Max of 2 tablets/24 hours. (Patient not taking: Reported on 04/20/2024), Disp: 10 tablet, Rfl: 0  Allergies: Allergies  Allergen Reactions   Adalimumab Hives    Ariyel Jeangilles Stephens, PMHNP

## 2024-04-20 NOTE — [facility] Group Notes (Signed)
 [facility] Group Notes:  (Nursing/MHT/Case Management/Adjunct)  Date:  04/20/2024  Time:  9:38 PM  Type of Therapy:  Wrap-up group  Participation Level:  Did Not Attend  Participation Quality:    Affect:    Cognitive:    Insight:    Engagement in Group:    Modes of Intervention:    Summary of Progress/Problems: Didn't attend.  Erika Stephens 04/20/2024, 9:38 PM

## 2024-04-20 NOTE — ED Notes (Signed)
 Snack given.

## 2024-04-20 NOTE — Progress Notes (Signed)
 Patient ID: Erika Stephens, female   DOB: 1990/12/05, 33 y.o.   MRN: [id number]  Admission Note:  33 yr female who presents IVC in no acute distress for the treatment of SI and Depression. Pt appears flat and depressed. Pt was calm and cooperative with admission process. Pt is [redacted] weeks pregnant and stated the baby father blocked her from communication. Pt stated she did not want to keep the baby. Pt said she has a pending DUI and things have been going bad for her since coming here from [place]  .   Per Assessment: female admitted: Presented to the ED on 04/19/2024  8:55 PM for suicidal thoughts and aggressive behaviors towards children. She carries the psychiatric diagnoses of bipolar disorder and anxiety and has a past medical history of anemia, Crohn's disease.    Her current presentation of suicidal thoughts, polysubstance abuse, depression is most consistent with decompensating mental illness. She meets criteria for inpatient psychiatric admission based on current presentation.  Current outpatient psychotropic medications include none. On initial examination, patient is pleasant and cooperative. Please see plan below for detailed recommendations.   [facility] called Jaylene Gilbert, pts daughter's therapist from [facility] who was the person who IVC'd pt. Yesterday Ms. Oletta Berry received a face time call from pts daughter Marinus Sic who was crying and very upset because of pts behavior. Pt had become upset after her son Jordan broke his gaming console. Pt picked up the console and threw it toward her son which hit him on the ankle. Pt began to get very upset and yell. Ms. Oletta Berry reports hearing pts son in the background saying that he was afraid because of pts behavior.    Pt brought her children to her mother's home and then left to purchase a bottle of Jose alcohol and returned to her home. Ms. Oletta Berry called the police to check on pt while she checked on pts children at pts mother home. Police  reported that pt was saying that she wanted to drink alcohol, use cocaine and that she wanted to kill herself. Per Ms. Oletta Berry pt had previously reported to her that she had a history of having hallucinations (hearing tigers roar) and that her father was diagnosed with schizophrenia.    Police then came to pts mother's home to check on pts children and obtain information from Ms. Oletta Berry. Pts children reported to police that pt had behaved similarly in the past. Ms. Oletta Berry reports that pt removed her daughter from their services that day. Ms. Oletta Berry has filed a CPS report. The CPS social worker is Clark Crosser [phone]).  A: Skin was assessed and found to be clear of any abnormal marks . PT searched and no contraband found, POC and unit policies explained and understanding verbalized. Consents obtained. Food and fluids offered, and  accepted.   R:Pt had no additional questions or concerns.

## 2024-04-20 NOTE — ED Notes (Signed)
 Attempted to call for report at [facility]. Will try again later.

## 2024-04-20 NOTE — ED Notes (Signed)
 [facility] called Jaylene Gilbert, pts daughter's therapist from [facility] who was the person who IVC'd pt. Yesterday Ms. Oletta Berry received a face time call from pts daughter Marinus Sic who was crying and very upset because of pts behavior. Pt had become upset after her son Jordan broke his gaming console. Pt picked up the console and threw it toward her son which hit him on the ankle. Pt began to get very upset and yell. Ms. Oletta Berry reports hearing pts son in the background saying that he was afraid because of pts behavior.   Pt brought her children to her mother's home and then left to purchase a bottle of Jose alcohol and returned to her home. Ms. Oletta Berry called the police to check on pt while she checked on pts children at pts mother home. Police reported that pt was saying that she wanted to drink alcohol, use cocaine and that she wanted to kill herself. Per Ms. Oletta Berry pt had previously reported to her that she had a history of having hallucinations (hearing tigers roar) and that her father was diagnosed with schizophrenia.   Police then came to pts mother's home to check on pts children and obtain information from Ms. Oletta Berry. Pts children reported to police that pt had behaved similarly in the past. Ms. Oletta Berry reports that pt removed her daughter from their services that day. Ms. Oletta Berry has filed a CPS report. The CPS social worker is Clark Crosser [phone]).  Patt Boozer, [facility]  04/20/24

## 2024-04-20 NOTE — ED Provider Notes (Signed)
 Emergency Medicine Observation Re-evaluation Note  Erika Stephens is a 33 y.o. female, seen on rounds today.  Pt initially presented to the ED for complaints of Suicidal Currently, the patient is resting  Physical Exam  BP 100/68 (BP Location: Left Arm)   Pulse 70   Temp 98.2 F (36.8 C)   Resp 16   Ht 5' 2.5 (1.588 m)   Wt 67.6 kg   SpO2 97%   BMI 26.82 kg/m  Physical Exam General: Nad Cardiac: rr Lungs: non labored Psych: calm   ED Course / MDM  EKG:EKG Interpretation Date/Time:  Wednesday April 19 2024 21:29:03 EDT Ventricular Rate:  77 PR Interval:  203 QRS Duration:  96 QT Interval:  390 QTC Calculation: 442 R Axis:   65  Text Interpretation: Sinus rhythm Borderline prolonged PR interval Confirmed by Afton Horse [phone]) on 04/19/2024 10:52:10 PM  I have reviewed the labs performed to date as well as medications administered while in observation.  Recent changes in the last 24 hours include none.  Plan  Current plan is for TTS/Psych evaluation.    Rolinda Climes, DO 04/20/24 [phone]

## 2024-04-20 NOTE — Tx Team (Signed)
 Initial Treatment Plan 04/20/2024 11:09 PM Erika Stephens [id number]    PATIENT STRESSORS: Legal issue   Marital or family conflict     PATIENT STRENGTHS: General fund of knowledge  Motivation for treatment/growth    PATIENT IDENTIFIED PROBLEMS: Risk SI  depression  [redacted] weeks pregnant  Anger issues               DISCHARGE CRITERIA:  Improved stabilization in mood, thinking, and/or behavior Verbal commitment to aftercare and medication compliance  PRELIMINARY DISCHARGE PLAN: Attend aftercare/continuing care group Outpatient therapy  PATIENT/FAMILY INVOLVEMENT: This treatment plan has been presented to and reviewed with the patient, Erika Stephens.  The patient and family have been given the opportunity to ask questions and make suggestions.  Silvio Dry, RN 04/20/2024, 11:09 PM

## 2024-04-20 NOTE — BH Assessment (Signed)
 TTS attempted to complete TTS assessment. Per, Kurt Phi, RN, patient given hydroxyzine  2 hours ago. TTS will be seen during next shift.

## 2024-04-20 NOTE — ED Notes (Signed)
 Patient continues to yell things out while in room vs coming to the desk and requesting. Reports that she is ready to go home and is tearful.

## 2024-04-21 ENCOUNTER — Encounter (HOSPITAL_COMMUNITY): Payer: Self-pay | Admitting: Psychiatry

## 2024-04-21 ENCOUNTER — Encounter (HOSPITAL_COMMUNITY): Payer: Self-pay

## 2024-04-21 DIAGNOSIS — F3160 Bipolar disorder, current episode mixed, unspecified: Secondary | ICD-10-CM

## 2024-04-21 MED ORDER — NALTREXONE HCL 50 MG PO TABS
25.0000 mg | ORAL_TABLET | Freq: Every day | ORAL | Status: DC
  Administered 2024-04-21: 25 mg via ORAL
  Filled 2024-04-21 (×2): qty 1

## 2024-04-21 MED ORDER — DOXYLAMINE SUCCINATE (SLEEP) 25 MG PO TABS
25.0000 mg | ORAL_TABLET | Freq: Every evening | ORAL | Status: DC | PRN
  Filled 2024-04-21: qty 1

## 2024-04-21 MED ORDER — QUETIAPINE FUMARATE 50 MG PO TABS
50.0000 mg | ORAL_TABLET | Freq: Every day | ORAL | Status: DC
  Administered 2024-04-21: 50 mg via ORAL
  Filled 2024-04-21: qty 1

## 2024-04-21 NOTE — Group Note (Signed)
 Date:  04/21/2024 Time:  10:47 AM  Group Topic/Focus:  Goals Group:   The focus of this group is to help patients establish daily goals to achieve during treatment and discuss how the patient can incorporate goal setting into their daily lives to aide in recovery.    Participation Level:  Did Not Attend  Participation Quality:    Affect:    Cognitive:    Insight:   Engagement in Group:    Modes of Intervention:    Additional Comments:  Pt were aware of group time. Pt refused to attend group.  Tita Form 04/21/2024, 10:47 AM

## 2024-04-21 NOTE — Plan of Care (Signed)
?  Problem: Education: ?Goal: Emotional status will improve ?Outcome: Progressing ?  ?Problem: Activity: ?Goal: Sleeping patterns will improve ?Outcome: Progressing ?  ?Problem: Education: ?Goal: Mental status will improve ?Outcome: Not Progressing ?  ?
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
## 2024-04-21 NOTE — H&P (Signed)
 Psychiatric Admission Assessment Adult  Patient Identification: Erika Stephens  MRN:  [id number]  Date of Evaluation:  04/21/24  Chief Complaint:  Bipolar disorder current episode depressed (HCC) [F31.30]   Principal Diagnosis: Bipolar disorder current episode depressed (HCC)  Diagnosis:  Principal Problem:   Bipolar disorder current episode depressed [facility])    Chief Complaint: The other day I was trying to find a job and my son broke his gaming monitor and I started breaking stuff...   History of Present Illness: Erika Stephens is a 33 y.o. who  has a past medical history of Anemia, Anxiety, Blood transfusion without reported diagnosis, Central centrifugal scarring alopecia, Chronic swimmer's ear of right side, Crohn's colitis (HCC), IDA (iron deficiency anemia), Impetigo, Postinflammatory hyperpigmentation, Pre-eclampsia in postpartum period (01/14/2019), Scalp psoriasis, and Seborrheic dermatitis of scalp.  She presented to [facility] for Bipolar disorder current episode depressed (HCC).  She presented under petition for IVC after making suicidal statements and throwing electronics at one of her children in the presence of a behavioral health counselor who was at the home providing therapy to one of the children.  She has also been abusing alcohol and cocaine.  She appears to be a fair historian.  The patient reports that she snapped under the weight of numerous psychosocial stressors.  She tells me that on the day of the incident she took her kids to her mother's.  She states that her one of her children's behavioral health counselor was in the house and that the counselor took out paperwork on her after she started breaking things and throwing a tantrum.  She minimizes her behavior stating it is not like I was bothering other people I was doing at my house.  She tells me that she has a lot of trouble with emotional instability.    She states that she gets really frustrated and angry quite easily.  She  tells me that at least once a month she is having tantrums like this and frequently breaking things.  She has financial stressors, her significant other blocked her when she told them that she was pregnant.  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  She also has legal problems related to a pending DUI which is affecting her ability to gain employment.  She is also having trouble with her vehicle and does not have reliable transportation.  She reports that she already has 3 children and wishes to terminate the pregnancy.  She tells me that she has dwindled down to rock bottom.  The patient reports symptoms of major depressive disorder including depressed mood, anhedonia, decreased appetite with weight loss, insomnia, increased fatigue, feelings of hopelessness, helplessness, and worthlessness, feelings of guilt, decreased concentration, recurrent thoughts of death, and suicidal ideation.   The patient reports symptoms of borderline personality disorder including unstable relationships, identity disturbance, fear of abandonment, impulsivity, self-harm and suicidal behaviors, emotional instability, persistent feelings of emptiness, inappropriate anger, and transient stress-related dissociation.  The patient meets criteria for a bipolar 1 diagnosis that she was brought to the hospital during what qualifies as a manic episode.  That said her symptomatology is most consistent with borderline personality disorder.  Given the severity of her mood dysregulation she would benefit from a mood stabilizer.  We discussed starting Seroquel, as this is one of the safer antipsychotics to take during pregnancy.  We discussed starting naltrexone for alcohol use disorder.  Although  she plans to terminate the pregnancy, I want to prescribe conservatively in case she changes her mind.  We discussed the risks and benefits of Seroquel as well  as naltrexone during pregnancy, and the patient feels like the benefits outweigh the risks.   Past Psychiatric History: She  has a past medical history of Anemia, Anxiety, Blood transfusion without reported diagnosis, Central centrifugal scarring alopecia, Chronic swimmer's ear of right side, Crohn's colitis (HCC), IDA (iron deficiency anemia), Impetigo, Postinflammatory hyperpigmentation, Pre-eclampsia in postpartum period (01/14/2019), Scalp psoriasis, and Seborrheic dermatitis of scalp.  Patient denies previous psychiatric admissions.  She states that she has been seen by psychiatrist that she was a little kid.  She reports that most recently a Dr. Annabell Key prescribed fluoxetine  for her which she states that she took for about a month felt better and discontinued.  She tells me that she took Wellbutrin in the past which caused a psychotic episode.  Unclear if this was a trigger for mania or not.  She also reports a distant history of taking Strattera with little benefit.  Is the patient at risk to self?  Yes Has the patient been a risk to self in the past 6 months? No Has the patient been a risk to self within the distant past? No Is the patient a risk to others?  Yes Has the patient been a risk to others in the past 6 months? No Has the patient been a risk to others within the distant past? No  [place] Scale:  Flowsheet Row Ad[facility] ([facility]) [facility] from 04/19/2024 in [facility] from 03/04/2024 in [facility] at [facility])  C-SSRS RISK CATEGORY No Risk No Risk No Risk       Prior Inpatient Therapy: The patient states that this is her first inpatient admission. Prior Outpatient Therapy: Patient endorses seeing psychiatry on and off since the age of 33.  Alcohol Screening:  1. How often do you have a drink containing alcohol?: 2 to 4 times a month 2. How  many drinks containing alcohol do you have on a typical day when you are drinking?: 7, 8, or 9 3. How often do you have six or more drinks on one occasion?: Weekly AUDIT-C Score: 8 4. How often during the last year have you found that you were not able to stop drinking once you had started?: Weekly 5. How often during the last year have you failed to do what was normally expected from you because of drinking?: Never 6. How often during the last year have you needed a first drink in the morning to get yourself going after a heavy drinking session?: Never 7. How often during the last year have you had a feeling of guilt of remorse after drinking?: Never 8. How often during the last year have you been unable to remember what happened the night before because you had been drinking?: Less than monthly 9. Have you or someone else been injured as a result of your drinking?: No 10. Has a relative or friend or a doctor or another health worker been concerned about your drinking or suggested you cut down?: No Alcohol Use Disorder Identification Test Final Score (AUDIT): 12  Substance Abuse History in the last 12 months: Cocaine and alcohol Consequences of Substance Abuse: NA  Previous Psychotropic Medications: Yes Psychological Evaluations: No  Past Medical History:  Past Medical History:  Diagnosis  Date   Anemia    Anxiety    Blood transfusion without reported diagnosis    Central centrifugal scarring alopecia    Chronic swimmer's ear of right side    Crohn's colitis (HCC)    IDA (iron deficiency anemia)    Impetigo    Postinflammatory hyperpigmentation    Pre-eclampsia in postpartum period 01/14/2019   Scalp psoriasis    Seborrheic dermatitis of scalp      Family Psychiatric & Medical History:  Family History  Problem Relation Age of Onset   Crohn's disease Mother    Schizophrenia Father    Bipolar disorder Father    Crohn's disease Maternal Grandmother    Prostate cancer Maternal  Uncle    Colon cancer Neg Hx    Stomach cancer Neg Hx    Pancreatic cancer Neg Hx    Esophageal cancer Neg Hx    Inflammatory bowel disease Neg Hx    Liver disease Neg Hx    Rectal cancer Neg Hx    Breast cancer Neg Hx     Tobacco Screening:  Social History   Tobacco Use  Smoking Status Some Days   Current packs/day: 0.25   Types: Cigarettes   Passive exposure: Never  Smokeless Tobacco Never      Social History:  Social History   Substance and Sexual Activity  Alcohol Use Yes   Comment: socially.       Additional Social History: Marital status: Single Are you sexually active?: Yes What is your sexual orientation?: Heterosexual. Has your sexual activity been affected by drugs, alcohol, medication, or emotional stress?: Patient denies. Does patient have children?: Yes How many children?: 3 How is patient's relationship with their children?: It was good and I'm praying that they forgive me.     Allergies:   Allergies  Allergen Reactions   Adalimumab Hives     Lab Results:  Results for orders placed or performed during the hospital encounter of 04/19/24 (from the past 48 hours)  Comprehensive metabolic panel     Status: Abnormal   Collection Time: 04/19/24  9:23 PM  Result Value Ref Range   Sodium 135 135 - 145 mmol/L   Potassium 2.9 (L) 3.5 - 5.1 mmol/L   Chloride 106 98 - 111 mmol/L   CO2 18 (L) 22 - 32 mmol/L   Glucose, Bld 89 70 - 99 mg/dL    Comment: Glucose reference range applies only to samples taken after fasting for at least 8 hours.   BUN 9 6 - 20 mg/dL   Creatinine, Ser 9.14 0.44 - 1.00 mg/dL   Calcium 9.2 8.9 - 78.2 mg/dL   Total Protein 8.7 (H) 6.5 - 8.1 g/dL   Albumin 4.1 3.5 - 5.0 g/dL   AST 22 15 - 41 U/L   ALT 14 0 - 44 U/L   Alkaline Phosphatase 60 38 - 126 U/L   Total Bilirubin 0.6 0.0 - 1.2 mg/dL   GFR, Estimated >95 >62 mL/min    Comment: (NOTE) Calculated using the CKD-EPI Creatinine Equation (2021)    Anion gap 11 5 - 15     Comment: Performed at [facility], [address], [place], [place] [address]  Ethanol     Status: Abnormal   Collection Time: 04/19/24  9:23 PM  Result Value Ref Range   Alcohol, Ethyl (B) 80 (H) <15 mg/dL    Comment: (NOTE) For medical purposes only. Performed at [facility], [address]  [place], [place] [address]   CBC with Diff     Status: Abnormal   Collection Time: 04/19/24  9:23 PM  Result Value Ref Range   WBC 11.1 (H) 4.0 - 10.5 K/uL   RBC 4.26 3.87 - 5.11 MIL/uL   Hemoglobin 9.2 (L) 12.0 - 15.0 g/dL   HCT 60.4 (L) 54.0 - 98.1 %   MCV 73.9 (L) 80.0 - 100.0 fL   MCH 21.6 (L) 26.0 - 34.0 pg   MCHC 29.2 (L) 30.0 - 36.0 g/dL   RDW 19.1 (H) 47.8 - 29.5 %   Platelets 412 (H) 150 - 400 K/uL   nRBC 0.0 0.0 - 0.2 %   Neutrophils Relative % 58 %   Neutro Abs 6.6 1.7 - 7.7 K/uL   Lymphocytes Relative 31 %   Lymphs Abs 3.4 0.7 - 4.0 K/uL   Monocytes Relative 8 %   Monocytes Absolute 0.8 0.1 - 1.0 K/uL   Eosinophils Relative 2 %   Eosinophils Absolute 0.2 0.0 - 0.5 K/uL   Basophils Relative 1 %   Basophils Absolute 0.1 0.0 - 0.1 K/uL   Immature Granulocytes 0 %   Abs Immature Granulocytes 0.04 0.00 - 0.07 K/uL    Comment: Performed at [facility], [address], [place], [place] [address]  hCG, serum, qualitative     Status: Abnormal   Collection Time: 04/19/24  9:23 PM  Result Value Ref Range   Preg, Serum POSITIVE (A) NEGATIVE    Comment:        THE SENSITIVITY OF THIS METHODOLOGY IS >10 mIU/mL. Performed at [facility], [address] [place], [place] [address]   Salicylate level     Status: Abnormal   Collection Time: 04/19/24  9:23 PM  Result Value Ref Range   Salicylate Lvl <7.0 (L) 7.0 - 30.0 mg/dL    Comment: Performed at [facility], [address] [place], [place] [address]  Urine rapid drug screen (hosp performed)     Status: Abnormal   Collection  Time: 04/19/24 10:34 PM  Result Value Ref Range   Opiates NONE DETECTED NONE DETECTED   Cocaine POSITIVE (A) NONE DETECTED   Benzodiazepines NONE DETECTED NONE DETECTED   Amphetamines NONE DETECTED NONE DETECTED   Tetrahydrocannabinol POSITIVE (A) NONE DETECTED   Barbiturates NONE DETECTED NONE DETECTED    Comment: (NOTE) DRUG SCREEN FOR MEDICAL PURPOSES ONLY.  IF CONFIRMATION IS NEEDED FOR ANY PURPOSE, NOTIFY LAB WITHIN 5 DAYS.  LOWEST DETECTABLE LIMITS FOR URINE DRUG SCREEN Drug Class                     Cutoff (ng/mL) Amphetamine and metabolites    1000 Barbiturate and metabolites    200 Benzodiazepine                 200 Opiates and metabolites        300 Cocaine and metabolites        300 THC                            50 Performed at [facility], [address], [place], [place] [address]   hCG, serum, qualitative     Status: Abnormal   Collection Time: 04/20/24  2:03 PM  Result Value Ref Range   Preg, Serum POSITIVE (A) NEGATIVE    Comment:        THE SENSITIVITY  OF THIS METHODOLOGY IS >10 mIU/mL. Performed at [facility], [address] [place], [place] [address]   hCG, quantitative, pregnancy     Status: Abnormal   Collection Time: 04/20/24  2:30 PM  Result Value Ref Range   hCG, Beta Chain, Quant, S 6,105 (H) <5 mIU/mL    Comment:          GEST. AGE      CONC.  (mIU/mL)   <=1 WEEK        5 - 50     2 WEEKS       50 - 500     3 WEEKS       100 - 10,000     4 WEEKS     1,000 - 30,000     5 WEEKS     3,500 - 115,000   6-8 WEEKS     12,000 - 270,000    12 WEEKS     15,000 - 220,000        FEMALE AND NON-PREGNANT FEMALE:     LESS THAN 5 mIU/mL Performed at [facility], [address] [place], [place] [address]   Potassium     Status: Abnormal   Collection Time: 04/20/24  3:25 PM  Result Value Ref Range   Potassium 3.4 (L) 3.5 - 5.1 mmol/L    Comment: Performed at [facility],  [address], [place], [place] [address]     Blood Alcohol level:  Lab Results  Component Value Date   ETH 80 (H) 04/19/2024    Metabolic Disorder Labs:  Lab Results  Component Value Date   HGBA1C 5.2 08/19/2022   No results found for: PROLACTIN  Lab Results  Component Value Date   CHOL 177 09/15/2023   TRIG 90 09/15/2023   HDL 54 09/15/2023   LDLCALC 106 (H) 09/15/2023   LDLCALC 107 (H) 08/19/2022      Current Medications: Current Facility-Administered Medications  Medication Dose Route Frequency Provider Last Rate Last Admin   doxylamine (Sleep) (UNISOM) tablet 25 mg  25 mg Oral QHS PRN Aubery Douthat S, MD       naltrexone (DEPADE) tablet 25 mg  25 mg Oral Daily Teauna Dubach S, MD   25 mg at 04/21/24 1644   QUEtiapine (SEROQUEL) tablet 50 mg  50 mg Oral QHS Mattias Walmsley S, MD        PTA Medications: Medications Prior to Admission  Medication Sig Dispense Refill Last Dose/Taking   Cobalamin Combinations (B-12) [phone] MCG SUBL Place 1 Dose under the tongue daily at 12 noon.      etonogestrel -ethinyl estradiol  (NUVARING) 0.12-0.015 MG/24HR vaginal ring Insert vaginally and leave in place for 3 consecutive weeks, then remove for 1 week. (Patient not taking: Reported on 04/20/2024) 3 each 1    ferrous sulfate  325 (65 FE) MG tablet Take 1 tablet (325 mg total) by mouth daily with breakfast. (Patient not taking: Reported on 04/20/2024) 90 tablet 1    FLUoxetine  (PROZAC ) 20 MG capsule Take 1 capsule (20 mg total) by mouth daily. (Patient not taking: Reported on 04/20/2024) 30 capsule 1    hydrOXYzine  (VISTARIL ) 25 MG capsule Take 1 capsule (25 mg total) by mouth every 8 (eight) hours as needed. (Patient not taking: Reported on 04/20/2024) 30 capsule 1    inFLIXimab  (REMICADE  IV) Inject into the vein. Every 8 weeks      SUMAtriptan  (IMITREX ) 25 MG tablet Take 25 mg (1 tablet  total) by mouth at the start of the headache. May repeat in 2 hours x 1 if headache persists. Max of  2 tablets/24 hours. (Patient not taking: Reported on 04/20/2024) 10 tablet 0      Musculoskeletal: Strength & Muscle Tone: within normal limits Gait & Station: normal Patient leans: N/A    Psychiatric Specialty Exam:  Presentation  General Appearance: Fairly Groomed  Eye Contact: Good  Speech: Clear and Coherent  Speech Volume: Increased  Handedness: Right   Mood and Affect  Mood: Depressed; Anxious; Hopeless; Worthless  Affect: Restricted; Congruent   Thought Process  Thought Processes: Linear  Descriptions of Associations: Intact  Orientation: Full (Time, Place and Person)  Thought Content: Logical  History of Schizophrenia/Schizoaffective disorder: No data recorded Duration of Psychotic Symptoms: NA Hallucinations: Hallucinations: None  Ideas of Reference: None  Suicidal Thoughts: Suicidal Thoughts: No  Homicidal Thoughts: Homicidal Thoughts: No   Sensorium  Memory: Immediate Good; Recent Good  Judgment: Poor  Insight: Fair   Art therapist  Concentration: Fair  Attention Span: Good  Recall: Good  Fund of Knowledge: Good  Language: Good   Psychomotor Activity  Psychomotor Activity: Psychomotor Activity: Restlessness   Assets  Assets: Communication Skills; Desire for Improvement; Housing   Sleep  Sleep: Sleep: Poor    Physical Exam: General: Sitting comfortably. NAD. HEENT: Normocephalic, atraumatic, MMM, EMOI Lungs: no increased work of breathing noted Heart: no cyanosis Abdomen: Non distended Musculoskeletal: FROM. No obvious deformities Skin: Warm, dry, intact. No rashes noted Neuro: No obvious focal deficits.  Gait and station are normal  Review of Systems  Constitutional: Negative.   HENT: Negative.    Eyes: Negative.   Respiratory: Negative.    Cardiovascular: Negative.   Gastrointestinal: Negative.   Genitourinary: Negative.   Skin: Negative.   Neurological: Negative.   Psychiatric/Behavioral:   Positive for depression, anxiety.     Blood pressure 130/89, pulse 91, temperature 98.3 F (36.8 C), temperature source Oral, resp. rate 14, height 5' 2.5 (1.588 m), weight 67.1 kg, last menstrual period 02/01/2024, SpO2 100%. Body mass index is 26.64 kg/m.   Treatment Plan Summary: ASSESSMENT: Peggye Poon is an 33 y.o. female who  has a past medical history of Anemia, Anxiety, Blood transfusion without reported diagnosis, Central centrifugal scarring alopecia, Chronic swimmer's ear of right side, Crohn's colitis (HCC), IDA (iron deficiency anemia), Impetigo, Postinflammatory hyperpigmentation, Pre-eclampsia in postpartum period (01/14/2019), Scalp psoriasis, and Seborrheic dermatitis of scalp.  She presented on 04/20/2024  9:15 PM for Bipolar disorder current episode depressed (HCC).  She presented IVC after throwing electronics at one of her children in the presence of a Economist.  She also endorsed suicidal ideation.  Diagnoses / Active Problems: Patient Active Problem List   Diagnosis Date Noted   Polysubstance abuse (HCC) 04/20/2024   Bipolar disorder current episode depressed (HCC) 04/20/2024   Anemia 04/18/2024   Right ear pain 12/27/2023   Arthralgia of right temporomandibular joint 12/27/2023   Allergic rhinitis 12/27/2023   Dysuria 01/11/2023   Anorexia 07/03/2022   Nausea 07/03/2022   Unintentional weight loss 07/03/2022   Anxiety disorder 04/03/2021   Bipolar disorder (HCC) 04/03/2021   Hemorrhoids 12/26/2020   Chest pain of uncertain etiology 12/25/2020   Essential hypertension 12/25/2020   History of pre-eclampsia 12/25/2020   ASCUS of cervix with negative high risk HPV 11/27/2020   Iron deficiency anemia 11/14/2020   Fecal urgency 10/16/2020   Nasal sore 10/16/2020   Chronic swimmer's ear of right side  05/06/2020   Crohn's disease of both small and large intestine with other complication (HCC) 03/08/2020   History of iron deficiency anemia 03/08/2020    Infliximab  (Remicade ) long-term use 03/08/2020   History of immunosuppression therapy 03/08/2020   Generalized abdominal pain 03/08/2020   Bloating symptom 03/08/2020   Chronic diarrhea 03/08/2020   Pre-eclampsia in postpartum period 01/14/2019   NSVD (normal spontaneous vaginal delivery) 12/28/2018   Cyst of ovary 12/21/2016   Crohn's disease (HCC) 11/27/2015   Anal pain 09/11/2013   Skin rash 09/11/2013   Immunosuppression (HCC) 06/07/2013     PLAN: Safety and Monitoring:  -- Involuntary admission to inpatient psychiatric unit for safety, stabilization and treatment  -- Daily contact with patient to assess and evaluate symptoms and progress in treatment  -- Patient's case to be discussed in multi-disciplinary team meeting  -- Observation Level : q15 minute checks  -- Vital signs:  q12 hours  -- Precautions: suicide, elopement, and assault  2. Psychiatric Diagnoses and Treatment:  Patient Active Problem List   Diagnosis Date Noted   Polysubstance abuse (HCC) 04/20/2024   Bipolar disorder current episode depressed (HCC) 04/20/2024   Anemia 04/18/2024   Right ear pain 12/27/2023   Arthralgia of right temporomandibular joint 12/27/2023   Allergic rhinitis 12/27/2023   Dysuria 01/11/2023   Anorexia 07/03/2022   Nausea 07/03/2022   Unintentional weight loss 07/03/2022   Anxiety disorder 04/03/2021   Bipolar disorder (HCC) 04/03/2021   Hemorrhoids 12/26/2020   Chest pain of uncertain etiology 12/25/2020   Essential hypertension 12/25/2020   History of pre-eclampsia 12/25/2020   ASCUS of cervix with negative high risk HPV 11/27/2020   Iron deficiency anemia 11/14/2020   Fecal urgency 10/16/2020   Nasal sore 10/16/2020   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   Crohn's disease of both small and large intestine with other complication (HCC) 03/08/2020   History of iron deficiency anemia 03/08/2020   Infliximab  (Remicade ) long-term use 03/08/2020   History of  immunosuppression therapy 03/08/2020   Generalized abdominal pain 03/08/2020   Bloating symptom 03/08/2020   Chronic diarrhea 03/08/2020   Pre-eclampsia in postpartum period 01/14/2019   NSVD (normal spontaneous vaginal delivery) 12/28/2018   Cyst of ovary 12/21/2016   Crohn's disease (HCC) 11/27/2015   Anal pain 09/11/2013   Skin rash 09/11/2013   Immunosuppression (HCC) 06/07/2013     Scheduled Medications:  naltrexone  25 mg Oral Daily   QUEtiapine  50 mg Oral QHS     As Needed Medications: doxylamine (Sleep)     3. Medical Issues Being Addressed:    Labs reviewed, mostly unremarkable with the exception of mild hyperkalemia, microcytic anemia 9.2, serum pregnancy screen positive, quant 6105, UDS positive for cocaine and THC, BAL of 80   4. Discharge Planning:   -- Social work and case management to assist with discharge planning and identification of hospital follow-up needs prior to discharge  -- Estimated LOS: 5-7 days  -- Discharge Concerns: Need to establish a safety plan; Medication compliance and effectiveness  -- Discharge Goals: Return home with outpatient referrals for mental health follow-up including medication management/psychotherapy  5. Short Term Goals:  Improve ability to identify changes in lifestyle to reduce recurrence of condition, verbalize feelings, disclose and discuss suicidal ideas, demonstrate self-control, identify and develop effective coping behaviors, compliance with prescribed medications, identify triggers associated with substance abuse/mental health issues, participate in unit milieu and in scheduled group therapies   6. Long Term Goals: Improvement in symptoms  so the patient is ready for discharge   --The risks/benefits/side-effects/alternatives to the medications above were discussed in detail with the patient and time was given for questions. The patient provided informed consent.   -- Metabolic profile and EKG monitoring obtained while  on an atypical antipsychotic and listed in the EHR    Total Time Spent in Direct Patient Care:  I personally spent 60 minutes on the unit in direct patient care. The direct patient care time included face-to-face time with the patient, reviewing the patient's chart, communicating with other professionals, and coordinating care. Greater than 50% of this time was spent in counseling or coordinating care with the patient regarding goals of hospitalization, psycho-education, and discharge planning needs.   I certify that inpatient services furnished can reasonably be expected to improve the patient's condition.    Clair Crews, MD 04/21/2024, 5:02 PM      Portions of this note were created using voice recognition software. Minor syntax errors, grammatical content, spelling, or punctuation errors may have occurred unintentionally. Please notify the Bolivar Bushman if the meaning of any statement is unclear. The other day I was trying to find a job and my son broke his gaming monitor and I started breaking stuff

## 2024-04-21 NOTE — BHH Suicide Risk Assessment (Signed)
 [facility] Admission Suicide Risk Assessment   Nursing information obtained from:  Patient Demographic factors:  Living alone, Unemployed, Low socioeconomic status Current Mental Status:  NA Loss Factors:  Financial problems / change in socioeconomic status Historical Factors:  Impulsivity Risk Reduction Factors:  Sense of responsibility to family  Total Time spent with patient: 1 hour Principal Problem: Bipolar disorder current episode depressed (HCC) Diagnosis:  Principal Problem:   Bipolar disorder current episode depressed (HCC)  Subjective Data: Erika Stephens is a 33 y.o. female who has a past medical history of Anemia, Anxiety, Blood transfusion without reported diagnosis, Central centrifugal scarring alopecia, Chronic swimmer's ear of right side, Crohn's colitis (HCC), IDA (iron deficiency anemia), Impetigo, Postinflammatory hyperpigmentation, Pre-eclampsia in postpartum period (01/14/2019), Scalp psoriasis, and Seborrheic dermatitis of scalp. She presented from the ED for Bipolar disorder current episode depressed (HCC) [F31.30].   Continued Clinical Symptoms:  Alcohol Use Disorder Identification Test Final Score (AUDIT): 12 The Alcohol Use Disorders Identification Test, Guidelines for Use in Primary Care, Second Edition.  World Science writer [facility]). Score between 0-7:  no or low risk or alcohol related problems. Score between 8-15:  moderate risk of alcohol related problems. Score between 16-19:  high risk of alcohol related problems. Score 20 or above:  warrants further diagnostic evaluation for alcohol dependence and treatment.   CLINICAL FACTORS:   Severe Anxiety and/or Agitation Bipolar Disorder:   Mixed State Alcohol/Substance Abuse/Dependencies More than one psychiatric diagnosis Unstable or Poor Therapeutic Relationship Previous Psychiatric Diagnoses and Treatments   Musculoskeletal: Strength & Muscle Tone: within normal limits Gait & Station: normal Patient leans:  N/A  Psychiatric Specialty Exam:  Presentation  General Appearance:  Fairly Groomed  Eye Contact: Good  Speech: Clear and Coherent  Speech Volume: Increased  Handedness: Right   Mood and Affect  Mood: Depressed; Anxious; Hopeless; Worthless  Affect: Restricted; Congruent   Thought Process  Thought Processes: Linear  Descriptions of Associations:Intact  Orientation:Full (Time, Place and Person)  Thought Content:Logical  History of Schizophrenia/Schizoaffective disorder:No data recorded Duration of Psychotic Symptoms:No data recorded Hallucinations:Hallucinations: None  Ideas of Reference:None  Suicidal Thoughts:Suicidal Thoughts: No  Homicidal Thoughts:Homicidal Thoughts: No   Sensorium  Memory: Immediate Good; Recent Good  Judgment: Poor  Insight: Fair   Art therapist  Concentration: Fair  Attention Span: Good  Recall: Good  Fund of Knowledge: Good  Language: Good   Psychomotor Activity  Psychomotor Activity: Psychomotor Activity: Restlessness   Assets  Assets: Communication Skills; Desire for Improvement; Housing   Sleep  Sleep: Sleep: Poor    Physical Exam: Physical Exam ROS Blood pressure 130/89, pulse 91, temperature 98.3 F (36.8 C), temperature source Oral, resp. rate 14, height 5' 2.5 (1.588 m), weight 67.1 kg, last menstrual period 02/01/2024, SpO2 100%. Body mass index is 26.64 kg/m.   COGNITIVE FEATURES THAT CONTRIBUTE TO RISK:  Closed-mindedness    SUICIDE RISK:   Moderate:  Frequent suicidal ideation with limited intensity, and duration, some specificity in terms of plans, no associated intent, good self-control, limited dysphoria/symptomatology, some risk factors present, and identifiable protective factors, including available and accessible social support.  PLAN OF CARE: See history and physical  I certify that inpatient services furnished can reasonably be expected to improve the  patient's condition.   Timmothy Foots, MD 04/21/2024, 4:49 PM

## 2024-04-21 NOTE — Group Note (Signed)
 Recreation Therapy Group Note   Group Topic:Problem Solving  Group Date: 04/21/2024 Start Time: 6295 End Time: 1000 Facilitators: Ndea Kilroy-McCall, LRT,CTRS Location: [address] Dayroom   Group Topic: Problem Solving  Goal Area(s) Addresses:  Patient will effectively work in a team with other group members. Patient will verbalize importance of using appropriate problem solving techniques.   Behavioral Response: None  Intervention: Worksheet  Activity: Dentist. Patients were given two worksheets of brain teasers. Patients got 15 minutes to complete the puzzles. Patients could work with each other if they chose to to figure out what each puzzle was. At the end of the 15 minutes, LRT would go over the answers with the group.     Education: Journalist, newspaper, Communication, Team Building  Education Outcome: Acknowledges understanding/In group clarification offered/Needs additional education.    Affect/Mood: Flat   Participation Level: None   Participation Quality: None   Behavior: Disinterested   Speech/Thought Process: None   Insight: None   Judgement: None   Modes of Intervention: Activity   Patient Response to Interventions:  Disengaged   Education Outcome:  In group clarification offered    Clinical Observations/Individualized Feedback: Pt attended group but had no engagement. Pt appeared to have no desire to be in group.    Plan: Continue to engage patient in RT group sessions 2-3x/week.   Kaydon Husby-McCall, LRT,CTRS 04/21/2024 1:04 PM

## 2024-04-21 NOTE — BH IP Treatment Plan (Signed)
 Interdisciplinary Treatment and Diagnostic Plan Update  04/21/2024 Time of Session: 10:50 AM Erika Stephens MRN: [id number]  Principal Diagnosis: Bipolar disorder current episode depressed (HCC)  Secondary Diagnoses: Principal Problem:   Bipolar disorder current episode depressed (HCC)   Current Medications:  Current Facility-Administered Medications  Medication Dose Route Frequency Provider Last Rate Last Admin   doxylamine (Sleep) (UNISOM) tablet 25 mg  25 mg Oral QHS PRN Parker, Alvin S, MD       naltrexone (DEPADE) tablet 25 mg  25 mg Oral Daily Parker, Alvin S, MD   25 mg at 04/21/24 1644   QUEtiapine (SEROQUEL) tablet 50 mg  50 mg Oral QHS Parker, Alvin S, MD       PTA Medications: Medications Prior to Admission  Medication Sig Dispense Refill Last Dose/Taking   Cobalamin Combinations (B-12) [phone] MCG SUBL Place 1 Dose under the tongue daily at 12 noon.      etonogestrel -ethinyl estradiol  (NUVARING) 0.12-0.015 MG/24HR vaginal ring Insert vaginally and leave in place for 3 consecutive weeks, then remove for 1 week. (Patient not taking: Reported on 04/20/2024) 3 each 1    ferrous sulfate  325 (65 FE) MG tablet Take 1 tablet (325 mg total) by mouth daily with breakfast. (Patient not taking: Reported on 04/20/2024) 90 tablet 1    FLUoxetine  (PROZAC ) 20 MG capsule Take 1 capsule (20 mg total) by mouth daily. (Patient not taking: Reported on 04/20/2024) 30 capsule 1    hydrOXYzine  (VISTARIL ) 25 MG capsule Take 1 capsule (25 mg total) by mouth every 8 (eight) hours as needed. (Patient not taking: Reported on 04/20/2024) 30 capsule 1    inFLIXimab  (REMICADE  IV) Inject into the vein. Every 8 weeks      SUMAtriptan  (IMITREX ) 25 MG tablet Take 25 mg (1 tablet total) by mouth at the start of the headache. May repeat in 2 hours x 1 if headache persists. Max of 2 tablets/24 hours. (Patient not taking: Reported on 04/20/2024) 10 tablet 0     Patient Stressors: Legal issue   Marital or family  conflict    Patient Strengths: General fund of knowledge  Motivation for treatment/growth   Treatment Modalities: Medication Management, Group therapy, Case management,  1 to 1 session with clinician, Psychoeducation, Recreational therapy.   Physician Treatment Plan for Primary Diagnosis: Bipolar disorder current episode depressed (HCC) Long Term Goal(s):     Short Term Goals:    Medication Management: Evaluate patient's response, side effects, and tolerance of medication regimen.  Therapeutic Interventions: 1 to 1 sessions, Unit Group sessions and Medication administration.  Evaluation of Outcomes: Not Progressing  Physician Treatment Plan for Secondary Diagnosis: Principal Problem:   Bipolar disorder current episode depressed (HCC)  Long Term Goal(s):     Short Term Goals:       Medication Management: Evaluate patient's response, side effects, and tolerance of medication regimen.  Therapeutic Interventions: 1 to 1 sessions, Unit Group sessions and Medication administration.  Evaluation of Outcomes: Not Progressing   RN Treatment Plan for Primary Diagnosis: Bipolar disorder current episode depressed (HCC) Long Term Goal(s): Knowledge of disease and therapeutic regimen to maintain health will improve  Short Term Goals: Ability to remain free from injury will improve, Ability to verbalize frustration and anger appropriately will improve, Ability to demonstrate self-control, Ability to participate in decision making will improve, Ability to verbalize feelings will improve, Ability to disclose and discuss suicidal ideas, Ability to identify and develop effective coping behaviors will improve, and Compliance with prescribed medications will  improve  Medication Management: RN will administer medications as ordered by provider, will assess and evaluate patient's response and provide education to patient for prescribed medication. RN will report any adverse and/or side effects to  prescribing provider.  Therapeutic Interventions: 1 on 1 counseling sessions, Psychoeducation, Medication administration, Evaluate responses to treatment, Monitor vital signs and CBGs as ordered, Perform/monitor CIWA, COWS, AIMS and Fall Risk screenings as ordered, Perform wound care treatments as ordered.  Evaluation of Outcomes: Not Progressing   LCSW Treatment Plan for Primary Diagnosis: Bipolar disorder current episode depressed (HCC) Long Term Goal(s): Safe transition to appropriate next level of care at discharge, Engage patient in therapeutic group addressing interpersonal concerns.  Short Term Goals: Engage patient in aftercare planning with referrals and resources, Increase social support, Increase ability to appropriately verbalize feelings, Increase emotional regulation, Facilitate acceptance of mental health diagnosis and concerns, Facilitate patient progression through stages of change regarding substance use diagnoses and concerns, Identify triggers associated with mental health/substance abuse issues, and Increase skills for wellness and recovery  Therapeutic Interventions: Assess for all discharge needs, 1 to 1 time with Social worker, Explore available resources and support systems, Assess for adequacy in community support network, Educate family and significant other(s) on suicide prevention, Complete Psychosocial Assessment, Interpersonal group therapy.  Evaluation of Outcomes: Not Progressing   Progress in Treatment: Attending groups: attended some groups Participating in groups: Yes Taking medication as prescribed: Yes. Toleration medication: Yes. Family/Significant other contact made: No, will contact:  Phyliis Cipriano Creeks (grandmother) [phone] Patient understands diagnosis: Yes. Discussing patient identified problems/goals with staff: Yes. Medical problems stabilized or resolved: Yes. Denies suicidal/homicidal ideation: Yes. Issues/concerns per patient  self-inventory: No.  New problem(s) identified:  No  New Short Term/Long Term Goal(s):    medication stabilization, elimination of SI thoughts, development of comprehensive mental wellness plan.   Patient Goals:  How long am I going to stay here?  I need to get back to my responsibilities.  I get easily agitated; I'm pregnant now.  Discharge Plan or Barriers:  Patient recently admitted. CSW will continue to follow and assess for appropriate referrals and possible discharge planning.   Reason for Continuation of Hospitalization: Depression Medication stabilization Suicidal ideation  Estimated Length of Stay:  5 - 7 days  Last 3 [place] Suicide Severity Risk Score: Flowsheet Row [facility] ([facility]19/2024 in [facility] from 03/04/2024 in [facility])  C-SSRS RISK CATEGORY No Risk No Risk No Risk    Last PHQ 2/9 Scores:    09/15/2023   11:03 AM 09/02/2023    3:33 PM 08/25/2023    3:35 PM  Depression screen PHQ 2/9  Decreased Interest 0 0 1  Down, Depressed, Hopeless 2 1 2   PHQ - 2 Score 2 1 3   Altered sleeping 2  3  Tired, decreased energy 1  3  Change in appetite 0  2  Feeling bad or failure about yourself  0  3  Trouble concentrating 1  3  Moving slowly or fidgety/restless 0  0  Suicidal thoughts 0  0  PHQ-9 Score 6  17  Difficult doing work/chores Somewhat difficult Somewhat difficult Somewhat difficult    Scribe for Treatment Team: Shawnita Krizek O Darlinda Bellows, LCSWA 04/21/2024 6:27 PM

## 2024-04-21 NOTE — Plan of Care (Signed)
   Problem: Activity: Goal: Interest or engagement in activities will improve Outcome: Progressing   Problem: Coping: Goal: Ability to demonstrate self-control will improve Outcome: Progressing   Problem: Safety: Goal: Periods of time without injury will increase Outcome: Progressing

## 2024-04-21 NOTE — Group Note (Signed)
 Date:  04/21/2024 Time:  10:22 PM  Group Topic/Focus:  Goals Group:   The focus of this group is to help patients establish daily goals to achieve during treatment and discuss how the patient can incorporate goal setting into their daily lives to aide in recovery. Wrap-Up Group:   The focus of this group is to help patients review their daily goal of treatment and discuss progress on daily workbooks.    Participation Level:  Active  Participation Quality:  Sharing  Affect:  Appropriate  Cognitive:  Appropriate  Insight: Good  Engagement in Group:  Engaged  Modes of Intervention:  Discussion  Additional Comments:    Joann Mu 04/21/2024, 10:22 PM

## 2024-04-22 DIAGNOSIS — F4324 Adjustment disorder with disturbance of conduct: Secondary | ICD-10-CM | POA: Diagnosis not present

## 2024-04-22 MED ORDER — COMPLETENATE 29-1 MG PO CHEW: 1.0000 | CHEWABLE_TABLET | Freq: Every day | ORAL | Status: DC

## 2024-04-22 MED ORDER — VITAMIN B-12 100 MCG PO TABS
100.0000 ug | ORAL_TABLET | Freq: Every day | ORAL | Status: DC
  Administered 2024-04-22 – 2024-04-23 (×2): 100 ug via ORAL
  Filled 2024-04-22 (×2): qty 1

## 2024-04-22 MED ORDER — PRENATAL MULTIVITAMIN CH
1.0000 | ORAL_TABLET | Freq: Every day | ORAL | Status: DC
  Administered 2024-04-22 – 2024-04-23 (×2): 1 via ORAL
  Filled 2024-04-22 (×2): qty 1

## 2024-04-22 NOTE — Progress Notes (Signed)
   04/21/24 2115  Psych Admission Type (Psych Patients Only)  Admission Status Involuntary  Psychosocial Assessment  Patient Complaints Anxiety;Worrying  Eye Contact Fair  Facial Expression Anxious;Worried  Affect Appropriate to circumstance  Surveyor, minerals Activity Slow  Appearance/Hygiene In scrubs  Behavior Characteristics Cooperative  Mood Anxious;Depressed  Thought Process  Coherency Circumstantial  Content Blaming self;Blaming others  Delusions None reported or observed  Perception WDL  Hallucination None reported or observed  Judgment Limited  Confusion None  Danger to Self  Current suicidal ideation? Denies  Danger to Others  Danger to Others None reported or observed

## 2024-04-22 NOTE — Plan of Care (Signed)
   Problem: Education: Goal: Emotional status will improve Outcome: Progressing Goal: Mental status will improve Outcome: Progressing   Problem: Activity: Goal: Interest or engagement in activities will improve Outcome: Progressing
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
## 2024-04-22 NOTE — Plan of Care (Addendum)
 Pt visible in bed on initial approach. Presents irritable, demanding with pressured speech and complaints of fatigue from her night medications on initial interactions. Per pt I'm tired, my body feels jittery, my belly is shaking. I can't even get up, how will I take care of my children. I'm not going to take this medication when I leave here, I got kids, young kid. I need to leave for my  job interview on Monday. Y'all gave me a medication yesterday I threw up, now I can't get out of the bed. Treatment team made aware of pt's concerns. Naltrexone d/c as ordered. Pt tolerates meals and fluids well. Out of bed this afternoon. Off unit with peers for meals and courtyard break; returned without issues. Safety checks maintained at Q 15 minutes intervals without incident. Emotional support, encouragement and reassurance offered.      Problem: Coping: Goal: Ability to verbalize frustrations and anger appropriately will improve Outcome: Progressing   Problem: Physical Regulation: Goal: Ability to maintain clinical measurements within normal limits will improve Outcome: Progressing

## 2024-04-23 DIAGNOSIS — F4324 Adjustment disorder with disturbance of conduct: Secondary | ICD-10-CM | POA: Diagnosis not present

## 2024-04-23 MED ORDER — PRENATAL MULTIVITAMIN CH: 1.0000 | ORAL_TABLET | Freq: Every day | ORAL | 0 refills | Status: DC

## 2024-04-23 MED ORDER — SUMATRIPTAN SUCCINATE 25 MG PO TABS: ORAL_TABLET | ORAL | 0 refills | Status: DC

## 2024-04-23 MED ORDER — METRONIDAZOLE 500 MG PO TABS: 500.0000 mg | ORAL_TABLET | Freq: Two times a day (BID) | ORAL | 0 refills | Status: AC

## 2024-04-23 MED ORDER — METRONIDAZOLE 250 MG PO TABS
500.0000 mg | ORAL_TABLET | Freq: Two times a day (BID) | ORAL | Status: DC
  Administered 2024-04-23: 500 mg via ORAL
  Filled 2024-04-23: qty 2

## 2024-04-23 NOTE — Plan of Care (Signed)
 Nurse discussed anxiety, depression and coping skills with patient.

## 2024-04-23 NOTE — Progress Notes (Signed)
  [facility] Adult Case Management Discharge Plan :  Will you be returning to the same living situation after discharge:  Yes,  pts home At discharge, do you have transportation home?: Yes,  taxi Do you have the ability to pay for your medications: Yes,  medicaid  Release of information consent forms completed and in the chart;  Patient's signature needed at discharge.  Patient to Follow up at:  Follow-up Information     [facility] Go on 05/17/2024.   Why: You have an appointment for medication management services on 05/17/24 at 10:00 am, in person. Contact information: [address] [place] [address] [phone]         [facility] Go on 04/27/2024.   Specialty: Professional Counselor Why: Please go to this provider on 04/27/24 at 9:00 am for an assessment, to obtain therapy services. You may also go on Monday through Friday, from 9 am to 1 pm. Contact information: [address] [place]  [address] [place] [address] [phone]                 Next level of care provider has access to [facility] Link:yes  Safety Planning and Suicide Prevention discussed: No. With the patient unable to contact family     Has patient been referred to the Quitline?: Patient refused referral for treatment  Patient has been referred for addiction treatment: Yes, the patient will follow up with an outpatient provider for substance use disorder. Psychiatrist/APP: patient to schedule appointment  Finis Hugger, LCSW 04/23/2024, 9:31 AM

## 2024-04-23 NOTE — Progress Notes (Signed)
 D:  Patient's self inventory sheet, patient sleeps good, no sleep medication.  Good appetite, normal energy level, good concentration.  Denied depression, hopeless and anxiety.  Denied withdrawals.  Then checked cramping, nausea, discharge pregnant.  Physical problems, painful breasts.  Worst pain #6 in past 24 hours, breasts.  Goal is interview Monday 10:30 am, go home.  Plans to talk to MD.  Does have discharge plans. A:  Medications administered per MD orders.  Emotional support and encouragement given patient. R:  Denied SI and HI, contracts for safety.  Denied A/V hallucinations.

## 2024-04-23 NOTE — Group Note (Signed)
 LCSW Group Therapy Note  Group Date: 04/23/2024 Start Time: 1100 End Time: 1200   Type of Therapy and Topic:  Group Therapy - Healthy vs Unhealthy Coping Skills  Participation Level:  Active   Description of Group The focus of this group was to determine what unhealthy coping techniques typically are used by group members and what healthy coping techniques would be helpful in coping with various problems. Patients were guided in becoming aware of the differences between healthy and unhealthy coping techniques. Patients were asked to identify 2-3 healthy coping skills they would like to learn to use more effectively.  Therapeutic Goals Patients learned that coping is what human beings do all day long to deal with various situations in their lives Patients defined and discussed healthy vs unhealthy coping techniques Patients identified their preferred coping techniques and identified whether these were healthy or unhealthy Patients determined 2-3 healthy coping skills they would like to become more familiar with and use more often. Patients provided support and ideas to each other   Summary of Patient Progress:  During group, patient expressed openly. Patient proved open to input from peers and feedback from CSW. Patient demonstrated proficient insight into the subject matter, was respectful of peers, and participated throughout the entire session.   Therapeutic Modalities Cognitive Behavioral Therapy Motivational Interviewing  Erika Stephens, LCSWA 04/23/2024  11:51 AM

## 2024-04-23 NOTE — Group Note (Signed)
 Date:  04/23/2024 Time:  9:43 AM  Group Topic/Focus:  Goals Group:   The focus of this group is to help patients establish daily goals to achieve during treatment and discuss how the patient can incorporate goal setting into their daily lives to aide in recovery.    Participation Level:  Active  Participation Quality:  Appropriate  Affect:  Appropriate  Cognitive:  Appropriate  Insight: Appropriate  Engagement in Group:  Engaged  Modes of Intervention:  Orientation  Additional Comments:  Goal is to discharge  Erika Stephens 04/23/2024, 9:43 AM

## 2024-04-23 NOTE — Progress Notes (Signed)
 Discharge Note:  Patient discharged home with taxi voucher.  Suicide prevention information given and discussed with patient who stated she understood and had no questions.  Denied SI and HI.  Denied A/V hallucinations.  Patient stated she received all her belongings, clothing, toiletries, misc items, etc.  Patient stated she appreciated all assistance received from [facility] staff.  All required discharge information given.

## 2024-04-23 NOTE — BHH Suicide Risk Assessment (Signed)
 [facility] Discharge Suicide Risk Assessment   Principal Problem: Adjustment disorder with disturbance of conduct Discharge Diagnoses: Principal Problem:   Adjustment disorder with disturbance of conduct   Total Time spent with patient: 15 minutes  Musculoskeletal: Strength & Muscle Tone: within normal limits Gait & Station: normal Patient leans: N/A  Psychiatric Specialty Exam  Presentation  General Appearance:  Casual; Well Groomed  Eye Contact: Good  Speech: Clear and Coherent; Normal Rate  Speech Volume: Normal  Handedness: Right   Mood and Affect  Mood: Euthymic  Duration of Depression Symptoms: No data recorded Affect: Appropriate; Congruent; Full Range   Thought Process  Thought Processes: Coherent; Goal Directed; Linear  Descriptions of Associations:Intact  Orientation:Full (Time, Place and Person)  Thought Content:Logical  History of Schizophrenia/Schizoaffective disorder:No data recorded Duration of Psychotic Symptoms:No data recorded Hallucinations:Hallucinations: None  Ideas of Reference:None  Suicidal Thoughts:Suicidal Thoughts: No  Homicidal Thoughts:Homicidal Thoughts: No   Sensorium  Memory: Immediate Good; Recent Good  Judgment: Fair  Insight: Good   Executive Functions  Concentration: Fair  Attention Span: Good  Recall: Good  Fund of Knowledge: Good  Language: Good   Psychomotor Activity  Psychomotor Activity:No data recorded  Assets  Assets: Communication Skills; Desire for Improvement; Housing   Sleep  Sleep: Sleep: Good  Estimated Sleeping Duration (Last 24 Hours): 8.25-8.50 hours  Physical Exam: Physical Exam ROS Blood pressure 108/76, pulse 72, temperature 98.4 F (36.9 C), temperature source Oral, resp. rate 18, height 5' 2.5 (1.588 m), weight 67.1 kg, last menstrual period 02/01/2024, SpO2 100%. Body mass index is 26.64 kg/m.  Mental Status Per Nursing Assessment::   On Admission:   NA  Demographic Factors:  Single  Loss Factors: Legal issues  Historical Factors: Impulsivity  Risk Reduction Factors:   Pregnancy, Responsible for children under 77 years of age, Employed, and Positive social support  Continued Clinical Symptoms:  Severe Anxiety and/or Agitation  Cognitive Features That Contribute To Risk:  None    Suicide Risk:  Mild:  Suicidal ideation of limited frequency, intensity, duration, and specificity.  There are no identifiable plans, no associated intent, mild dysphoria and related symptoms, good self-control (both objective and subjective assessment), few other risk factors, and identifiable protective factors, including available and accessible social support.   Follow-up Information     [facility] Go on 05/17/2024.   Why: You have an appointment for medication management services on 05/17/24 at 10:00 am, in person. Contact information: [address] [place] [address] [phone]         [facility] Go on 04/27/2024.   Specialty: Professional Counselor Why: Please go to this provider on 04/27/24 at 9:00 am for an assessment, to obtain therapy services. You may also go on Monday through Friday, from 9 am to 1 pm. Contact information: [address]  [address] [place] [address] [phone]                 Plan Of Care/Follow-up recommendations:  Follow-up with PCP/OB for new pregnancy; Follow-up with therapy referral  Jhonny Moss, MD 04/23/2024, 9:59 AM

## 2024-04-23 NOTE — Progress Notes (Signed)
   04/22/24 2317  Psych Admission Type (Psych Patients Only)  Admission Status Voluntary  Psychosocial Assessment  Patient Complaints Anxiety;Depression  Eye Contact Fair  Facial Expression Anxious;Sad  Affect Appropriate to circumstance  Speech Logical/coherent  Interaction Assertive  Motor Activity Slow  Appearance/Hygiene In scrubs  Behavior Characteristics Cooperative  Mood Anxious;Depressed  Thought Process  Coherency Circumstantial  Delusions WDL  Perception WDL  Hallucination None reported or observed  Judgment Limited  Confusion None  Danger to Self  Current suicidal ideation? Denies  Danger to Others  Danger to Others None reported or observed
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
## 2024-05-22 ENCOUNTER — Other Ambulatory Visit (INDEPENDENT_AMBULATORY_CARE_PROVIDER_SITE_OTHER)

## 2024-05-22 DIAGNOSIS — D649 Anemia, unspecified: Secondary | ICD-10-CM | POA: Diagnosis not present

## 2024-05-22 DIAGNOSIS — K50818 Crohn's disease of both small and large intestine with other complication: Secondary | ICD-10-CM

## 2024-05-22 LAB — FOLATE: Folate: 12.8 ng/mL (ref >5.9)

## 2024-05-22 LAB — IBC + FERRITIN
Ferritin: 2.5 ng/mL — ABNORMAL LOW (ref 10.0–291.0)
Iron: 15 ug/dL — ABNORMAL LOW (ref 42–145)
Saturation Ratios: 3.6 % — ABNORMAL LOW (ref 20.0–50.0)
TIBC: 413 ug/dL (ref 250.0–450.0)
Transferrin: 295 mg/dL (ref 212.0–360.0)

## 2024-05-22 LAB — VITAMIN B12: Vitamin B-12: 453 pg/mL (ref 211–911)

## 2024-05-23 ENCOUNTER — Telehealth: Payer: Self-pay | Admitting: Physician Assistant

## 2024-05-23 ENCOUNTER — Other Ambulatory Visit: Payer: Self-pay | Admitting: Physician Assistant

## 2024-05-23 ENCOUNTER — Ambulatory Visit: Payer: Self-pay | Admitting: Gastroenterology

## 2024-05-23 NOTE — Telephone Encounter (Signed)
 Erika Stephens called in to re-establish care. [name] will schedule her.

## 2024-05-23 NOTE — Telephone Encounter (Signed)
 Inbound call from patient requesting a call to discuss results further and next steps. Please advise, thank you

## 2024-05-24 ENCOUNTER — Telehealth: Payer: Self-pay

## 2024-05-24 DIAGNOSIS — M79661 Pain in right lower leg: Secondary | ICD-10-CM | POA: Diagnosis not present

## 2024-05-24 DIAGNOSIS — I87391 Chronic venous hypertension (idiopathic) with other complications of right lower extremity: Secondary | ICD-10-CM | POA: Diagnosis not present

## 2024-05-24 DIAGNOSIS — I83891 Varicose veins of right lower extremities with other complications: Secondary | ICD-10-CM | POA: Diagnosis not present

## 2024-05-24 DIAGNOSIS — R252 Cramp and spasm: Secondary | ICD-10-CM | POA: Diagnosis not present

## 2024-05-24 NOTE — Telephone Encounter (Signed)
 Patient called in inquiring about her appointment for iron infusions. Informed patient that she spoke with Reena, a scheduler, yesterday, and was told that a call would be made to schedule her appointment. Patient voiced understanding.

## 2024-05-25 ENCOUNTER — Telehealth: Payer: Self-pay | Admitting: Physician Assistant

## 2024-05-25 NOTE — Telephone Encounter (Signed)
 Scheduled appointments with the patient and she is aware.

## 2024-05-30 ENCOUNTER — Telehealth: Payer: Self-pay

## 2024-05-30 NOTE — Telephone Encounter (Signed)
 Patient is requesting orders to have a Remicade  Injection done at the patient care center. Patient care center advised once orders are in they will call to schedule   Copied from CRM #[id number]. Topic: Clinical - Request for Lab/Test Order >> May 30, 2024  1:40 PM Willma R wrote: Reason for CRM: Patient is requesting orders to have a Remicade  Injection done at the patient care center. Patient care center advised once orders are in they will call to schedule.  Patient can be reached at [phone]

## 2024-06-06 NOTE — Telephone Encounter (Signed)
 Per chart patient has not seen GI since 12/2022.  Routing encounter to that office as well.   Thank you!

## 2024-06-06 NOTE — Telephone Encounter (Signed)
 Patient called to check on status of order for Remicade . She cannot schedule the infusion until it has been ordered.  Thank you!

## 2024-06-06 NOTE — Telephone Encounter (Signed)
 Dr Erika Stephens the pt called patient care center to make appt for Remicade .  Looks like she has not been getting regularly.  She now wants to change location.  Do you want her to come in for an appt prior to ordering any further Remicade ?

## 2024-06-06 NOTE — Progress Notes (Signed)
 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  Last dose was giving in October 09/03/22   INTERVAL HISTORY: Erika Stephens 33 y.o. female returns to the clinic today for a follow up visit. The patient is followed for iron deficiency anemia. She was last seen on 01/11/23. She had been lost to follow up since that time. Her last iron infusion was on 03/17/23 with feraheme .   She follows with Dr. Wilhelmenia from [facility] gastroenterology for her history of Crohn's disease status post appendectomy and ileocolectomy. They performed labs recently which showed normal folate, normal B12, but anemia and iron deficiency with low iron at 15, low ferritin at 2.5, low saturation of 3.6. She also receives Remicade  for her Crohn's disease.   She overall has fatigue. She denies visible bleeding besides her regular menstrual cycles which are not heavy.  She reports shortness of breath with exertion/decreased exercise. She is here for evaluation and repeat blood work.   It was noted that the patient is pregnant during a hospital admission in June. She tells me she had an abortion at [facility] in June. She states she had a period since that time. She does not have a gynecologist.    MEDICAL HISTORY: Past Medical History:  Diagnosis Date   Anemia    Anxiety    Blood transfusion without reported diagnosis    Central centrifugal scarring alopecia    Chronic swimmer's ear of right side    Crohn's colitis (HCC)    IDA (iron deficiency anemia)    Impetigo    Postinflammatory hyperpigmentation    Pre-eclampsia in postpartum  period 01/14/2019   Scalp psoriasis    Seborrheic dermatitis of scalp     ALLERGIES:  is allergic to adalimumab.  MEDICATIONS:  Current Outpatient Medications  Medication Sig Dispense Refill   Cobalamin Combinations (B-12) [phone] MCG SUBL Place 1 Dose under the tongue daily at 12 noon.     inFLIXimab  (REMICADE  IV) Inject into the vein. Every 8 weeks     Prenatal Vit-Fe Fumarate-FA (PRENATAL MULTIVITAMIN) TABS tablet Take 1 tablet by mouth daily at 12 noon. 30 tablet 0   SUMAtriptan  (IMITREX ) 25 MG tablet Take 25 mg (1 tablet total) by mouth at the start of the headache. May repeat in 2 hours x 1 if headache persists. Max of 2 tablets/24 hours. 10 tablet 0   No current facility-administered medications for this visit.    SURGICAL HISTORY:  Past Surgical History:  Procedure Laterality Date   APPENDECTOMY     BOWEL RESECTION     COLONOSCOPY     UPPER GASTROINTESTINAL ENDOSCOPY      REVIEW OF SYSTEMS:   Review of Systems  Constitutional: Positive for fatigue. Negative for appetite change, chills, fever and unexpected weight change.  HENT: Negative for mouth sores, nosebleeds, sore throat and trouble swallowing.   Eyes: Negative for eye problems and icterus.  Respiratory: Positive for dyspnea on exertion. Negative for cough, hemoptysis, and wheezing.   Cardiovascular: Negative for chest pain and leg swelling.  Gastrointestinal: Negative for abdominal pain, constipation, diarrhea, nausea and vomiting.  Genitourinary: Negative for bladder incontinence, difficulty urinating, dysuria, frequency and hematuria.  Musculoskeletal: Negative for back pain, gait problem, neck pain and neck stiffness.  Skin: Negative for itching and rash.  Neurological: Negative for dizziness, extremity weakness, gait problem, headaches, light-headedness and seizures.  Hematological: Negative for adenopathy. Does not bruise/bleed easily.  Psychiatric/Behavioral: Negative for confusion, depression and sleep  disturbance. The patient is not nervous/anxious.     PHYSICAL EXAMINATION:  Last menstrual period 02/01/2024.  ECOG PERFORMANCE STATUS: 1  Physical Exam  Constitutional: Oriented to person, place, and time and well-developed, well-nourished, and in no distress.  HENT:  Head: Normocephalic and atraumatic.  Mouth/Throat: Oropharynx is clear and moist. No oropharyngeal exudate.  Eyes: Conjunctivae are normal. Right eye exhibits no discharge. Left eye exhibits no discharge. No scleral icterus.  Neck: Normal range of motion. Neck supple.  Cardiovascular: Normal rate, regular rhythm, normal heart sounds and intact distal pulses.   Pulmonary/Chest: Effort normal and breath sounds normal. No respiratory distress. No wheezes. No rales.  Abdominal: Soft. Bowel sounds are normal. Exhibits no distension and no mass. There is no tenderness.  Musculoskeletal: Normal range of motion. Exhibits no edema.  Lymphadenopathy:    No cervical adenopathy.  Neurological: Alert and oriented to person, place, and time. Exhibits normal muscle tone. Gait normal. Coordination normal.  Skin: Skin is warm and dry. No rash noted. Not diaphoretic. No erythema. No pallor.  Psychiatric: Mood, memory and judgment normal.  Vitals reviewed.  LABORATORY DATA: Lab Results  Component Value Date   WBC 11.1 (H) 04/19/2024   HGB 9.2 (L) 04/19/2024   HCT 31.5 (L) 04/19/2024   MCV 73.9 (L) 04/19/2024   PLT 412 (H) 04/19/2024      Chemistry      Component Value Date/Time   NA 135 04/19/2024 2123   NA 138 09/02/2023 1600   K 3.4 (L) 04/20/2024 1525   CL 106 04/19/2024 2123   CO2 18 (L) 04/19/2024 2123   BUN 9 04/19/2024 2123   BUN 15 09/02/2023 1600   CREATININE 0.77 04/19/2024 2123   CREATININE 0.84 11/14/2020 1239      Component Value Date/Time   CALCIUM 9.2 04/19/2024 2123   ALKPHOS 60 04/19/2024 2123   AST 22 04/19/2024 2123   AST 21 11/14/2020 1239   ALT 14 04/19/2024 2123   ALT 15 11/14/2020 1239    BILITOT 0.6 04/19/2024 2123   BILITOT 0.3 09/02/2023 1600   BILITOT 0.4 11/14/2020 1239       RADIOGRAPHIC STUDIES:  No results found.   ASSESSMENT/PLAN:  This is a very pleasant 33 years old African-American female with history of iron deficiency anemia secondary to menorrhagia as well as gastrointestinal blood loss.      The patient also has history of Crohn's disease and currently on treatment with Remicade . She has a history of vitamin B12 deficiency.    She receives IV iron infusion with fereheme, most recent being on 03/13/23. She cannot tolerate iron supplements.    The patient had repeat labs today with CBC.   We will arrange for urine pregnancy test today to confirm that she is not pregnant. If she is not pregnant, then we will proceed with iron infusion as scheduled. If positive, we will cancel until we can get confirmatory test.    Her CBC shows microcytic anemia.    We will see her back for a follow up visit in 6 weeks for evaluation and repeat blood work.    She is going to start taking a multivitamin. I will send her integra plus   to take 1 tablet p.o. daily  The patient was advised to call immediately if she has any concerning symptoms in the interval. The patient voices understanding of current disease status and treatment options and is in agreement with the current care plan. All questions were answered. The patient knows to call the clinic with any problems, questions or concerns. We can certainly see the patient much sooner if necessary    No orders of the defined types were placed in this encounter.    The total time spent in the appointment was 30-39 minutes  Waylin Dorko L Kalieb Freeland, PA-C 06/06/24

## 2024-06-06 NOTE — Telephone Encounter (Signed)
 Left message on machine to call back which location does pt prefer Remicade 

## 2024-06-06 NOTE — Telephone Encounter (Signed)
 Looks like she has appointment this week with me. Can we try to get the orders in place for whichever location she wants to get this done? We will get her updated labs as necessary at her clinic visit. Thanks. GM

## 2024-06-06 NOTE — Telephone Encounter (Signed)
 Copied from CRM [id number]. Topic: Appointments - Scheduling Inquiry for Clinic >> Jun 06, 2024  3:35 PM Precious C wrote: Reason for CRM: Patient called in to reschedule her Remicade  infusion injections. She expressed frustration, stating that she always has issues when trying to schedule these appointments. I informed the patient that her PCP, Dr. Tanda, is not the provider who ordered the infusions-it was her gastroenterologist, which the patient acknowledged. She mentioned she typically receives the infusions at [facility] on the third floor. I attempted to reach CAL to assist further but was unable to get in contact. I informed the patient of this and also offered the contact number for her gastroenterology office. Patient insisted Dr. Tanda is her provider and that she just wants to reschedule, though she did not confirm that Dr. Tanda originally ordered the medication. The patient became upset and disconnected the call.

## 2024-06-07 NOTE — Telephone Encounter (Signed)
 Spoke with the pt and she tells me that she wants to continue at the infusion center and does not want to change to patient care center. She just needs an appt.

## 2024-06-07 NOTE — Telephone Encounter (Signed)
 She doesn't want the pt care center she wants to stay at the [facility] infusion center

## 2024-06-07 NOTE — Telephone Encounter (Signed)
 I am confused on what she wants. She told me that she wants to stay at the [facility] infusion center but I see she told you patient care center.  I specifically asked her the address.

## 2024-06-07 NOTE — Telephone Encounter (Signed)
 Spoke to patient to clarify where she wants infusion, she wants it done at the [facility]

## 2024-06-07 NOTE — Telephone Encounter (Signed)
 I have sent a message to the front desk at the Patient Care Center to have them call the patient for scheduling.

## 2024-06-09 ENCOUNTER — Other Ambulatory Visit: Payer: Self-pay

## 2024-06-09 ENCOUNTER — Ambulatory Visit: Admitting: Gastroenterology

## 2024-06-09 DIAGNOSIS — D509 Iron deficiency anemia, unspecified: Secondary | ICD-10-CM

## 2024-06-12 ENCOUNTER — Encounter: Payer: Self-pay | Admitting: Physician Assistant

## 2024-06-12 ENCOUNTER — Other Ambulatory Visit: Payer: Self-pay | Admitting: Physician Assistant

## 2024-06-12 ENCOUNTER — Inpatient Hospital Stay: Payer: Self-pay

## 2024-06-12 ENCOUNTER — Inpatient Hospital Stay: Payer: Self-pay | Attending: Physician Assistant

## 2024-06-12 ENCOUNTER — Inpatient Hospital Stay: Admitting: Physician Assistant

## 2024-06-12 VITALS — BP 132/84 | HR 66 | Temp 97.8°F | Resp 17 | Ht 62.5 in | Wt 140.9 lb

## 2024-06-12 VITALS — BP 111/71 | HR 81 | Temp 98.7°F | Resp 15

## 2024-06-12 DIAGNOSIS — N920 Excessive and frequent menstruation with regular cycle: Secondary | ICD-10-CM | POA: Diagnosis present

## 2024-06-12 DIAGNOSIS — D509 Iron deficiency anemia, unspecified: Secondary | ICD-10-CM

## 2024-06-12 DIAGNOSIS — D500 Iron deficiency anemia secondary to blood loss (chronic): Secondary | ICD-10-CM

## 2024-06-12 DIAGNOSIS — E538 Deficiency of other specified B group vitamins: Secondary | ICD-10-CM | POA: Insufficient documentation

## 2024-06-12 LAB — CBC WITH DIFFERENTIAL (CANCER CENTER ONLY)
Abs Immature Granulocytes: 0.01 K/uL (ref 0.00–0.07)
Basophils Absolute: 0.1 K/uL (ref 0.0–0.1)
Basophils Relative: 1 %
Eosinophils Absolute: 0.5 K/uL (ref 0.0–0.5)
Eosinophils Relative: 6 %
HCT: 29.6 % — ABNORMAL LOW (ref 36.0–46.0)
Hemoglobin: 8.6 g/dL — ABNORMAL LOW (ref 12.0–15.0)
Immature Granulocytes: 0 %
Lymphocytes Relative: 40 %
Lymphs Abs: 3.5 K/uL (ref 0.7–4.0)
MCH: 20.9 pg — ABNORMAL LOW (ref 26.0–34.0)
MCHC: 29.1 g/dL — ABNORMAL LOW (ref 30.0–36.0)
MCV: 71.8 fL — ABNORMAL LOW (ref 80.0–100.0)
Monocytes Absolute: 0.8 K/uL (ref 0.1–1.0)
Monocytes Relative: 9 %
Neutro Abs: 3.9 K/uL (ref 1.7–7.7)
Neutrophils Relative %: 44 %
Platelet Count: 335 K/uL (ref 150–400)
RBC: 4.12 MIL/uL (ref 3.87–5.11)
RDW: 17.2 % — ABNORMAL HIGH (ref 11.5–15.5)
WBC Count: 8.7 K/uL (ref 4.0–10.5)
nRBC: 0 % (ref 0.0–0.2)

## 2024-06-12 LAB — SAMPLE TO BLOOD BANK

## 2024-06-12 LAB — PREGNANCY, URINE: Preg Test, Ur: NEGATIVE

## 2024-06-12 MED ORDER — SODIUM CHLORIDE 0.9 % IV SOLN: INTRAVENOUS | Status: DC

## 2024-06-12 MED ORDER — DIPHENHYDRAMINE HCL 25 MG PO CAPS
50.0000 mg | ORAL_CAPSULE | Freq: Once | ORAL | Status: AC
  Administered 2024-06-12: 50 mg via ORAL
  Filled 2024-06-12: qty 2

## 2024-06-12 MED ORDER — ACETAMINOPHEN 325 MG PO TABS
650.0000 mg | ORAL_TABLET | Freq: Once | ORAL | Status: AC
  Administered 2024-06-12: 650 mg via ORAL
  Filled 2024-06-12: qty 2

## 2024-06-12 MED ORDER — SODIUM CHLORIDE 0.9 % IV SOLN
510.0000 mg | Freq: Once | INTRAVENOUS | Status: AC
  Administered 2024-06-12: 510 mg via INTRAVENOUS
  Filled 2024-06-12: qty 510

## 2024-06-12 MED ORDER — INTEGRA PLUS PO CAPS: 1.0000 | ORAL_CAPSULE | Freq: Every morning | ORAL | 2 refills | Status: AC

## 2024-06-12 MED ORDER — ACETAMINOPHEN 325 MG PO TABS: 650.0000 mg | ORAL_TABLET | Freq: Once | ORAL | Status: DC

## 2024-06-12 MED ORDER — DIPHENHYDRAMINE HCL 25 MG PO CAPS: 50.0000 mg | ORAL_CAPSULE | Freq: Once | ORAL | Status: DC

## 2024-06-12 MED ORDER — SODIUM CHLORIDE 0.9 % IV SOLN: 510.0000 mg | Freq: Once | INTRAVENOUS | Status: DC

## 2024-06-12 NOTE — Progress Notes (Signed)
 Patient observed for 30 minutes following iron infusion.  Tolerated Tx well without incident.  VSS at discharge.  Ambulated to lobby.

## 2024-06-12 NOTE — Patient Instructions (Signed)

## 2024-06-14 ENCOUNTER — Telehealth: Payer: Self-pay | Admitting: Internal Medicine

## 2024-06-14 DIAGNOSIS — I83891 Varicose veins of right lower extremities with other complications: Secondary | ICD-10-CM | POA: Diagnosis not present

## 2024-06-14 NOTE — Telephone Encounter (Signed)
Scheduled appointments with the patient

## 2024-06-15 ENCOUNTER — Other Ambulatory Visit: Payer: Self-pay

## 2024-06-15 ENCOUNTER — Ambulatory Visit
Admission: EM | Admit: 2024-06-15 | Discharge: 2024-06-15 | Disposition: A | Discharge status: Home / Self Care | Attending: Physician Assistant | Admitting: Physician Assistant

## 2024-06-15 ENCOUNTER — Encounter (HOSPITAL_COMMUNITY): Payer: Self-pay

## 2024-06-15 ENCOUNTER — Encounter: Payer: Self-pay | Admitting: Emergency Medicine

## 2024-06-15 ENCOUNTER — Emergency Department (HOSPITAL_COMMUNITY)

## 2024-06-15 ENCOUNTER — Emergency Department (HOSPITAL_COMMUNITY)
Admission: EM | Admit: 2024-06-15 | Discharge: 2024-06-15 | Disposition: A | Discharge status: Home / Self Care | Attending: Emergency Medicine | Admitting: Emergency Medicine

## 2024-06-15 DIAGNOSIS — F1910 Other psychoactive substance abuse, uncomplicated: Secondary | ICD-10-CM

## 2024-06-15 DIAGNOSIS — Z862 Personal history of diseases of the blood and blood-forming organs and certain disorders involving the immune mechanism: Secondary | ICD-10-CM | POA: Diagnosis not present

## 2024-06-15 DIAGNOSIS — R0789 Other chest pain: Secondary | ICD-10-CM | POA: Diagnosis not present

## 2024-06-15 DIAGNOSIS — R002 Palpitations: Secondary | ICD-10-CM | POA: Diagnosis not present

## 2024-06-15 DIAGNOSIS — R202 Paresthesia of skin: Secondary | ICD-10-CM | POA: Diagnosis not present

## 2024-06-15 DIAGNOSIS — R079 Chest pain, unspecified: Secondary | ICD-10-CM

## 2024-06-15 LAB — CBC
HCT: 31.9 % — ABNORMAL LOW (ref 36.0–46.0)
Hemoglobin: 9 g/dL — ABNORMAL LOW (ref 12.0–15.0)
MCH: 21.4 pg — ABNORMAL LOW (ref 26.0–34.0)
MCHC: 28.2 g/dL — ABNORMAL LOW (ref 30.0–36.0)
MCV: 75.8 fL — ABNORMAL LOW (ref 80.0–100.0)
Platelets: 355 K/uL (ref 150–400)
RBC: 4.21 MIL/uL (ref 3.87–5.11)
RDW: 18.6 % — ABNORMAL HIGH (ref 11.5–15.5)
WBC: 11.2 K/uL — ABNORMAL HIGH (ref 4.0–10.5)
nRBC: 0.3 % — ABNORMAL HIGH (ref 0.0–0.2)

## 2024-06-15 LAB — BASIC METABOLIC PANEL WITH GFR
Anion gap: 9 (ref 5–15)
BUN: 12 mg/dL (ref 6–20)
CO2: 22 mmol/L (ref 22–32)
Calcium: 9.6 mg/dL (ref 8.9–10.3)
Chloride: 108 mmol/L (ref 98–111)
Creatinine, Ser: 0.89 mg/dL (ref 0.44–1.00)
GFR, Estimated: >60 mL/min (ref >60)
Glucose, Bld: 96 mg/dL (ref 70–99)
Potassium: 3.2 mmol/L — ABNORMAL LOW (ref 3.5–5.1)
Sodium: 139 mmol/L (ref 135–145)

## 2024-06-15 LAB — HCG, SERUM, QUALITATIVE: Preg, Serum: NEGATIVE

## 2024-06-15 LAB — TROPONIN I (HIGH SENSITIVITY)
Troponin I (High Sensitivity): 3 ng/L (ref <18)
Troponin I (High Sensitivity): 3 ng/L (ref <18)

## 2024-06-15 NOTE — Discharge Instructions (Signed)
 Cocaine is bad for you.  You should stop using it.  Please follow-up with your doctor in the office.  Take 4 over the counter ibuprofen  tablets 3 times a day or 2 over-the-counter naproxen  tablets twice a day for pain. Also take tylenol  1000mg (2 extra strength) four times a day.

## 2024-06-15 NOTE — ED Provider Notes (Signed)
 [place] EMERGENCY DEPARTMENT AT [facility] Provider Note   [name]: [id number] Arrival date & time: 06/15/24  1857     Patient presents with: Chest Pain   Erika Stephens is a 33 y.o. female.   18 yo F with a chief complaint of chest pain.  Left-sided going on for some weeks now.  She tells me that she has been doing cocaine pretty heavily for about 6 months.  She thinks it is making her have symptoms.  She notices it mostly at night when she tries to go to sleep.  Has woken up the last couple nights and felt her left hand was a bit numb.  Just about everything makes it worse.  Nothing seems to make it better.  She denies cough congestion or fever.  Denies trauma.  She went to urgent care and they sent her here for further evaluation.   Chest Pain      Prior to Admission medications   Medication Sig Start Date End Date Taking? Authorizing Provider  Cobalamin Combinations (B-12) [phone] MCG SUBL Place 1 Dose under the tongue daily at 12 noon. Patient not taking: Reported on 06/15/2024    [provider]  FeFum-FePoly-FA-B Cmp-C-Biot (INTEGRA PLUS ) CAPS Take 1 capsule by mouth every morning. 06/12/24   Heilingoetter, Cassandra L, PA-C  inFLIXimab  (REMICADE  IV) Inject into the vein. Every 8 weeks    [provider]  Prenatal Vit-Fe Fumarate-FA (PRENATAL MULTIVITAMIN) TABS tablet Take 1 tablet by mouth daily at 12 noon. Patient not taking: Reported on 06/15/2024 04/23/24   Prentis Kitchens A, DO  SUMAtriptan  (IMITREX ) 25 MG tablet Take 25 mg (1 tablet total) by mouth at the start of the headache. May repeat in 2 hours x 1 if headache persists. Max of 2 tablets/24 hours. Patient not taking: Reported on 06/15/2024 04/23/24   Prentis Kitchens A, DO    Allergies: Adalimumab    Review of Systems  Cardiovascular:  Positive for chest pain.    Updated Vital Signs BP 112/74   Pulse (!) 57   Temp 98.2 F (36.8 C) (Oral)   Resp 14   Ht 5' 2 (1.575 m)   Wt 63.9 kg   LMP  05/24/2024 (Approximate) Comment: Patient had abortion in June 2025 and negative pregnancy test on 06/12/24  SpO2 100%   BMI 25.77 kg/m   Physical Exam Vitals and nursing note reviewed.  Constitutional:      General: She is not in acute distress.    Appearance: She is well-developed. She is not diaphoretic.  HENT:     Head: Normocephalic and atraumatic.  Eyes:     Pupils: Pupils are equal, round, and reactive to light.  Cardiovascular:     Rate and Rhythm: Normal rate and regular rhythm.     Heart sounds: No murmur heard.    No friction rub. No gallop.  Pulmonary:     Effort: Pulmonary effort is normal.     Breath sounds: No wheezing or rales.  Chest:     Chest wall: Tenderness present.     Comments: Pain with palpation of the chest wall mostly on the left side about the mid clavicular line about ribs 4 through 6. Abdominal:     General: There is no distension.     Palpations: Abdomen is soft.     Tenderness: There is no abdominal tenderness.  Musculoskeletal:        General: No tenderness.     Cervical back: Normal range of  motion and neck supple.  Skin:    General: Skin is warm and dry.  Neurological:     Mental Status: She is alert and oriented to person, place, and time.  Psychiatric:        Behavior: Behavior normal.     (all labs ordered are listed, but only abnormal results are displayed) Labs Reviewed  BASIC METABOLIC PANEL WITH GFR - Abnormal; Notable for the following components:      Result Value   Potassium 3.2 (*)    All other components within normal limits  CBC - Abnormal; Notable for the following components:   WBC 11.2 (*)    Hemoglobin 9.0 (*)    HCT 31.9 (*)    MCV 75.8 (*)    MCH 21.4 (*)    MCHC 28.2 (*)    RDW 18.6 (*)    nRBC 0.3 (*)    All other components within normal limits  HCG, SERUM, QUALITATIVE  TROPONIN I (HIGH SENSITIVITY)  TROPONIN I (HIGH SENSITIVITY)    EKG: None  Radiology: DG Chest Portable 1 View Result Date:  06/15/2024 CLINICAL DATA:  Palpitations and chest tightness EXAM: PORTABLE CHEST 1 VIEW COMPARISON:  None Available. FINDINGS: The heart size and mediastinal contours are within normal limits. Both lungs are clear. The visualized skeletal structures are unremarkable. IMPRESSION: No active disease. Electronically Signed   By: Norman Gatlin M.D.   On: 06/15/2024 19:35     Procedures   Medications Ordered in the ED - No data to display                                  Medical Decision Making Amount and/or Complexity of Data Reviewed Labs: ordered. Radiology: ordered. ECG/medicine tests: ordered.   33 yo F with a chief complaint of chest pain.  Going on for a few weeks atypical in nature reproduced on exam.  She does do cocaine regularly.  Will obtain a laboratory evaluation chest x-ray EKG.  Chest x-ray independently interpreted by me without focal infiltrate or pneumothorax.  Troponin negative x 2.  No significant electrolyte abnormalities.  No acute anemia.  Will discharge home.  PCP follow-up.  10:02 PM:  I have discussed the diagnosis/risks/treatment options with the patient.  Evaluation and diagnostic testing in the emergency department does not suggest an emergent condition requiring admission or immediate intervention beyond what has been performed at this time.  They will follow up with PCP. We also discussed returning to the ED immediately if new or worsening sx occur. We discussed the sx which are most concerning (e.g., sudden worsening pain, fever, inability to tolerate by mouth) that necessitate immediate return. Medications administered to the patient during their visit and any new prescriptions provided to the patient are listed below.  Medications given during this visit Medications - No data to display   The patient appears reasonably screen and/or stabilized for discharge and I doubt any other medical condition or other [facility] requiring further screening, evaluation, or  treatment in the ED at this time prior to discharge.       Final diagnoses:  Nonspecific chest pain    ED Discharge Orders     None          Emil Share, DO 06/15/24 2202

## 2024-06-15 NOTE — ED Triage Notes (Signed)
 Pt from [facility] on [facility] via EMS.  Pt reports palpitations, chest tightness.  Pt endorses daily use of cocaine for approximately one month.

## 2024-06-15 NOTE — ED Notes (Signed)
 Patient is being discharged from the Urgent Care and sent to the Emergency Department via EMS transport . Per Billy, PA-C, patient is in need of higher level of care due to need for further evaluation of chest pain and palpitations with prolonged QT. Patient is aware and verbalizes understanding of plan of care.  Vitals:   06/15/24 1740  BP: 119/83  Pulse: 77  Resp: 18  Temp: 98.5 F (36.9 C)  SpO2: 98%

## 2024-06-15 NOTE — ED Triage Notes (Signed)
 Pt reports chest pain (10/10)  concentrated in top of L breast and heart palpitations x3 weeks. Notes it has gotten worse day by day. Pt feels weak and has periods of SOB. Vitals all within normal range in triage. Denies edema In extremities. Notes numbness in L hand and fingers intermittently. Hx of iron infusions for anemia w/ recent hx (04/20/24) of low potassium levels ~2.9/3.4.

## 2024-06-15 NOTE — ED Provider Notes (Signed)
 [name]    [name]: [id number] Arrival date & time: 06/15/24  1731      History   Chief Complaint Chief Complaint  Patient presents with   Chest Pain   Palpitations    HPI Erika Stephens is a 33 y.o. female.   Patient here today for evaluation of chest pain that she has had around the top part of her left breast with heart palpitations for 3 weeks.  She reports that her symptoms are gradually worsening specifically within the last day.  She has had some shortness of breath at times.  She notes she also feels generalized weakness.  She does have some numbness in her left hand and fingers at times.  She also reports some nausea yesterday.  She has had history of anemia and recent iron infusion a little less than 2 months ago.  She also history of hypokalemia.  She does have a history of polysubstance abuse as well and reports that she last used cocaine yesterday.  The history is provided by the patient.  Chest Pain Associated symptoms: palpitations and shortness of breath   Associated symptoms: no abdominal pain, no fever, no nausea and no vomiting   Palpitations Associated symptoms: chest pain and shortness of breath   Associated symptoms: no nausea and no vomiting     Past Medical History:  Diagnosis Date   Anemia    Anxiety    Blood transfusion without reported diagnosis    Central centrifugal scarring alopecia    Chronic swimmer's ear of right side    Crohn's colitis (HCC)    IDA (iron deficiency anemia)    Impetigo    Postinflammatory hyperpigmentation    Pre-eclampsia in postpartum period 01/14/2019   Scalp psoriasis    Seborrheic dermatitis of scalp     Patient Active Problem List   Diagnosis Date Noted   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   Polysubstance abuse (HCC) 04/20/2024   Bipolar disorder current episode depressed (HCC) 04/20/2024   Anemia 04/18/2024   Right ear pain 12/27/2023   Arthralgia of right temporomandibular  joint 12/27/2023   Allergic rhinitis 12/27/2023   Cervical spondylosis 12/07/2023   Cervical radiculopathy 10/18/2023   Spinal stenosis in cervical region 09/28/2023   Neck pain 06/10/2023   Arthralgia of right knee 04/28/2023   Pain of right lower leg 04/20/2023   Pain in joint of left shoulder 04/20/2023   Dysuria 01/11/2023   Anorexia 07/03/2022   Nausea 07/03/2022   Unintentional weight loss 07/03/2022   Anxiety disorder 04/03/2021   Bipolar disorder (HCC) 04/03/2021   Hemorrhoids 12/26/2020   Chest pain of uncertain etiology 12/25/2020   Essential hypertension 12/25/2020   History of pre-eclampsia 12/25/2020   ASCUS of cervix with negative high risk HPV 11/27/2020   Iron deficiency anemia 11/14/2020   Fecal urgency 10/16/2020   Nasal sore 10/16/2020   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   Crohn's disease of both small and large intestine with other complication (HCC) 03/08/2020   History of iron deficiency anemia 03/08/2020   Infliximab  (Remicade ) long-term use 03/08/2020   History of immunosuppression therapy 03/08/2020   Generalized abdominal pain 03/08/2020   Bloating symptom 03/08/2020   Chronic diarrhea 03/08/2020   Pre-eclampsia in postpartum period 01/14/2019   NSVD (normal spontaneous vaginal delivery) 12/28/2018   Cyst of ovary 12/21/2016   Crohn's disease (HCC) 11/27/2015   Anal pain 09/11/2013   Skin rash 09/11/2013   Immunosuppression (HCC) 06/07/2013  Past Surgical History:  Procedure Laterality Date   APPENDECTOMY     BOWEL RESECTION     COLONOSCOPY     UPPER GASTROINTESTINAL ENDOSCOPY      OB History     Gravida  1   Para      Term      Preterm      AB      Living         SAB      IAB      Ectopic      Multiple      Live Births               Home Medications    Prior to Admission medications   Medication Sig Start Date End Date Taking? Authorizing Provider  Cobalamin Combinations (B-12) [phone] MCG  SUBL Place 1 Dose under the tongue daily at 12 noon. Patient not taking: Reported on 06/15/2024    [provider]  FeFum-FePoly-FA-B Cmp-C-Biot (INTEGRA PLUS ) CAPS Take 1 capsule by mouth every morning. 06/12/24   Heilingoetter, Cassandra L, PA-C  inFLIXimab  (REMICADE  IV) Inject into the vein. Every 8 weeks    [provider]  Prenatal Vit-Fe Fumarate-FA (PRENATAL MULTIVITAMIN) TABS tablet Take 1 tablet by mouth daily at 12 noon. Patient not taking: Reported on 06/15/2024 04/23/24   Prentis Kitchens A, DO  SUMAtriptan  (IMITREX ) 25 MG tablet Take 25 mg (1 tablet total) by mouth at the start of the headache. May repeat in 2 hours x 1 if headache persists. Max of 2 tablets/24 hours. Patient not taking: Reported on 06/15/2024 04/23/24   Prentis Kitchens LABOR, DO    Family History Family History  Problem Relation Age of Onset   Crohn's disease Mother    Schizophrenia Father    Bipolar disorder Father    Crohn's disease Maternal Grandmother    Prostate cancer Maternal Uncle    Colon cancer Neg Hx    Stomach cancer Neg Hx    Pancreatic cancer Neg Hx    Esophageal cancer Neg Hx    Inflammatory bowel disease Neg Hx    Liver disease Neg Hx    Rectal cancer Neg Hx    Breast cancer Neg Hx     Social History Social History   Tobacco Use   Smoking status: Some Days    Current packs/day: 0.25    Types: Cigarettes    Passive exposure: Never   Smokeless tobacco: Never  Vaping Use   Vaping status: Never Used  Substance Use Topics   Alcohol use: Yes    Comment: socially.    Drug use: Not Currently    Types: Marijuana, Cocaine    Comment: last use of cocaine: 06/14/24     Allergies   Adalimumab   Review of Systems Review of Systems  Constitutional:  Negative for chills and fever.  Eyes:  Negative for discharge and redness.  Respiratory:  Positive for shortness of breath.   Cardiovascular:  Positive for chest pain and palpitations.  Gastrointestinal:  Negative for abdominal pain,  nausea and vomiting.     Physical Exam Triage Vital Signs ED Triage Vitals [06/15/24 1740]  Encounter Vitals Group     BP 119/83     Girls Systolic BP Percentile      Girls Diastolic BP Percentile      Boys Systolic BP Percentile      Boys Diastolic BP Percentile      Pulse Rate 77     Resp  18     Temp 98.5 F (36.9 C)     Temp Source Oral     SpO2 98 %     Weight      Height      Head Circumference      Peak Flow      Pain Score 10     Pain Loc      Pain Education      Exclude from Growth Chart    No data found.  Updated Vital Signs BP 119/83 (BP Location: Left Arm)   Pulse 77   Temp 98.5 F (36.9 C) (Oral)   Resp 18   LMP 05/24/2024 (Approximate) Comment: Patient had abortion in June 2025 and negative pregnancy test on 06/12/24  SpO2 98%   Visual Acuity Right Eye Distance:   Left Eye Distance:   Bilateral Distance:    Right Eye Near:   Left Eye Near:    Bilateral Near:     Physical Exam Vitals and nursing note reviewed.  Constitutional:      General: She is not in acute distress.    Appearance: Normal appearance. She is not ill-appearing.  HENT:     Head: Normocephalic and atraumatic.  Eyes:     Conjunctiva/sclera: Conjunctivae normal.  Cardiovascular:     Rate and Rhythm: Normal rate and regular rhythm.  Pulmonary:     Effort: Pulmonary effort is normal. No respiratory distress.     Breath sounds: Normal breath sounds. No wheezing, rhonchi or rales.  Neurological:     Mental Status: She is alert.  Psychiatric:        Mood and Affect: Mood normal.        Behavior: Behavior normal.        Thought Content: Thought content normal.      UC Treatments / Results  Labs (all labs ordered are listed, but only abnormal results are displayed) Labs Reviewed - No data to display  EKG   Radiology No results found.  Procedures Procedures (including critical care time)  Medications Ordered in UC Medications - No data to display  Initial  Impression / Assessment and Plan / UC Course  I have reviewed the triage vital signs and the nursing notes.  Pertinent labs & imaging results that were available during my care of the patient were reviewed by me and considered in my medical decision making (see chart for details).    EKG with prolonged QT not seen on EKG from 2 months ago.  Recommended further evaluation in the emergency department given EKG findings and comorbidities as well as symptoms.  Patient does not have anyone to transport her, EMS called for transport to ED.  Patient is agreeable to plan.  Final Clinical Impressions(s) / UC Diagnoses   Final diagnoses:  Chest pain, unspecified type  Palpitations  Polysubstance abuse (HCC)  History of anemia   Discharge Instructions   None    ED Prescriptions   None    PDMP not reviewed this encounter.   Billy Asberry FALCON, PA-C 06/15/24 [phone]

## 2024-06-16 ENCOUNTER — Non-Acute Institutional Stay (HOSPITAL_COMMUNITY)
Admission: RE | Admit: 2024-06-16 | Discharge: 2024-06-16 | Disposition: A | Discharge status: Home / Self Care | Source: Ambulatory Visit | Attending: Internal Medicine | Admitting: Internal Medicine

## 2024-06-16 VITALS — BP 109/67 | HR 55 | Temp 97.8°F | Resp 14 | Wt 137.6 lb

## 2024-06-16 DIAGNOSIS — K50818 Crohn's disease of both small and large intestine with other complication: Secondary | ICD-10-CM | POA: Diagnosis present

## 2024-06-16 DIAGNOSIS — K5090 Crohn's disease, unspecified, without complications: Secondary | ICD-10-CM

## 2024-06-16 LAB — COMPREHENSIVE METABOLIC PANEL WITH GFR
ALT: 15 U/L (ref 0–44)
AST: 38 U/L (ref 15–41)
Albumin: 3.5 g/dL (ref 3.5–5.0)
Alkaline Phosphatase: 63 U/L (ref 38–126)
Anion gap: 14 (ref 5–15)
BUN: 16 mg/dL (ref 6–20)
CO2: 20 mmol/L — ABNORMAL LOW (ref 22–32)
Calcium: 9.4 mg/dL (ref 8.9–10.3)
Chloride: 103 mmol/L (ref 98–111)
Creatinine, Ser: 0.9 mg/dL (ref 0.44–1.00)
GFR, Estimated: >60 mL/min (ref >60)
Glucose, Bld: 142 mg/dL — ABNORMAL HIGH (ref 70–99)
Potassium: 3.5 mmol/L (ref 3.5–5.1)
Sodium: 137 mmol/L (ref 135–145)
Total Bilirubin: 0.7 mg/dL (ref 0.0–1.2)
Total Protein: 7.9 g/dL (ref 6.5–8.1)

## 2024-06-16 LAB — CBC WITH DIFFERENTIAL/PLATELET
Abs Immature Granulocytes: 0.01 K/uL (ref 0.00–0.07)
Basophils Absolute: 0.1 K/uL (ref 0.0–0.1)
Basophils Relative: 1 %
Eosinophils Absolute: 0.6 K/uL — ABNORMAL HIGH (ref 0.0–0.5)
Eosinophils Relative: 8 %
HCT: 31.4 % — ABNORMAL LOW (ref 36.0–46.0)
Hemoglobin: 8.7 g/dL — ABNORMAL LOW (ref 12.0–15.0)
Immature Granulocytes: 0 %
Lymphocytes Relative: 35 %
Lymphs Abs: 2.4 K/uL (ref 0.7–4.0)
MCH: 21.2 pg — ABNORMAL LOW (ref 26.0–34.0)
MCHC: 27.7 g/dL — ABNORMAL LOW (ref 30.0–36.0)
MCV: 76.6 fL — ABNORMAL LOW (ref 80.0–100.0)
Monocytes Absolute: 0.5 K/uL (ref 0.1–1.0)
Monocytes Relative: 7 %
Neutro Abs: 3.4 K/uL (ref 1.7–7.7)
Neutrophils Relative %: 49 %
Platelets: 303 K/uL (ref 150–400)
RBC: 4.1 MIL/uL (ref 3.87–5.11)
RDW: 19.1 % — ABNORMAL HIGH (ref 11.5–15.5)
WBC: 6.9 K/uL (ref 4.0–10.5)
nRBC: 0.4 % — ABNORMAL HIGH (ref 0.0–0.2)

## 2024-06-16 LAB — SEDIMENTATION RATE: Sed Rate: 28 mm/h — ABNORMAL HIGH (ref 0–22)

## 2024-06-16 MED ORDER — DIPHENHYDRAMINE HCL 25 MG PO CAPS
50.0000 mg | ORAL_CAPSULE | Freq: Once | ORAL | Status: AC
  Administered 2024-06-16: 50 mg via ORAL
  Filled 2024-06-16: qty 2

## 2024-06-16 MED ORDER — SODIUM CHLORIDE 0.9 % IV SOLN
700.0000 mg | INTRAVENOUS | Status: DC
  Administered 2024-06-16: 700 mg via INTRAVENOUS
  Filled 2024-06-16: qty 70

## 2024-06-16 MED ORDER — ACETAMINOPHEN 325 MG PO TABS
650.0000 mg | ORAL_TABLET | Freq: Once | ORAL | Status: AC
  Administered 2024-06-16: 650 mg via ORAL
  Filled 2024-06-16: qty 2

## 2024-06-16 MED ORDER — SODIUM CHLORIDE 0.9 % IV SOLN: INTRAVENOUS | Status: DC | PRN

## 2024-06-16 NOTE — Progress Notes (Addendum)
 PATIENT CARE CENTER NOTE   Diagnosis: Crohn's disease of both small and large intestine with other complication ( HCC) (X49.181)     Provider: Aloha Finner, MD     Procedure: Remicade  10mg /kg infusion + labs     Note: Patient received 700mg  Remicade  infusion ( # 3 of 6) via PIV. Pt had a 10-15 pound weight reduction since last infusion, per pharmacy dose will be adjusted with next infusion if pt loses more weight. Dr Finner and RN Patty notified via secure chat of pharmacy's recommendations. Patient premedicated with Tylenol  and Benadryl  PO. Labs collected prior to infusion per orders ( cbc w diff, cmp, crp, sed rate).  Infusion titrated per protocol. Patient tolerated well with no adverse reaction. Vital signs stable. Discharge instructions given. Patient to come back every 8 weeks for infusion, pt instructed to schedule next appointment prior to leaving, verbalized understanding. Alert, oriented and ambulatory at discharge.

## 2024-06-17 LAB — HIGH SENSITIVITY CRP: CRP, High Sensitivity: 2.84 mg/L (ref 0.00–3.00)

## 2024-06-18 ENCOUNTER — Ambulatory Visit: Payer: Self-pay | Admitting: Gastroenterology

## 2024-06-19 ENCOUNTER — Inpatient Hospital Stay: Payer: Self-pay

## 2024-06-19 VITALS — BP 110/76 | HR 69 | Temp 98.3°F | Resp 15 | Wt 142.0 lb

## 2024-06-19 DIAGNOSIS — D509 Iron deficiency anemia, unspecified: Secondary | ICD-10-CM

## 2024-06-19 DIAGNOSIS — D500 Iron deficiency anemia secondary to blood loss (chronic): Secondary | ICD-10-CM | POA: Diagnosis not present

## 2024-06-19 MED ORDER — SODIUM CHLORIDE 0.9 % IV SOLN
INTRAVENOUS | Status: DC
Start: 2024-06-19 — End: 2024-06-19

## 2024-06-19 MED ORDER — SODIUM CHLORIDE 0.9 % IV SOLN
510.0000 mg | Freq: Once | INTRAVENOUS | Status: AC
  Administered 2024-06-19 (×2): 510 mg via INTRAVENOUS
  Filled 2024-06-19: qty 510

## 2024-06-19 MED ORDER — ACETAMINOPHEN 325 MG PO TABS
650.0000 mg | ORAL_TABLET | Freq: Once | ORAL | Status: AC
Start: 2024-06-19 — End: 2024-06-19
  Administered 2024-06-19 (×2): 650 mg via ORAL
  Filled 2024-06-19: qty 2

## 2024-06-19 MED ORDER — DIPHENHYDRAMINE HCL 25 MG PO CAPS
50.0000 mg | ORAL_CAPSULE | Freq: Once | ORAL | Status: AC
  Administered 2024-06-19 (×2): 50 mg via ORAL
  Filled 2024-06-19: qty 2

## 2024-06-19 NOTE — Progress Notes (Signed)
 Pt declined to be observed for 30 minutes post Ferahame infusion. Pt tolerated Tx well w/out incident. VSS at discharge.  Ambulatory to lobby.

## 2024-06-19 NOTE — Patient Instructions (Signed)

## 2024-06-20 DIAGNOSIS — I83891 Varicose veins of right lower extremities with other complications: Secondary | ICD-10-CM | POA: Diagnosis not present

## 2024-06-23 ENCOUNTER — Encounter (HOSPITAL_COMMUNITY)

## 2024-07-02 ENCOUNTER — Emergency Department (HOSPITAL_COMMUNITY)

## 2024-07-02 ENCOUNTER — Emergency Department (HOSPITAL_COMMUNITY)
Admission: EM | Admit: 2024-07-02 | Discharge: 2024-07-02 | Disposition: A | Discharge status: Home / Self Care | Attending: Emergency Medicine | Admitting: Emergency Medicine

## 2024-07-02 ENCOUNTER — Other Ambulatory Visit: Payer: Self-pay

## 2024-07-02 ENCOUNTER — Encounter (HOSPITAL_COMMUNITY): Payer: Self-pay

## 2024-07-02 DIAGNOSIS — E041 Nontoxic single thyroid nodule: Secondary | ICD-10-CM | POA: Insufficient documentation

## 2024-07-02 DIAGNOSIS — W458XXA Other foreign body or object entering through skin, initial encounter: Secondary | ICD-10-CM | POA: Insufficient documentation

## 2024-07-02 DIAGNOSIS — T189XXA Foreign body of alimentary tract, part unspecified, initial encounter: Secondary | ICD-10-CM | POA: Insufficient documentation

## 2024-07-02 LAB — BASIC METABOLIC PANEL WITH GFR
Anion gap: 9 (ref 5–15)
BUN: 9 mg/dL (ref 6–20)
CO2: 21 mmol/L — ABNORMAL LOW (ref 22–32)
Calcium: 9.5 mg/dL (ref 8.9–10.3)
Chloride: 105 mmol/L (ref 98–111)
Creatinine, Ser: 0.7 mg/dL (ref 0.44–1.00)
GFR, Estimated: >60 mL/min (ref >60)
Glucose, Bld: 98 mg/dL (ref 70–99)
Potassium: 3.6 mmol/L (ref 3.5–5.1)
Sodium: 135 mmol/L (ref 135–145)

## 2024-07-02 LAB — CBC WITH DIFFERENTIAL/PLATELET
Basophils Absolute: 0.1 K/uL (ref 0.0–0.1)
Basophils Relative: 1 %
Eosinophils Absolute: 0.3 K/uL (ref 0.0–0.5)
Eosinophils Relative: 4 %
HCT: 37.3 % (ref 36.0–46.0)
Hemoglobin: 11.3 g/dL — ABNORMAL LOW (ref 12.0–15.0)
Lymphocytes Relative: 44 %
Lymphs Abs: 3.3 K/uL (ref 0.7–4.0)
MCH: 25.2 pg — ABNORMAL LOW (ref 26.0–34.0)
MCHC: 30.3 g/dL (ref 30.0–36.0)
MCV: 83.3 fL (ref 80.0–100.0)
Monocytes Absolute: 0.4 K/uL (ref 0.1–1.0)
Monocytes Relative: 5 %
Neutro Abs: 3.5 K/uL (ref 1.7–7.7)
Neutrophils Relative %: 46 %
Platelets: 289 K/uL (ref 150–400)
RBC: 4.48 MIL/uL (ref 3.87–5.11)
WBC: 7.5 K/uL (ref 4.0–10.5)
nRBC: 0 % (ref 0.0–0.2)

## 2024-07-02 LAB — POC URINE PREG, ED: Preg Test, Ur: NEGATIVE

## 2024-07-02 MED ORDER — LIDOCAINE VISCOUS HCL 2 % MT SOLN
15.0000 mL | Freq: Once | OROMUCOSAL | Status: AC
  Administered 2024-07-02: 15 mL via OROMUCOSAL
  Filled 2024-07-02: qty 15

## 2024-07-02 MED ORDER — IOHEXOL 350 MG/ML SOLN
60.0000 mL | Freq: Once | INTRAVENOUS | Status: AC | PRN
  Administered 2024-07-02: 60 mL via INTRAVENOUS

## 2024-07-02 MED ORDER — DEXAMETHASONE SODIUM PHOSPHATE 10 MG/ML IJ SOLN
10.0000 mg | Freq: Once | INTRAMUSCULAR | Status: AC
  Administered 2024-07-02: 10 mg via INTRAVENOUS
  Filled 2024-07-02: qty 1

## 2024-07-02 MED ORDER — ALUM & MAG HYDROXIDE-SIMETH 200-200-20 MG/5ML PO SUSP
30.0000 mL | Freq: Once | ORAL | Status: AC
  Administered 2024-07-02: 30 mL via ORAL
  Filled 2024-07-02: qty 30

## 2024-07-02 NOTE — ED Provider Notes (Signed)
 [place] EMERGENCY DEPARTMENT AT [facility] Provider Note   [name]: [id number] Arrival date & time: 07/02/24  1022     Patient presents with: Swallowed Foreign Body   Erika Stephens is a 33 y.o. female with a past medical history significant for Crohn's disease, anxiety, and bipolar disorder who presents to the ED with concerns about a possible fishbone in her throat.  Patient ate fish last night around 6:30 PM and has felt a bone in her throat ever since.  Patient notes she has made herself throw up numerous times throughout the night with no improvement in symptoms.  Patient states she is still able to eat and drink normally.  Notes she feels extremely anxious about possible bone in throat. Denies difficulties breathing.   History obtained from patient and past medical records. No interpreter used during encounter.       Prior to Admission medications   Medication Sig Start Date End Date Taking? Authorizing Provider  Cobalamin Combinations (B-12) [phone] MCG SUBL Place 1 Dose under the tongue daily at 12 noon. Patient not taking: Reported on 06/15/2024    [provider]  FeFum-FePoly-FA-B Cmp-C-Biot (INTEGRA PLUS ) CAPS Take 1 capsule by mouth every morning. 06/12/24   Heilingoetter, Cassandra L, PA-C  inFLIXimab  (REMICADE  IV) Inject into the vein. Every 8 weeks    [provider]  Prenatal Vit-Fe Fumarate-FA (PRENATAL MULTIVITAMIN) TABS tablet Take 1 tablet by mouth daily at 12 noon. Patient not taking: Reported on 06/15/2024 04/23/24   Prentis Oliva LABOR, DO  SUMAtriptan  (IMITREX ) 25 MG tablet Take 25 mg (1 tablet total) by mouth at the start of the headache. May repeat in 2 hours x 1 if headache persists. Max of 2 tablets/24 hours. Patient not taking: Reported on 06/15/2024 04/23/24   Prentis Oliva A, DO    Allergies: Adalimumab    Review of Systems  HENT:  Positive for sore throat. Negative for trouble swallowing and voice change.   Respiratory:  Negative for  shortness of breath.     Updated Vital Signs BP 100/66   Pulse 65   Temp 98.4 F (36.9 C)   Resp 16   Ht 5' 2 (1.575 m)   Wt 64 kg   LMP 06/08/2024 (Approximate) Comment: Patient had abortion in June 2025 and negative pregnancy test on 06/12/24  SpO2 97%   BMI 25.81 kg/m   Physical Exam Vitals and nursing note reviewed.  Constitutional:      General: She is not in acute distress.    Appearance: She is not ill-appearing.  HENT:     Head: Normocephalic.     Mouth/Throat:     Comments: Tolerating oral secretions without difficulty.  Eyes:     Pupils: Pupils are equal, round, and reactive to light.  Cardiovascular:     Rate and Rhythm: Normal rate and regular rhythm.     Pulses: Normal pulses.     Heart sounds: Normal heart sounds. No murmur heard.    No friction rub. No gallop.  Pulmonary:     Effort: Pulmonary effort is normal.     Breath sounds: Normal breath sounds.     Comments: Respirations equal and unlabored, patient able to speak in full sentences, lungs clear to auscultation bilaterally Abdominal:     General: Abdomen is flat. There is no distension.     Palpations: Abdomen is soft.     Tenderness: There is no abdominal tenderness. There is no guarding or rebound.  Musculoskeletal:  General: Normal range of motion.     Cervical back: Neck supple.  Skin:    General: Skin is warm and dry.  Neurological:     General: No focal deficit present.     Mental Status: She is alert.  Psychiatric:        Mood and Affect: Mood normal.        Behavior: Behavior normal.     (all labs ordered are listed, but only abnormal results are displayed) Labs Reviewed  CBC WITH DIFFERENTIAL/PLATELET - Abnormal; Notable for the following components:      Result Value   Hemoglobin 11.3 (*)    MCH 25.2 (*)    All other components within normal limits  BASIC METABOLIC PANEL WITH GFR - Abnormal; Notable for the following components:   CO2 21 (*)    All other components  within normal limits  POC URINE PREG, ED    EKG: None  Radiology: CT Soft Tissue Neck W Contrast Result Date: 07/02/2024 CLINICAL DATA:  Provided history: Foreign body suspected, neck, negative x-ray. Additional history provided: The patient reports having a fish bone stuck in her throat, difficulty swallowing. EXAM: CT NECK WITH CONTRAST TECHNIQUE: Multidetector CT imaging of the neck was performed using the standard protocol following the bolus administration of intravenous contrast. RADIATION DOSE REDUCTION: This exam was performed according to the departmental dose-optimization program which includes automated exposure control, adjustment of the mA and/or kV according to patient size and/or use of iterative reconstruction technique. CONTRAST:  60mL OMNIPAQUE  IOHEXOL  350 MG/ML SOLN COMPARISON:  Neck radiographs 07/02/2024 FINDINGS: Pharynx and larynx: No appreciable swelling or mass within the oral cavity, pharynx or larynx. No radiopaque foreign body is identified. Salivary glands: No inflammation, mass, or stone. Thyroid : 10 mm nodule within the left thyroid  lobe (series 6, image 66). Lymph nodes: No pathologically enlarged lymph nodes identified. Vascular: The major vascular structures of the neck are patent. Limited intracranial: No evidence of an acute intracranial abnormality within the field of view. Visualized orbits: No orbital mass or acute orbital finding. Mastoids and visualized paranasal sinuses: Small mucous retention cyst, and minimal background mucosal thickening, within the right maxillary sinus. Trace mucosal thickening within the left maxillary sinus. No significant mastoid effusion. Skeleton: Cervical spondylosis. Periapical lucency surrounding the posterior right maxillary molar tooth consistent with periodontal disease. Upper chest: No consolidation within the imaged lung apices. IMPRESSION: 1. No radiopaque foreign body is identified. 2. No appreciable swelling or mass within the  oral cavity, pharynx or larynx. 3. 10 mm left thyroid  lobe nodule. A non-emergent thyroid  ultrasound is recommended for further evaluation. Reference: J Am Coll Radiol. 2015 Feb;12(2): 143-50. 4. Mild paranasal sinus disease as described. Electronically Signed   By: Rockey Childs D.O.   On: 07/02/2024 14:12   DG Neck Soft Tissue Result Date: 07/02/2024 CLINICAL DATA:  Swallowed a fish bone. EXAM: NECK SOFT TISSUES - 1+ VIEW COMPARISON:  None Available. FINDINGS: No prevertebral soft tissue swelling. No evidence for linear or curvilinear unexpected radiopaque foreign body to suggest retained fishbone. IMPRESSION: No evidence for retained fishbone. Small fish bones could be occult on x-ray. If there is persistent clinical concern, CT imaging could be used to further evaluate. Electronically Signed   By: Camellia Candle M.D.   On: 07/02/2024 11:26     Procedures   Medications Ordered in the ED  alum & mag hydroxide-simeth (MAALOX/MYLANTA) 200-200-20 MG/5ML suspension 30 mL (30 mLs Oral Given 07/02/24 1121)  lidocaine  (XYLOCAINE ) 2 %  viscous mouth solution 15 mL (15 mLs Mouth/Throat Given 07/02/24 1121)  iohexol  (OMNIPAQUE ) 350 MG/ML injection 60 mL (60 mLs Intravenous Contrast Given 07/02/24 1307)  dexamethasone  (DECADRON ) injection 10 mg (10 mg Intravenous Given 07/02/24 1432)                                    Medical Decision Making Amount and/or Complexity of Data Reviewed Labs: ordered. Radiology: ordered.  Risk OTC drugs. Prescription drug management.   This patient presents to the ED for concern of foreign body, this involves an extensive number of treatment options, and is a complaint that carries with it a high risk of complications and morbidity.  The differential diagnosis includes food impaction, throat irritation, etc  33 year old female presents to the ED due to possible fishbone in throat.  Ate fish around 6:30 PM and felt a foreign body in her throat ever since.  Still able to  tolerate p.o.  No shortness of breath.  Upon arrival, stable vitals.  Patient in no acute distress.  Reassuring physical exam.  Lungs clear to auscultation bilaterally.  No stridor or wheeze.  No foreign body visualized in throat.  Routine labs ordered.  X-ray ordered in triage.  If x-ray is unremarkable will obtain CT scan. GI cocktail given. Discussed with Dr. Ruthe who agrees with assessment and plan.   X-ray personally reviewed and interpreted which negative for any foreign bodies.  CT scan ordered.  CBC with no leukocytosis.  BMP with normal renal function.  CT scan personally reviewed and interpreted which is negative for any foreign bodies.  Does not demonstrate any swelling or masses.  Incidental finding of a 10 mm left thyroid  lobe nodule.  Patient made aware of incidental finding.  Patient already has an ear nose and throat doctor.  Advised her to follow-up for thyroid  nodule and throat pain.  Suspect likely irritation from fishbone. Decadron  given. Patient able to tolerate p.o. at bedside without difficulty.  No evidence of impaction.  Patient stable for discharge. Strict ED precautions discussed with patient. Patient states understanding and agrees to plan. Patient discharged home in no acute distress and stable vitals.  Hx Crohns Has PCP    Final diagnoses:  Swallowed foreign body, initial encounter  Thyroid  nodule    ED Discharge Orders     None          Lorelle Aleck JAYSON DEVONNA 07/02/24 1445    Ruthe Cornet, DO 07/02/24 1501

## 2024-07-02 NOTE — Discharge Instructions (Addendum)
 It was a pleasure taking care of you today.  As discussed, your CT imaging did not show a fishbone.  Please follow-up with the ear nose and throat doctor.  Your CT scan did show you have a left thyroid  nodule.  Please follow-up with your ear nose and throat doctor for further evaluation.  Return to the ER for new or worsening symptoms.

## 2024-07-02 NOTE — ED Triage Notes (Signed)
 Pt states she has a fish bone stuck in her throat since last night. Pt c.o difficulty swallowing.

## 2024-07-02 NOTE — ED Notes (Signed)
 Patient dc by RN. Patient verbalizes understanding of instructions without additional questions. Ambulatory to lobby.

## 2024-07-11 DIAGNOSIS — E041 Nontoxic single thyroid nodule: Secondary | ICD-10-CM | POA: Insufficient documentation

## 2024-07-11 DIAGNOSIS — K219 Gastro-esophageal reflux disease without esophagitis: Secondary | ICD-10-CM | POA: Insufficient documentation

## 2024-07-12 ENCOUNTER — Encounter (HOSPITAL_COMMUNITY): Payer: Self-pay

## 2024-07-12 ENCOUNTER — Emergency Department (HOSPITAL_COMMUNITY)
Admission: EM | Admit: 2024-07-12 | Discharge: 2024-07-12 | Disposition: A | Discharge status: Home / Self Care | Payer: MEDICAID | Attending: Emergency Medicine | Admitting: Emergency Medicine

## 2024-07-12 ENCOUNTER — Emergency Department (EMERGENCY_DEPARTMENT_HOSPITAL): Payer: MEDICAID

## 2024-07-12 ENCOUNTER — Emergency Department (HOSPITAL_COMMUNITY): Payer: MEDICAID

## 2024-07-12 ENCOUNTER — Encounter: Payer: Self-pay | Admitting: Physician Assistant

## 2024-07-12 ENCOUNTER — Other Ambulatory Visit: Payer: Self-pay

## 2024-07-12 DIAGNOSIS — I8001 Phlebitis and thrombophlebitis of superficial vessels of right lower extremity: Secondary | ICD-10-CM

## 2024-07-12 DIAGNOSIS — R0789 Other chest pain: Secondary | ICD-10-CM | POA: Diagnosis not present

## 2024-07-12 DIAGNOSIS — M7989 Other specified soft tissue disorders: Secondary | ICD-10-CM | POA: Diagnosis not present

## 2024-07-12 DIAGNOSIS — M79651 Pain in right thigh: Secondary | ICD-10-CM | POA: Insufficient documentation

## 2024-07-12 DIAGNOSIS — R059 Cough, unspecified: Secondary | ICD-10-CM | POA: Diagnosis not present

## 2024-07-12 DIAGNOSIS — Z720 Tobacco use: Secondary | ICD-10-CM | POA: Diagnosis not present

## 2024-07-12 LAB — CBC WITH DIFFERENTIAL/PLATELET
Abs Immature Granulocytes: 0.02 K/uL (ref 0.00–0.07)
Basophils Absolute: 0.1 K/uL (ref 0.0–0.1)
Basophils Relative: 1 %
Eosinophils Absolute: 0.2 K/uL (ref 0.0–0.5)
Eosinophils Relative: 2 %
HCT: 40.3 % (ref 36.0–46.0)
Hemoglobin: 12 g/dL (ref 12.0–15.0)
Immature Granulocytes: 0 %
Lymphocytes Relative: 41 %
Lymphs Abs: 4 K/uL (ref 0.7–4.0)
MCH: 24.9 pg — ABNORMAL LOW (ref 26.0–34.0)
MCHC: 29.8 g/dL — ABNORMAL LOW (ref 30.0–36.0)
MCV: 83.8 fL (ref 80.0–100.0)
Monocytes Absolute: 0.5 K/uL (ref 0.1–1.0)
Monocytes Relative: 6 %
Neutro Abs: 5 K/uL (ref 1.7–7.7)
Neutrophils Relative %: 50 %
Platelets: 277 K/uL (ref 150–400)
RBC: 4.81 MIL/uL (ref 3.87–5.11)
Smear Review: NORMAL
WBC: 9.8 K/uL (ref 4.0–10.5)
nRBC: 0 % (ref 0.0–0.2)

## 2024-07-12 LAB — HCG, SERUM, QUALITATIVE: Preg, Serum: NEGATIVE

## 2024-07-12 LAB — BASIC METABOLIC PANEL WITH GFR
Anion gap: 12 (ref 5–15)
BUN: 15 mg/dL (ref 6–20)
CO2: 21 mmol/L — ABNORMAL LOW (ref 22–32)
Calcium: 9.8 mg/dL (ref 8.9–10.3)
Chloride: 105 mmol/L (ref 98–111)
Creatinine, Ser: 0.68 mg/dL (ref 0.44–1.00)
GFR, Estimated: >60 mL/min (ref >60)
Glucose, Bld: 84 mg/dL (ref 70–99)
Potassium: 3.7 mmol/L (ref 3.5–5.1)
Sodium: 138 mmol/L (ref 135–145)

## 2024-07-12 MED ORDER — APIXABAN (ELIQUIS) VTE STARTER PACK (10MG AND 5MG): ORAL_TABLET | ORAL | 0 refills | Status: DC

## 2024-07-12 MED ORDER — HYDROXYZINE HCL 25 MG PO TABS
25.0000 mg | ORAL_TABLET | Freq: Once | ORAL | Status: AC
  Administered 2024-07-12: 25 mg via ORAL
  Filled 2024-07-12: qty 1

## 2024-07-12 MED ORDER — IOHEXOL 350 MG/ML SOLN
75.0000 mL | Freq: Once | INTRAVENOUS | Status: AC | PRN
  Administered 2024-07-12: 75 mL via INTRAVENOUS

## 2024-07-12 MED ORDER — APIXABAN 2.5 MG PO TABS
10.0000 mg | ORAL_TABLET | Freq: Once | ORAL | Status: AC
  Administered 2024-07-12: 10 mg via ORAL
  Filled 2024-07-12: qty 4

## 2024-07-12 NOTE — ED Notes (Signed)
 Pt discharge delayed due to pt education on new medication. Conversation between her PCP and pharmacist and pt.

## 2024-07-12 NOTE — ED Notes (Signed)
 Resting, NAD, calm, interactive. Mentions unchanged chest and leg discomfort. Denies other sx. VSS. CTA results noted. Pending US  doppler LE.

## 2024-07-12 NOTE — ED Provider Notes (Signed)
 Signed out that ct and vascular u/s pending.  Ct neg for PE.   Vascular u/s neg for dvt but pt does have GSV STP extending to mid thigh. Pt with mild swelling/tenderness to area.  No erythema, increased warmth or cellulitis to area. No abscess. No chest pain or sob.   Vascular surgery consulted - discussed pt, prior varicose vein injection therapy to same leg/lower leg, vascular imaging study results, etc. with Dr Serene who reviewed vascular u/s.  He indicates rec tx as dvt, elqiuis, and pt can f/u in their dvt clinic for follow up.  Eliquis  dose in ed, rx for home.   Pt appears comfortable and stable for ed d/c.      Steffany Schoenfelder, MD 07/12/24 1450

## 2024-07-12 NOTE — Progress Notes (Signed)
 Right lower extremity venous duplex has been completed.  Results can be found in chart review under CV Proc.  07/12/2024 10:42 AM  Atul Delucia Elden Appl, RVT.

## 2024-07-12 NOTE — ED Triage Notes (Signed)
 Pt concerned for blood clot in right thigh, pt states she had surgery on her vein in her leg 1-2 weeks ago. States she has a red streak up her thigh. C/o dry cough. No blood thinners.

## 2024-07-12 NOTE — ED Provider Notes (Signed)
 Erika Stephens Provider Note   [name]: [id number] Arrival date & time: 07/12/24  9741     Patient presents with: Leg Swelling   Erika Stephens is a 33 y.o. female.   The history is provided by the patient.  Erika Stephens is a 33 y.o. female who presents to the Emergency Department complaining of thigh pain. She presents the emergency department for evaluation of right thigh pain that started several days ago. Pain is located to the right inner thigh. Feels itchy, sore. Does have increased pain with movement. She has noticed some redness in that area. She also reports mild cough and a funny feeling in the left side of her chest that has been ongoing for the last several weeks. No shortness of breath. No fever, nausea, vomiting, abdominal pain. She did recently have sclerotherapy to her veins in her right thigh. No history of DVT/PE. She does use tobacco, occasional alcohol. Occasional cocaine, by sniffing. No family history of DVT/PE. She also has a history of Crohn's disease and is currently undergoing treatment for this.     Prior to Admission medications   Medication Sig Start Date End Date Taking? Authorizing Provider  Cobalamin Combinations (B-12) [phone] MCG SUBL Place 1 Dose under the tongue daily at 12 noon. Patient not taking: Reported on 06/15/2024    [provider]  FeFum-FePoly-FA-B Cmp-C-Biot (INTEGRA PLUS ) CAPS Take 1 capsule by mouth every morning. 06/12/24   Heilingoetter, Cassandra L, PA-C  inFLIXimab  (REMICADE  IV) Inject into the vein. Every 8 weeks    [provider]  Prenatal Vit-Fe Fumarate-FA (PRENATAL MULTIVITAMIN) TABS tablet Take 1 tablet by mouth daily at 12 noon. Patient not taking: Reported on 06/15/2024 04/23/24   Prentis Kitchens A, DO  SUMAtriptan  (IMITREX ) 25 MG tablet Take 25 mg (1 tablet total) by mouth at the start of the headache. May repeat in 2 hours x 1 if headache persists. Max of 2 tablets/24  hours. Patient not taking: Reported on 06/15/2024 04/23/24   Prentis Kitchens A, DO    Allergies: Adalimumab    Review of Systems  All other systems reviewed and are negative.   Updated Vital Signs BP (!) 132/95   Pulse 76   Temp 98 F (36.7 C) (Oral)   Resp 15   Ht 5' 2 (1.575 m)   Wt 63.5 kg   LMP 06/08/2024 (Approximate) Comment: Patient had abortion in June 2025 and negative pregnancy test on 06/12/24  SpO2 99%   BMI 25.61 kg/m   Physical Exam Vitals and nursing note reviewed.  Constitutional:      Appearance: She is well-developed.  HENT:     Head: Normocephalic and atraumatic.  Cardiovascular:     Rate and Rhythm: Normal rate and regular rhythm.     Heart sounds: No murmur heard. Pulmonary:     Effort: Pulmonary effort is normal. No respiratory distress.     Breath sounds: Normal breath sounds.  Abdominal:     Palpations: Abdomen is soft.     Tenderness: There is no abdominal tenderness. There is no guarding or rebound.  Musculoskeletal:     Comments: There is mild tenderness and erythema over the right upper inner thigh. No significant induration. Range of motion of the hip, knee are intact.  Skin:    General: Skin is warm and dry.  Neurological:     Mental Status: She is alert and oriented to person, place, and time.  Psychiatric:  Behavior: Behavior normal.     (all labs ordered are listed, but only abnormal results are displayed) Labs Reviewed - No data to display  EKG: None  Radiology: No results found.   Procedures   Medications Ordered in the ED - No data to display                                  Medical Decision Making Amount and/or Complexity of Data Reviewed Labs: ordered. Radiology: ordered.  Risk Prescription drug management.   Patient here for evaluation of right thigh discomfort. She did recently have varicose vein treatment to the right thigh. She also complains of left sided chest discomfort. Examination with minimal  erythema to the thigh, no evidence of deep tissue space infection. Plan to obtain vascular ultrasound to rule out DVT. Given her respiratory symptoms will obtain the CTA to evaluate for PE. Patient care transferred pending additional imaging.     Final diagnoses:  None    ED Discharge Orders     None          Griselda Norris, MD 07/12/24 [phone]

## 2024-07-12 NOTE — Discharge Instructions (Addendum)
 It was our pleasure to provide your ER care today - we hope that you feel better.  Your imaging was read as showing 'no dvt' but note was made of clot in the greater saphenous vein extending up to mid thigh.   We discussed your case with our vascular specialists who recommend that you take blood thinner therapy (eliquis ) as prescribed, and follow up with them in the office/DVT clinic in the next couple weeks - call office today or tomorrow AM to arrange appointment time.   You may apply warm compress to sore area for symptom relief. You may take acetaminophen  as need.   Return to ER If worse, new symptoms, fevers, chest pain, trouble breathing, severe swelling, or other concern.   Information on my medicine - ELIQUIS  (apixaban )   Why was Eliquis  prescribed for you? Eliquis  was prescribed to treat blood clots that may have been found in the veins of your legs (deep vein thrombosis) or in your lungs (pulmonary embolism) and to reduce the risk of them occurring again.  What do You need to know about Eliquis  ? The starting dose is 10 mg (two 5 mg tablets) taken TWICE daily for the FIRST SEVEN (7) DAYS, then on 07/19/24  the dose is reduced to ONE 5 mg tablet taken TWICE daily.  Eliquis  may be taken with or without food.   Try to take the dose about the same time in the morning and in the evening. If you have difficulty swallowing the tablet whole please discuss with your pharmacist how to take the medication safely.  Take Eliquis  exactly as prescribed and DO NOT stop taking Eliquis  without talking to the doctor who prescribed the medication.  Stopping may increase your risk of developing a new blood clot.  Refill your prescription before you run out.  After discharge, you should have regular check-up appointments with your healthcare provider that is prescribing your Eliquis .    What do you do if you miss a dose? If a dose of ELIQUIS  is not taken at the scheduled time, take it as soon as  possible on the same day and twice-daily administration should be resumed. The dose should not be doubled to make up for a missed dose.  Important Safety Information A possible side effect of Eliquis  is bleeding. You should call your healthcare provider right away if you experience any of the following: Bleeding from an injury or your nose that does not stop. Unusual colored urine (red or dark brown) or unusual colored stools (red or black). Unusual bruising for unknown reasons. A serious fall or if you hit your head (even if there is no bleeding).  Some medicines may interact with Eliquis  and might increase your risk of bleeding or clotting while on Eliquis . To help avoid this, consult your healthcare provider or pharmacist prior to using any new prescription or non-prescription medications, including herbals, vitamins, non-steroidal anti-inflammatory drugs (NSAIDs) and supplements.  This website has more information on Eliquis  (apixaban ): [email or web address] .[email or web address] dena

## 2024-07-13 ENCOUNTER — Other Ambulatory Visit: Payer: Self-pay | Admitting: Otolaryngology

## 2024-07-13 DIAGNOSIS — E041 Nontoxic single thyroid nodule: Secondary | ICD-10-CM

## 2024-07-24 ENCOUNTER — Encounter: Payer: Self-pay | Admitting: Physician Assistant

## 2024-07-24 NOTE — Progress Notes (Signed)
Orders placed in this encounter.

## 2024-08-01 ENCOUNTER — Encounter: Payer: Self-pay | Admitting: Physician Assistant

## 2024-08-07 ENCOUNTER — Telehealth (HOSPITAL_COMMUNITY): Payer: Self-pay

## 2024-08-07 NOTE — Telephone Encounter (Signed)
 Auth Submission: NO AUTH NEEDED Site of care: Site of care: [facility] Payer: [employer] Medication & CPT/J Code(s) submitted: Remicade  (Infliximab ) J1745 Diagnosis Code: K50.818 Route of submission (phone, fax, portal):  Phone # Fax # Auth type: Buy/Bill HB Units/visits requested: 10mg /kg q8weeks Reference number:  Approval from: 08/07/24 to 11/08/24

## 2024-08-09 ENCOUNTER — Ambulatory Visit
Admission: RE | Admit: 2024-08-09 | Discharge: 2024-08-09 | Disposition: A | Discharge status: Home / Self Care | Payer: MEDICAID | Source: Ambulatory Visit | Attending: Otolaryngology | Admitting: Otolaryngology

## 2024-08-09 DIAGNOSIS — E041 Nontoxic single thyroid nodule: Secondary | ICD-10-CM

## 2024-08-11 ENCOUNTER — Ambulatory Visit (HOSPITAL_COMMUNITY)
Admission: RE | Admit: 2024-08-11 | Discharge: 2024-08-11 | Disposition: A | Discharge status: Home / Self Care | Payer: MEDICAID | Source: Ambulatory Visit | Attending: Internal Medicine | Admitting: Internal Medicine

## 2024-08-11 ENCOUNTER — Non-Acute Institutional Stay (HOSPITAL_COMMUNITY): Admission: RE | Admit: 2024-08-11 | Payer: MEDICAID | Source: Ambulatory Visit

## 2024-08-11 ENCOUNTER — Ambulatory Visit: Admitting: Gastroenterology

## 2024-08-11 VITALS — BP 102/58 | HR 76 | Temp 97.7°F | Resp 16 | Wt 142.6 lb

## 2024-08-11 DIAGNOSIS — K50818 Crohn's disease of both small and large intestine with other complication: Secondary | ICD-10-CM | POA: Diagnosis present

## 2024-08-11 MED ORDER — ACETAMINOPHEN 325 MG PO TABS
650.0000 mg | ORAL_TABLET | Freq: Once | ORAL | Status: AC
  Administered 2024-08-11: 650 mg via ORAL
  Filled 2024-08-11: qty 2

## 2024-08-11 MED ORDER — SODIUM CHLORIDE 0.9 % IV SOLN: INTRAVENOUS | Status: DC | PRN

## 2024-08-11 MED ORDER — DIPHENHYDRAMINE HCL 25 MG PO CAPS
25.0000 mg | ORAL_CAPSULE | Freq: Once | ORAL | Status: AC
  Administered 2024-08-11: 25 mg via ORAL
  Filled 2024-08-11: qty 1

## 2024-08-11 MED ORDER — SODIUM CHLORIDE 0.9 % IV SOLN
700.0000 mg | Freq: Once | INTRAVENOUS | Status: AC
  Administered 2024-08-11: 700 mg via INTRAVENOUS
  Filled 2024-08-11: qty 70

## 2024-08-11 NOTE — Progress Notes (Signed)
 PATIENT CARE CENTER NOTE   Diagnosis: Crohn's disease of both small and large intestine with other complication [facility])     Provider: Aloha Finner MD     Procedure: Remicade  10mg /kg infusion     Note: Patient received 700mg  Remicade  infusion  (dose #1 of 3) via PIV. Pt premedicated with Tylenol  and Benadryl  PO. No labs required per therapy plan.   Infusion titrated per protocol. Patient tolerated well with no adverse reaction. Vital signs stable. Pt declined AVS but provided with excuse from work note. Patient to come back every 8 weeks for infusion, instructed to schedule next infusion at front desk prior to leaving, verbalized understanding. Per pharmacy, if dose adjustment is required with subsequent infusions due to pt weight loss, provider will be notified for dose change prior to administration of medication. Alert, oriented and ambulatory at discharge.

## 2024-08-15 NOTE — Progress Notes (Deleted)
 [facility] OFFICE PROGRESS NOTE  Tanda Bleacher, MD [address] [place] [address]  DIAGNOSIS:  1) Iron deficiency anemia secondary to gastrointestinal as well as gynecological blood loss 2) vitamin B12 deficiency secondary to chronic disease, Crohn's disease and ileum resection  PRIOR THERAPY: Discontinued iron supplements due to intolerance   CURRENT THERAPY: Feraheme  infusion on as-needed basis.  Last dose was giving in 06/19/24  INTERVAL HISTORY: Erika Stephens 33 y.o. female returns fto the clinic today for a follow up visit. The patient is followed for iron deficiency anemia. She was last seen on 06/12/24. Her last iron infusion was on 06/19/24 with feraheme .   The patient has Crohn's disease and ilium resection. She receives Remicade . ***I sent her integra plus .   She was hospitalized recently for leg swelling. Her workup was negative for DVT and PE. CP. Occassional cocaine use.   She overall has fatigue. She denies visible bleeding besides her regular menstrual cycles which are not heavy.  She reports shortness of breath with exertion/decreased exercise. She is here for evaluation and repeat blood work.    MEDICAL HISTORY: Past Medical History:  Diagnosis Date   Anemia    Anxiety    Blood transfusion without reported diagnosis    Central centrifugal scarring alopecia    Chronic swimmer's ear of right side    Crohn's colitis (HCC)    IDA (iron deficiency anemia)    Impetigo    Postinflammatory hyperpigmentation    Pre-eclampsia in postpartum period 01/14/2019   Scalp psoriasis    Seborrheic dermatitis of scalp     ALLERGIES:  is allergic to adalimumab.  MEDICATIONS:  Current Outpatient Medications  Medication Sig Dispense Refill   APIXABAN  (ELIQUIS ) VTE STARTER PACK (10MG  AND 5MG ) Take as directed on package: start with two-5mg  tablets twice daily for 7 days. On day 8, switch to one-5mg  tablet twice daily. 74 each 0   Cobalamin  Combinations (B-12) [phone] MCG SUBL Place 1 Dose under the tongue daily at 12 noon. (Patient not taking: Reported on 06/15/2024)     FeFum-FePoly-FA-B Cmp-C-Biot (INTEGRA PLUS ) CAPS Take 1 capsule by mouth every morning. 30 capsule 2   inFLIXimab  (REMICADE  IV) Inject into the vein. Every 8 weeks     Prenatal Vit-Fe Fumarate-FA (PRENATAL MULTIVITAMIN) TABS tablet Take 1 tablet by mouth daily at 12 noon. (Patient not taking: Reported on 06/15/2024) 30 tablet 0   SUMAtriptan  (IMITREX ) 25 MG tablet Take 25 mg (1 tablet total) by mouth at the start of the headache. May repeat in 2 hours x 1 if headache persists. Max of 2 tablets/24 hours. (Patient not taking: Reported on 06/15/2024) 10 tablet 0   No current facility-administered medications for this visit.    SURGICAL HISTORY:  Past Surgical History:  Procedure Laterality Date   APPENDECTOMY     BOWEL RESECTION     COLONOSCOPY     UPPER GASTROINTESTINAL ENDOSCOPY      REVIEW OF SYSTEMS:   Review of Systems  Constitutional: Negative for appetite change, chills, fatigue, fever and unexpected weight change.  HENT:   Negative for mouth sores, nosebleeds, sore throat and trouble swallowing.   Eyes: Negative for eye problems and icterus.  Respiratory: Negative for cough, hemoptysis, shortness of breath and wheezing.   Cardiovascular: Negative for chest pain and leg swelling.  Gastrointestinal: Negative for abdominal pain, constipation, diarrhea, nausea and vomiting.  Genitourinary: Negative for bladder incontinence, difficulty urinating, dysuria, frequency and hematuria.   Musculoskeletal: Negative  for back pain, gait problem, neck pain and neck stiffness.  Skin: Negative for itching and rash.  Neurological: Negative for dizziness, extremity weakness, gait problem, headaches, light-headedness and seizures.  Hematological: Negative for adenopathy. Does not bruise/bleed easily.  Psychiatric/Behavioral: Negative for confusion, depression and sleep  disturbance. The patient is not nervous/anxious.     PHYSICAL EXAMINATION:  unknown if currently breastfeeding.  ECOG PERFORMANCE STATUS: {CHL ONC ECOG [id number]  Physical Exam  Constitutional: Oriented to person, place, and time and well-developed, well-nourished, and in no distress. No distress.  HENT:  Head: Normocephalic and atraumatic.  Mouth/Throat: Oropharynx is clear and moist. No oropharyngeal exudate.  Eyes: Conjunctivae are normal. Right eye exhibits no discharge. Left eye exhibits no discharge. No scleral icterus.  Neck: Normal range of motion. Neck supple.  Cardiovascular: Normal rate, regular rhythm, normal heart sounds and intact distal pulses.   Pulmonary/Chest: Effort normal and breath sounds normal. No respiratory distress. No wheezes. No rales.  Abdominal: Soft. Bowel sounds are normal. Exhibits no distension and no mass. There is no tenderness.  Musculoskeletal: Normal range of motion. Exhibits no edema.  Lymphadenopathy:    No cervical adenopathy.  Neurological: Alert and oriented to person, place, and time. Exhibits normal muscle tone. Gait normal. Coordination normal.  Skin: Skin is warm and dry. No rash noted. Not diaphoretic. No erythema. No pallor.  Psychiatric: Mood, memory and judgment normal.  Vitals reviewed.  LABORATORY DATA: Lab Results  Component Value Date   WBC 9.8 07/12/2024   HGB 12.0 07/12/2024   HCT 40.3 07/12/2024   MCV 83.8 07/12/2024   PLT 277 07/12/2024      Chemistry      Component Value Date/Time   NA 138 07/12/2024 0608   NA 138 09/02/2023 1600   K 3.7 07/12/2024 0608   CL 105 07/12/2024 0608   CO2 21 (L) 07/12/2024 0608   BUN 15 07/12/2024 0608   BUN 15 09/02/2023 1600   CREATININE 0.68 07/12/2024 0608   CREATININE 0.84 11/14/2020 1239      Component Value Date/Time   CALCIUM 9.8 07/12/2024 0608   ALKPHOS 63 06/16/2024 0943   AST 38 06/16/2024 0943   AST 21 11/14/2020 1239   ALT 15 06/16/2024 0943   ALT 15  11/14/2020 1239   BILITOT 0.7 06/16/2024 0943   BILITOT 0.3 09/02/2023 1600   BILITOT 0.4 11/14/2020 1239       RADIOGRAPHIC STUDIES:  US  THYROID  Result Date: 08/10/2024 CLINICAL DATA:  Incidental on CT. Left thyroid  nodule detected by CT of the neck. EXAM: THYROID  ULTRASOUND TECHNIQUE: Ultrasound examination of the thyroid  gland and adjacent soft tissues was performed. COMPARISON:  CT neck 07/12/2024- FINDINGS: Parenchymal Echotexture: Normal Isthmus: 0.4 cm Right lobe: 4.5 x 1.4 x 2.1 cm Left lobe: 4.4 x 1.3 x 1.9 cm _________________________________________________________ Estimated total number of nodules >/= 1 cm: 1 Number of spongiform nodules >/=  2 cm not described below (TR1): 0 Number of mixed cystic and solid nodules >/= 1.5 cm not described below (TR2): 0 _________________________________________________________ Nodule # 1: Location: Left; Superior Maximum size: 1.2 cm; Other 2 dimensions: 0.8 x 0.6 cm Composition: cystic/almost completely cystic (0) Echogenicity: anechoic (0) Shape: not taller-than-wide (0) Margins: smooth (0) Echogenic foci: none (0) ACR TI-RADS total points: 0. ACR TI-RADS risk category: TR1 (0-1 points). ACR TI-RADS recommendations: This nodule does NOT meet TI-RADS criteria for biopsy or dedicated follow-up. _________________________________________________________ No enlarged or abnormal appearing lymph nodes are identified. IMPRESSION: 1.2 cm left thyroid  cyst  does not meet criteria for biopsy or follow-up. The above is in keeping with the ACR TI-RADS recommendations - J Am Coll Radiol 2017;14:587-595. Electronically Signed   By: Marcey Moan M.D.   On: 08/10/2024 08:38     ASSESSMENT/PLAN:  This is a very pleasant 33 years old African-American female with history of iron deficiency anemia secondary to menorrhagia as well as gastrointestinal blood loss.    The patient also has history of Crohn's disease and currently on treatment with Remicade . She has a  history of vitamin B12 deficiency.    She receives IV iron infusion with fereheme, most recent being on 06/19/24. She cannot tolerate iron supplements.    The patient had repeat labs today with CBC, iron studies, and ferritin. Her labs today show ***.     We will see her back for a follow up visit in 6 weeks for evaluation and repeat blood work.    She is going to start taking a multivitamin. I will send her integra plus  to take 1 tablet p.o. daily  The patient was advised to call immediately if she has any concerning symptoms in the interval. The patient voices understanding of current disease status and treatment options and is in agreement with the current care plan. All questions were answered. The patient knows to call the clinic with any problems, questions or concerns. We can certainly see the patient much sooner if necessary  No orders of the defined types were placed in this encounter.    I spent {CHL ONC TIME VISIT - DTPQU:[id number]} counseling the patient face to face. The total time spent in the appointment was {CHL ONC TIME VISIT - DTPQU:[id number]}.  Yared Barefoot L Kip Cropp, PA-C 08/15/24

## 2024-08-23 ENCOUNTER — Ambulatory Visit: Payer: MEDICAID | Admitting: Physician Assistant

## 2024-08-23 ENCOUNTER — Inpatient Hospital Stay: Payer: MEDICAID | Attending: Physician Assistant

## 2024-08-30 ENCOUNTER — Ambulatory Visit
Admission: EM | Admit: 2024-08-30 | Discharge: 2024-08-30 | Disposition: A | Discharge status: Home / Self Care | Payer: MEDICAID | Attending: Family Medicine | Admitting: Family Medicine

## 2024-08-30 ENCOUNTER — Encounter: Payer: Self-pay | Admitting: Emergency Medicine

## 2024-08-30 DIAGNOSIS — Z202 Contact with and (suspected) exposure to infections with a predominantly sexual mode of transmission: Secondary | ICD-10-CM | POA: Insufficient documentation

## 2024-08-30 DIAGNOSIS — N926 Irregular menstruation, unspecified: Secondary | ICD-10-CM | POA: Insufficient documentation

## 2024-08-30 LAB — POCT URINE PREGNANCY: Preg Test, Ur: NEGATIVE

## 2024-08-30 LAB — POCT URINE DIPSTICK
Bilirubin, UA: NEGATIVE
Glucose, UA: NEGATIVE mg/dL
Ketones, POC UA: NEGATIVE mg/dL
Leukocytes, UA: NEGATIVE
Nitrite, UA: NEGATIVE
POC PROTEIN,UA: 30 — AB
Spec Grav, UA: >=1.03 — AB (ref 1.010–1.025)
Urobilinogen, UA: 0.2 U/dL
pH, UA: 6 (ref 5.0–8.0)

## 2024-08-30 MED ORDER — ONDANSETRON 4 MG PO TBDP: 4.0000 mg | ORAL_TABLET | Freq: Three times a day (TID) | ORAL | 0 refills | Status: AC | PRN

## 2024-08-30 MED ORDER — METRONIDAZOLE 500 MG PO TABS: 500.0000 mg | ORAL_TABLET | Freq: Two times a day (BID) | ORAL | 0 refills | Status: AC

## 2024-08-30 NOTE — Discharge Instructions (Signed)
 The pregnancy test was negative  Urinalysis had red blood cells but no other signs of what might be a urinary infection.  There were no white blood cells or nitrites.  I do wonder if the blood is possibly your menstrual cycle trying to start.  Staff will notify you if there is anything positive on the swab or on the blood work. It can take 2-3 days for the tests to result, depending on the day of the week your test was taken. You will only be notified if there are any positives on the testing; test results will also go to your MyChart if you are signed up for MyChart.   Ondansetron  dissolved in the mouth every 8 hours as needed for nausea or vomiting. Clear liquids(water, gatorade/pedialyte, ginger ale/sprite, chicken broth/soup) and bland things(crackers/toast, rice, potato, bananas) to eat. Avoid acidic foods like lemon/lime/orange/tomato, and avoid greasy/spicy foods.  Take metronidazole  500 mg--1 tablet 2 times daily for 7 days.  Avoid drinking alcohol within 72 hours of taking this medication.  This is for possible bacterial vaginosis.

## 2024-08-30 NOTE — ED Provider Notes (Addendum)
 [name]    [name]: [id number] Arrival date & time: 08/30/24  1752      History   Chief Complaint Chief Complaint  Patient presents with   Possible Pregnancy   Exposure to STD    HPI Erika Stephens is a 33 y.o. female.    Possible Pregnancy  Exposure to STD  Here for vaginal bleeding or hematuria x 1 week: She only notes this bleeding when she has urinated. No dysuria, but maybe some urinary frequency.  No f/c. She has been nauseated, and threw up once on 10/18.  She is having some abdominal cramping.  Also having some fishy odor with gray vaginal dc.  She is allergic to adalimumab.  LMP: Told staff at triage it began 9/27, but to me states it might have been the week before.   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   Polysubstance abuse (HCC) 04/20/2024   Bipolar disorder current episode depressed (HCC) 04/20/2024   Anemia 04/18/2024   Right ear pain 12/27/2023   Arthralgia of right temporomandibular joint 12/27/2023   Allergic rhinitis 12/27/2023   Cervical spondylosis 12/07/2023   Cervical radiculopathy 10/18/2023   Spinal stenosis in cervical region 09/28/2023   Neck pain 06/10/2023   Arthralgia of right knee 04/28/2023   Pain of right lower leg 04/20/2023   Pain in joint of left shoulder 04/20/2023   Dysuria 01/11/2023   Anorexia 07/03/2022   Nausea 07/03/2022   Unintentional weight loss  07/03/2022   Anxiety disorder 04/03/2021   Bipolar disorder (HCC) 04/03/2021   Hemorrhoids 12/26/2020   Chest pain of uncertain etiology 12/25/2020   Essential hypertension 12/25/2020   History of pre-eclampsia 12/25/2020   ASCUS of cervix with negative high risk HPV 11/27/2020   Iron deficiency anemia 11/14/2020   Fecal urgency 10/16/2020   Nasal sore 10/16/2020   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   Crohn's disease of both small and large intestine with other complication (HCC) 03/08/2020   History of iron deficiency anemia 03/08/2020   Infliximab  (Remicade ) long-term use 03/08/2020   History of immunosuppression therapy 03/08/2020   Generalized abdominal pain 03/08/2020   Bloating symptom 03/08/2020   Chronic diarrhea 03/08/2020   Pre-eclampsia in postpartum period 01/14/2019   NSVD (normal spontaneous vaginal delivery) 12/28/2018   Cyst of ovary 12/21/2016   Crohn's disease (HCC) 11/27/2015   Anal pain 09/11/2013   Skin rash 09/11/2013   Immunosuppression 06/07/2013    Past Surgical History:  Procedure Laterality Date   APPENDECTOMY     BOWEL RESECTION     COLONOSCOPY     UPPER GASTROINTESTINAL ENDOSCOPY      OB History     Gravida  1   Para      Term      Preterm      AB      Living  SAB      IAB      Ectopic      Multiple      Live Births               Home Medications    Prior to Admission medications   Medication Sig Start Date End Date Taking? Authorizing Provider  inFLIXimab  (REMICADE  IV) Inject into the vein. Every 8 weeks   Yes [provider]  metroNIDAZOLE  (FLAGYL ) 500 MG tablet Take 1 tablet (500 mg total) by mouth 2 (two) times daily for 7 days. 08/30/24 09/06/24 Yes Vonna Sharlet POUR, MD  ondansetron  (ZOFRAN -ODT) 4 MG disintegrating tablet Take 1 tablet (4 mg total) by mouth every 8 (eight) hours as needed for nausea or vomiting. 08/30/24  Yes Vonna Sharlet POUR, MD  APIXABAN  (ELIQUIS ) VTE STARTER  PACK (10MG  AND 5MG ) Take as directed on package: start with two-5mg  tablets twice daily for 7 days. On day 8, switch to one-5mg  tablet twice daily. Patient not taking: Reported on 08/30/2024 07/12/24   Steinl, Kevin, MD  Cobalamin Combinations (B-12) [phone] MCG SUBL Place 1 Dose under the tongue daily at 12 noon. Patient not taking: Reported on 06/15/2024    [provider]  etonogestrel -ethinyl estradiol  (NUVARING) 0.12-0.015 MG/24HR vaginal ring Insert vaginally and leave in place for 3 consecutive weeks, then remove for 1 week. Patient not taking: Reported on 08/30/2024 11/24/23   [provider]  FeFum-FePoly-FA-B Cmp-C-Biot (INTEGRA PLUS ) CAPS Take 1 capsule by mouth every morning. 06/12/24   Heilingoetter, Cassandra L, PA-C  Multiple Vitamin (MULTI-VITAMIN) tablet Take by mouth. Patient not taking: Reported on 08/30/2024    [provider]  Prenatal Vit-Fe Fumarate-FA (PRENATAL MULTIVITAMIN) TABS tablet Take 1 tablet by mouth daily at 12 noon. Patient not taking: Reported on 06/15/2024 04/23/24   Prentis Kitchens A, DO  SUMAtriptan  (IMITREX ) 25 MG tablet Take 25 mg (1 tablet total) by mouth at the start of the headache. May repeat in 2 hours x 1 if headache persists. Max of 2 tablets/24 hours. Patient not taking: Reported on 06/15/2024 04/23/24   Prentis Kitchens LABOR, DO    Family History Family History  Problem Relation Age of Onset   Crohn's disease Mother    Schizophrenia Father    Bipolar disorder Father    Crohn's disease Maternal Grandmother    Prostate cancer Maternal Uncle    Colon cancer Neg Hx    Stomach cancer Neg Hx    Pancreatic cancer Neg Hx    Esophageal cancer Neg Hx    Inflammatory bowel disease Neg Hx    Liver disease Neg Hx    Rectal cancer Neg Hx    Breast cancer Neg Hx     Social History Social History   Tobacco Use   Smoking status: Some Days    Current packs/day: 0.25    Types: Cigarettes    Passive exposure: Never   Smokeless tobacco:  Never  Vaping Use   Vaping status: Never Used  Substance Use Topics   Alcohol use: Yes    Comment: socially.    Drug use: Yes    Types: Marijuana, Cocaine    Comment: last use of cocaine: 06/14/24     Allergies   Adalimumab   Review of Systems Review of Systems   Physical Exam Triage Vital Signs ED Triage Vitals [08/30/24 1849]  Encounter Vitals Group     BP (!) 130/91     Girls Systolic BP Percentile  Girls Diastolic BP Percentile      Boys Systolic BP Percentile      Boys Diastolic BP Percentile      Pulse Rate 73     Resp 14     Temp 98 F (36.7 C)     Temp Source Oral     SpO2 97 %     Weight      Height      Head Circumference      Peak Flow      Pain Score 3     Pain Loc      Pain Education      Exclude from Growth Chart    No data found.  Updated Vital Signs BP (!) 130/91 (BP Location: Right Arm)   Pulse 73   Temp 98 F (36.7 C) (Oral)   Resp 14   LMP 08/05/2024 (Approximate)   SpO2 97%   Breastfeeding No   Visual Acuity Right Eye Distance:   Left Eye Distance:   Bilateral Distance:    Right Eye Near:   Left Eye Near:    Bilateral Near:     Physical Exam Vitals reviewed.  Constitutional:      General: She is not in acute distress.    Appearance: She is not ill-appearing, toxic-appearing or diaphoretic.  HENT:     Mouth/Throat:     Mouth: Mucous membranes are moist.  Eyes:     Extraocular Movements: Extraocular movements intact.     Conjunctiva/sclera: Conjunctivae normal.     Pupils: Pupils are equal, round, and reactive to light.  Cardiovascular:     Rate and Rhythm: Normal rate and regular rhythm.     Heart sounds: No murmur heard. Pulmonary:     Effort: Pulmonary effort is normal.     Breath sounds: Normal breath sounds.  Abdominal:     Palpations: Abdomen is soft.     Tenderness: There is abdominal tenderness (suprapubic).  Musculoskeletal:     Cervical back: Neck supple.  Lymphadenopathy:     Cervical: No cervical  adenopathy.  Skin:    Coloration: Skin is not pale.  Neurological:     General: No focal deficit present.     Mental Status: She is alert and oriented to person, place, and time.  Psychiatric:        Behavior: Behavior normal.      UC Treatments / Results  Labs (all labs ordered are listed, but only abnormal results are displayed) Labs Reviewed  POCT URINE DIPSTICK - Abnormal; Notable for the following components:      Result Value   Clarity, UA hazy (*)    Spec Grav, UA >=1.030 (*)    Blood, UA large (*)    POC PROTEIN,UA =30 (*)    All other components within normal limits  POCT URINE PREGNANCY - Normal  HIV ANTIBODY (ROUTINE TESTING W REFLEX)  RPR  HCG, SERUM, QUALITATIVE  CERVICOVAGINAL ANCILLARY ONLY    EKG   Radiology No results found.  Procedures Procedures (including critical care time)  Medications Ordered in UC Medications - No data to display  Initial Impression / Assessment and Plan / UC Course  I have reviewed the triage vital signs and the nursing notes.  Pertinent labs & imaging results that were available during my care of the patient were reviewed by me and considered in my medical decision making (see chart for details).     UPT is negative Urinalysis has red blood cells but no  white blood cells or nitrites.  Blood is drawn for HIV and RPR, and staff will notify them if any of that is positive; she requested to do STD testing.  Vaginal self swab is done, and we will notify of any positives on that and treat per protocol.  Zofran  is sent in for the nausea and Flagyl  sent in to treat empirically for possible BV.  I do wonder if this bleeding she has had is her menstrual cycle starting. We discussed, and decided to do a serum pregnancy test to to confirm that she is not pregnant at this time Final Clinical Impressions(s) / UC Diagnoses   Final diagnoses:  Potential exposure to STD  Irregular menses     Discharge Instructions       The pregnancy test was negative  Urinalysis had red blood cells but no other signs of what might be a urinary infection.  There were no white blood cells or nitrites.  I do wonder if the blood is possibly your menstrual cycle trying to start.  Staff will notify you if there is anything positive on the swab or on the blood work. It can take 2-3 days for the tests to result, depending on the day of the week your test was taken. You will only be notified if there are any positives on the testing; test results will also go to your MyChart if you are signed up for MyChart.   Ondansetron  dissolved in the mouth every 8 hours as needed for nausea or vomiting. Clear liquids(water, gatorade/pedialyte, ginger ale/sprite, chicken broth/soup) and bland things(crackers/toast, rice, potato, bananas) to eat. Avoid acidic foods like lemon/lime/orange/tomato, and avoid greasy/spicy foods.  Take metronidazole  500 mg--1 tablet 2 times daily for 7 days.  Avoid drinking alcohol within 72 hours of taking this medication.  This is for possible bacterial vaginosis.      ED Prescriptions     Medication Sig Dispense Auth. Provider   ondansetron  (ZOFRAN -ODT) 4 MG disintegrating tablet Take 1 tablet (4 mg total) by mouth every 8 (eight) hours as needed for nausea or vomiting. 10 tablet Vonna Sharlet POUR, MD   metroNIDAZOLE  (FLAGYL ) 500 MG tablet Take 1 tablet (500 mg total) by mouth 2 (two) times daily for 7 days. 14 tablet Laren Orama K, MD      PDMP not reviewed this encounter.   Vonna Sharlet POUR, MD 08/30/24 1911    Vonna Sharlet POUR, MD 08/30/24 [phone]

## 2024-08-30 NOTE — ED Triage Notes (Addendum)
 Pt reports either hematuria or abnormal vaginal bleeding x1 week. States it only happens when she urinates and it is small amounts. Also reports gray discharge with fishy odor x 1 week. Pt reports on top of these symptoms she took a pregnancy test on 10/20 that was positive due to an episode of morning nausea and headaches. LMP: 9/27-10/02 approximate. Pt reports cramping abdominal pain. Denies ongoing nausea and dysuria. Endorses urinary frequency. No known exposures to STIs. Wants general testing - cytology and blood work.

## 2024-08-31 LAB — CERVICOVAGINAL ANCILLARY ONLY
Bacterial Vaginitis (gardnerella): POSITIVE — AB
Candida Glabrata: NEGATIVE
Candida Vaginitis: NEGATIVE
Chlamydia: NEGATIVE
Comment: NEGATIVE
Comment: NEGATIVE
Comment: NEGATIVE
Comment: NEGATIVE
Comment: NEGATIVE
Comment: NORMAL
Neisseria Gonorrhea: NEGATIVE
Trichomonas: NEGATIVE

## 2024-08-31 LAB — RPR: RPR Ser Ql: NONREACTIVE

## 2024-08-31 LAB — HCG, SERUM, QUALITATIVE: hCG,Beta Subunit,Qual,Serum: NEGATIVE m[IU]/mL (ref <6)

## 2024-08-31 LAB — HIV ANTIBODY (ROUTINE TESTING W REFLEX): HIV Screen 4th Generation wRfx: NONREACTIVE

## 2024-09-01 ENCOUNTER — Ambulatory Visit (HOSPITAL_COMMUNITY): Payer: Self-pay

## 2024-09-06 ENCOUNTER — Ambulatory Visit: Payer: Self-pay | Admitting: *Deleted

## 2024-09-06 ENCOUNTER — Telehealth: Payer: Self-pay | Admitting: Physician Assistant

## 2024-09-06 ENCOUNTER — Ambulatory Visit: Admitting: Gastroenterology

## 2024-09-06 NOTE — Telephone Encounter (Signed)
 I left voicemail for patient regarding rescheduled appointment on 09/14/2024. A printed reminder has been mailed to address on file. I requested for patient to return my call if date/time does not work for her.

## 2024-09-06 NOTE — Telephone Encounter (Signed)
 No appts available for today with pcp. Can pt be worked in this afternoon with Dr. Tanda? or Provider Amy Lorren has DB opening tomorrow at 9:20am. Can pt be seen with different provider?  Please advise.

## 2024-09-06 NOTE — Telephone Encounter (Signed)
 FYI Only or Action Required?: Action required by provider: request for appointment and clinical question for provider. Recommended ED for evaluation due to hx DVT.  Patient was last seen in primary care on 11/30/2023 by Tanda Bleacher, MD.  Called Nurse Triage reporting Leg Pain.  Symptoms began yesterday.  Interventions attempted: Prescription medications: eliquis  starter pack.  Symptoms are: gradually worsening.  Triage Disposition: See HCP Within 4 Hours (Or PCP Triage)  Patient/caregiver understands and will follow disposition?: Unsure             Copied from CRM [id number]. Topic: Clinical - Red Word Triage >> Sep 06, 2024 11:08 AM Asselin T wrote: Red Word that prompted transfer to Nurse Triage: patient said she has blood in her urine. When a dip stick test was done it came back abnormal. Patient said she would like to change providers to [employer]. Reason for Disposition  [1] Thigh, calf, or ankle swelling AND [2] only 1 side  Answer Assessment - Initial Assessment Questions Recommended ED no available appt today . Hx DVT in the past and patient started on eliquis  stater pack , stopped taking medication and now c/o pain and swelling in right leg. Started eliquis  again and pain decreased. Denies chest pain no SOB. Patient concerned and wanted appt to f/u on recent UC visit and noted abnormal results for urine sample. Reports she wants PCP to f/u on ketones and blood in urine. No available appt until Dec. Patient requesting call back if she can be seen sooner due to abnormal urine sample. Please advise.  Unsure if patient will go to ED , CAL notified.      1. ONSET: When did the pain start?      Yesterday  and on and off since last week  2. LOCATION: Where is the pain located?      Right leg  3. PAIN: How bad is the pain?    (Scale 1-10; or mild, moderate, severe)     Dull pain right leg 4/10 after taking eliquis   4. WORK OR EXERCISE: Has there been any  recent work or exercise that involved this part of the body?      na 5. CAUSE: What do you think is causing the leg pain?     Possible blood clot hx of DVT in the past and has not been taking eliquis  as directed.  6. OTHER SYMPTOMS: Do you have any other symptoms? (e.g., chest pain, back pain, breathing difficulty, swelling, rash, fever, numbness, weakness)     Right leg pain , swelling back of knee and thigh.  7. PREGNANCY: Is there any chance you are pregnant? When was your last menstrual period?     na  Protocols used: Leg Pain-A-AH

## 2024-09-06 NOTE — Telephone Encounter (Signed)
 Valerie can you get this patient into day same where on the schedule. She was advised UC, but Patient wants some one there to call you back to see if she can be worked in. Thanks

## 2024-09-07 NOTE — Telephone Encounter (Signed)
No sooner appts available

## 2024-09-07 NOTE — Progress Notes (Signed)
 [facility] OFFICE PROGRESS NOTE  Tanda Bleacher, MD 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  Last dose was giving in 06/19/24  INTERVAL HISTORY: Erika Stephens 33 y.o. female returns to the clinic today for a follow up visit. The patient is followed for iron deficiency anemia. She was last seen on 06/12/24. Her last iron infusion was on 06/19/24 with feraheme .   The patient has Crohn's disease and ilium resection. She receives Remicade .   She is overall feeling fairly well.  She states that she is seeing a vascular provider for possible superficial clot.  He has an appointment for physical next week.  She has history of ileum resection and states that she is taking B12 supplements.  She is not taking any B12 injections.  She takes a multivitamin as well.  She denies any visible bleeding.  States her menstrual cycles are not heavy.  Her last menstrual cycle was the last week of October and she had 2 days that were heavy but it was otherwise light.   She is here for evaluation and repeat blood work.    MEDICAL HISTORY: Past Medical History:  Diagnosis Date   Anemia    Anxiety    Blood transfusion without reported diagnosis    Central centrifugal scarring alopecia    Chronic swimmer's ear of right side    Crohn's colitis (HCC)    IDA (iron deficiency anemia)    Impetigo    Postinflammatory hyperpigmentation    Pre-eclampsia in postpartum period 01/14/2019   Scalp psoriasis    Seborrheic dermatitis of scalp     ALLERGIES:  is allergic to adalimumab.  MEDICATIONS:  Current Outpatient Medications  Medication Sig Dispense Refill   APIXABAN   (ELIQUIS ) VTE STARTER PACK (10MG  AND 5MG ) Take as directed on package: start with two-5mg  tablets twice daily for 7 days. On day 8, switch to one-5mg  tablet twice daily. (Patient not taking: Reported on 08/30/2024) 74 each 0   Cobalamin Combinations (B-12) [phone] MCG SUBL Place 1 Dose under the tongue daily at 12 noon. (Patient not taking: Reported on 06/15/2024)     etonogestrel -ethinyl estradiol  (NUVARING) 0.12-0.015 MG/24HR vaginal ring Insert vaginally and leave in place for 3 consecutive weeks, then remove for 1 week. (Patient not taking: Reported on 08/30/2024)     FeFum-FePoly-FA-B Cmp-C-Biot (INTEGRA PLUS ) CAPS Take 1 capsule by mouth every morning. 30 capsule 2   inFLIXimab  (REMICADE  IV) Inject into the vein. Every 8 weeks     Multiple Vitamin (MULTI-VITAMIN) tablet Take by mouth. (Patient not taking: Reported on 08/30/2024)     ondansetron  (ZOFRAN -ODT) 4 MG disintegrating tablet Take 1 tablet (4 mg total) by mouth every 8 (eight) hours as needed for nausea or vomiting. 10 tablet 0   Prenatal Vit-Fe Fumarate-FA (PRENATAL MULTIVITAMIN) TABS tablet Take 1 tablet by mouth daily at 12 noon. (Patient not taking: Reported on 06/15/2024) 30 tablet 0   SUMAtriptan  (IMITREX ) 25 MG tablet Take 25 mg (1 tablet total) by mouth at the start of the headache. May repeat in 2 hours x 1 if headache persists. Max of 2 tablets/24 hours. (Patient not taking: Reported on 06/15/2024) 10 tablet 0   No current facility-administered medications for this visit.  SURGICAL HISTORY:  Past Surgical History:  Procedure Laterality Date   APPENDECTOMY     BOWEL RESECTION     COLONOSCOPY     UPPER GASTROINTESTINAL ENDOSCOPY      REVIEW OF SYSTEMS:   Review of Systems  Constitutional: Negative for appetite change, chills, fatigue, fever and unexpected weight change.  HENT: Negative for mouth sores, nosebleeds, sore throat and trouble swallowing.   Eyes: Negative for eye problems and icterus.  Respiratory: Negative  for cough, hemoptysis, shortness of breath and wheezing.   Cardiovascular: Negative for chest pain and leg swelling.  Gastrointestinal: Negative for abdominal pain, constipation, diarrhea, nausea and vomiting.  Genitourinary: Negative for bladder incontinence, difficulty urinating, dysuria, frequency and hematuria.   Musculoskeletal: Negative for back pain, gait problem, neck pain and neck stiffness.  Skin: Negative for itching and rash.  Neurological: Negative for dizziness, extremity weakness, gait problem, headaches, light-headedness and seizures.  Hematological: Negative for adenopathy. Does not bruise/bleed easily.  Psychiatric/Behavioral: Negative for confusion, depression and sleep disturbance. The patient is not nervous/anxious.     PHYSICAL EXAMINATION:  Blood pressure 119/84, pulse 72, temperature 98.6 F (37 C), temperature source Temporal, resp. rate 15, height 5' 2 (1.575 m), weight 148 lb 11.2 oz (67.4 kg), last menstrual period 08/05/2024, SpO2 100%.  ECOG PERFORMANCE STATUS: 1  Physical Exam  Constitutional: Oriented to person, place, and time and well-developed, well-nourished, and in no distress.  HENT:  Head: Normocephalic and atraumatic.  Mouth/Throat: Oropharynx is clear and moist. No oropharyngeal exudate.  Eyes: Conjunctivae are normal. Right eye exhibits no discharge. Left eye exhibits no discharge. No scleral icterus.  Neck: Normal range of motion. Neck supple.  Cardiovascular: Normal rate, regular rhythm, normal heart sounds and intact distal pulses.   Pulmonary/Chest: Effort normal and breath sounds normal. No respiratory distress. No wheezes. No rales.  Abdominal: Soft. Bowel sounds are normal. Exhibits no distension and no mass. There is no tenderness.  Musculoskeletal: Normal range of motion. Exhibits no edema.  Lymphadenopathy:    No cervical adenopathy.  Neurological: Alert and oriented to person, place, and time. Exhibits normal muscle tone. Gait  normal. Coordination normal.  Skin: Skin is warm and dry. No rash noted. Not diaphoretic. No erythema. No pallor.  Psychiatric: Mood, memory and judgment normal.  Vitals reviewed.  LABORATORY DATA: Lab Results  Component Value Date   WBC 8.0 09/14/2024   HGB 10.7 (L) 09/14/2024   HCT 32.5 (L) 09/14/2024   MCV 86.4 09/14/2024   PLT 273 09/14/2024      Chemistry      Component Value Date/Time   NA 138 07/12/2024 0608   NA 138 09/02/2023 1600   K 3.7 07/12/2024 0608   CL 105 07/12/2024 0608   CO2 21 (L) 07/12/2024 0608   BUN 15 07/12/2024 0608   BUN 15 09/02/2023 1600   CREATININE 0.68 07/12/2024 0608   CREATININE 0.84 11/14/2020 1239      Component Value Date/Time   CALCIUM 9.8 07/12/2024 0608   ALKPHOS 63 06/16/2024 0943   AST 38 06/16/2024 0943   AST 21 11/14/2020 1239   ALT 15 06/16/2024 0943   ALT 15 11/14/2020 1239   BILITOT 0.7 06/16/2024 0943   BILITOT 0.3 09/02/2023 1600   BILITOT 0.4 11/14/2020 1239       RADIOGRAPHIC STUDIES:  No results found.    ASSESSMENT/PLAN:  This is a very pleasant 33 years old African-American female with history of iron deficiency anemia secondary to menorrhagia as well  as gastrointestinal blood loss.    The patient also has history of Crohn's disease and currently on treatment with Remicade . She has a history of vitamin B12 deficiency.    She receives IV iron infusion with fereheme, most recent being on 06/19/24. She cannot tolerate iron supplements.    The patient had repeat labs today with CBC, iron studies, and ferritin. Her labs today show worsening normocytic anemia with a hemoglobin of 10.7.  I do suspect that she will require IV iron infusions at this time.  I will place an order for the [facility] history infusion center.  We will arrange for Feraheme  weekly x 2.  Will also add on B12 labs today because of her history of ileum resection.  If this is low we would likely recommend B12 injections.     We will see her back  for a follow up visit in 8 weeks for evaluation and repeat blood work.   The patient was advised to call immediately if she has any concerning symptoms in the interval. The patient voices understanding of current disease status and treatment options and is in agreement with the current care plan. All questions were answered. The patient knows to call the clinic with any problems, questions or concerns. We can certainly see the patient much sooner if necessary  Orders Placed This Encounter  Procedures   Vitamin B12    Standing Status:   Future    Expected Date:   09/14/2024    Expiration Date:   09/14/2025   CBC with Differential (Cancer Center Only)    Standing Status:   Future    Expected Date:   11/15/2024    Expiration Date:   09/14/2025   Ferritin    Standing Status:   Future    Expected Date:   11/09/2024    Expiration Date:   09/14/2025   Iron and Iron Binding Capacity ([facility] only)    Standing Status:   Future    Expected Date:   11/16/2024    Expiration Date:   09/14/2025   Vitamin B12    Standing Status:   Future    Expected Date:   11/16/2024    Expiration Date:   09/14/2025     The total time spent in the appointment was 20-29 minutes  Trenton Passow L Latiana Tomei, PA-C 09/14/24

## 2024-09-14 ENCOUNTER — Other Ambulatory Visit: Payer: Self-pay | Admitting: *Deleted

## 2024-09-14 ENCOUNTER — Inpatient Hospital Stay: Payer: MEDICAID | Admitting: Physician Assistant

## 2024-09-14 ENCOUNTER — Inpatient Hospital Stay: Payer: MEDICAID | Attending: Physician Assistant

## 2024-09-14 VITALS — BP 119/84 | HR 72 | Temp 98.6°F | Resp 15 | Ht 62.0 in | Wt 148.7 lb

## 2024-09-14 DIAGNOSIS — K5010 Crohn's disease of large intestine without complications: Secondary | ICD-10-CM | POA: Insufficient documentation

## 2024-09-14 DIAGNOSIS — D500 Iron deficiency anemia secondary to blood loss (chronic): Secondary | ICD-10-CM | POA: Insufficient documentation

## 2024-09-14 DIAGNOSIS — Z793 Long term (current) use of hormonal contraceptives: Secondary | ICD-10-CM | POA: Insufficient documentation

## 2024-09-14 DIAGNOSIS — E538 Deficiency of other specified B group vitamins: Secondary | ICD-10-CM | POA: Diagnosis not present

## 2024-09-14 DIAGNOSIS — D509 Iron deficiency anemia, unspecified: Secondary | ICD-10-CM

## 2024-09-14 DIAGNOSIS — K50919 Crohn's disease, unspecified, with unspecified complications: Secondary | ICD-10-CM

## 2024-09-14 DIAGNOSIS — N920 Excessive and frequent menstruation with regular cycle: Secondary | ICD-10-CM | POA: Insufficient documentation

## 2024-09-14 DIAGNOSIS — Z7901 Long term (current) use of anticoagulants: Secondary | ICD-10-CM | POA: Diagnosis not present

## 2024-09-14 LAB — IRON AND IRON BINDING CAPACITY (CC-WL,HP ONLY)
Iron: 22 ug/dL — ABNORMAL LOW (ref 28–170)
Saturation Ratios: 6 % — ABNORMAL LOW (ref 10.4–31.8)
TIBC: 399 ug/dL (ref 250–450)
UIBC: 377 ug/dL (ref 148–442)

## 2024-09-14 LAB — VITAMIN B12: Vitamin B-12: 978 pg/mL — ABNORMAL HIGH (ref 180–914)

## 2024-09-14 LAB — CBC WITH DIFFERENTIAL (CANCER CENTER ONLY)
Abs Immature Granulocytes: 0.03 K/uL (ref 0.00–0.07)
Basophils Absolute: 0.1 K/uL (ref 0.0–0.1)
Basophils Relative: 1 %
Eosinophils Absolute: 0.2 K/uL (ref 0.0–0.5)
Eosinophils Relative: 3 %
HCT: 32.5 % — ABNORMAL LOW (ref 36.0–46.0)
Hemoglobin: 10.7 g/dL — ABNORMAL LOW (ref 12.0–15.0)
Immature Granulocytes: 0 %
Lymphocytes Relative: 44 %
Lymphs Abs: 3.5 K/uL (ref 0.7–4.0)
MCH: 28.5 pg (ref 26.0–34.0)
MCHC: 32.9 g/dL (ref 30.0–36.0)
MCV: 86.4 fL (ref 80.0–100.0)
Monocytes Absolute: 0.7 K/uL (ref 0.1–1.0)
Monocytes Relative: 9 %
Neutro Abs: 3.5 K/uL (ref 1.7–7.7)
Neutrophils Relative %: 43 %
Platelet Count: 273 K/uL (ref 150–400)
RBC: 3.76 MIL/uL — ABNORMAL LOW (ref 3.87–5.11)
RDW: 13.5 % (ref 11.5–15.5)
WBC Count: 8 K/uL (ref 4.0–10.5)
nRBC: 0 % (ref 0.0–0.2)

## 2024-09-14 LAB — FERRITIN: Ferritin: 11 ng/mL (ref 11–307)

## 2024-09-15 ENCOUNTER — Encounter: Payer: Self-pay | Admitting: Physician Assistant

## 2024-09-15 ENCOUNTER — Other Ambulatory Visit (HOSPITAL_COMMUNITY): Payer: Self-pay | Admitting: Pharmacy Technician

## 2024-09-15 ENCOUNTER — Telehealth (HOSPITAL_COMMUNITY): Payer: Self-pay

## 2024-09-15 DIAGNOSIS — D509 Iron deficiency anemia, unspecified: Secondary | ICD-10-CM | POA: Insufficient documentation

## 2024-09-15 NOTE — Telephone Encounter (Signed)
 Auth Submission: NO AUTH NEEDED Site of care: Site of care: [facility] Payer: [employer] Medication & CPT/J Code(s) submitted: Feraheme  (ferumoxytol ) [id number] Diagnosis Code: D50.9 Route of submission (phone, fax, portal):  Phone # Fax # Auth type: Buy/Bill HB Units/visits requested: 510mg  x 2 doses Reference number:  Approval from: 09/15/24 to 11/08/24

## 2024-09-19 ENCOUNTER — Telehealth: Payer: Self-pay | Admitting: Internal Medicine

## 2024-09-19 NOTE — Telephone Encounter (Signed)
 Scheduled patient for next appointment. Called and spoke with the patient, she is aware.

## 2024-09-26 ENCOUNTER — Inpatient Hospital Stay (HOSPITAL_COMMUNITY): Admission: RE | Admit: 2024-09-26 | Payer: MEDICAID | Source: Ambulatory Visit

## 2024-10-03 ENCOUNTER — Ambulatory Visit (HOSPITAL_COMMUNITY)
Admission: RE | Admit: 2024-10-03 | Discharge: 2024-10-03 | Disposition: A | Discharge status: Home / Self Care | Payer: MEDICAID | Source: Ambulatory Visit | Attending: Internal Medicine | Admitting: Internal Medicine

## 2024-10-03 VITALS — BP 118/74 | HR 64 | Temp 97.6°F | Resp 16 | Wt 144.0 lb

## 2024-10-03 DIAGNOSIS — K50818 Crohn's disease of both small and large intestine with other complication: Secondary | ICD-10-CM | POA: Insufficient documentation

## 2024-10-03 MED ORDER — SODIUM CHLORIDE 0.9 % IV SOLN: INTRAVENOUS | Status: DC | PRN

## 2024-10-03 MED ORDER — DIPHENHYDRAMINE HCL 25 MG PO CAPS
25.0000 mg | ORAL_CAPSULE | Freq: Once | ORAL | Status: AC
  Administered 2024-10-03: 25 mg via ORAL
  Filled 2024-10-03: qty 1

## 2024-10-03 MED ORDER — SODIUM CHLORIDE 0.9 % IV SOLN
700.0000 mg | Freq: Once | INTRAVENOUS | Status: AC
  Administered 2024-10-03: 700 mg via INTRAVENOUS
  Filled 2024-10-03: qty 70

## 2024-10-03 MED ORDER — ACETAMINOPHEN 325 MG PO TABS
650.0000 mg | ORAL_TABLET | Freq: Once | ORAL | Status: AC
  Administered 2024-10-03: 650 mg via ORAL
  Filled 2024-10-03: qty 2

## 2024-10-03 NOTE — Progress Notes (Signed)
 Diagnosis: Crohn's disease of both small and large intestine with other Complications    Provider: Aloha finner MD    Procedure: Remicade  10mg /kg infusion    Note: Patient received 700 mg Remicade  infusion ( 3of 3) via PIV .  Pt was premedicated with Tylenol  and Benadryl  po.  No labs required per therapy plan.  Infusion titrated per protocol.  Patient tolerated well with no averse reaction.  Vital signs stable.  Pt declined AVS but provided with excuse for work note.  Patient to come back every 8 weeks  for infusion, instructed to schedule next infusion upon exiting at front desk.  Pt verbalized understanding.  Pt alert, oriented and ambulatory at discharge.

## 2024-10-10 ENCOUNTER — Ambulatory Visit (HOSPITAL_COMMUNITY): Payer: MEDICAID

## 2024-10-12 ENCOUNTER — Ambulatory Visit (HOSPITAL_COMMUNITY)
Admission: RE | Admit: 2024-10-12 | Discharge: 2024-10-12 | Disposition: A | Discharge status: Home / Self Care | Payer: MEDICAID | Source: Ambulatory Visit | Attending: Internal Medicine | Admitting: Internal Medicine

## 2024-10-12 VITALS — BP 123/84 | HR 68 | Temp 97.9°F | Resp 16

## 2024-10-12 DIAGNOSIS — D509 Iron deficiency anemia, unspecified: Secondary | ICD-10-CM | POA: Insufficient documentation

## 2024-10-12 MED ORDER — ACETAMINOPHEN 325 MG PO TABS
650.0000 mg | ORAL_TABLET | Freq: Once | ORAL | Status: AC
  Administered 2024-10-12: 650 mg via ORAL
  Filled 2024-10-12: qty 2

## 2024-10-12 MED ORDER — DIPHENHYDRAMINE HCL 25 MG PO CAPS
25.0000 mg | ORAL_CAPSULE | Freq: Once | ORAL | Status: AC
  Administered 2024-10-12: 25 mg via ORAL
  Filled 2024-10-12: qty 1

## 2024-10-12 MED ORDER — SODIUM CHLORIDE 0.9 % IV SOLN
510.0000 mg | Freq: Once | INTRAVENOUS | Status: AC
  Administered 2024-10-12: 510 mg via INTRAVENOUS
  Filled 2024-10-12: qty 17

## 2024-10-12 MED ORDER — SODIUM CHLORIDE 0.9 % IV SOLN: INTRAVENOUS | Status: DC | PRN

## 2024-10-12 NOTE — Progress Notes (Signed)
 PATIENT CARE CENTER NOTE   Diagnosis: Iron deficiency anemia, unspecified iron deficiency anemia type [D50.9]    Provider: Cassandra L Heilingoetter, PA-C    Procedure: Feraheme  infusion    Note: Patient received Feraheme  infusion (dose # 1 of 2) via PIV. Pre-medicated patient with 650 mg Tylenol  and 25 mg Benadryl  per order. Rate of infusion decreased and infusion ran over 25 minutes per patient request. Patient tolerated infusion well with no adverse reaction. Observed patient for 30 minutes post infusion. Vital signs stable. AVS offered to patient but patient declined. Patient to come back in 1 week for next infusion and advised to schedule next appointment at the front desk. Patient alert, oriented and ambulatory at discharge.

## 2024-11-03 ENCOUNTER — Encounter: Payer: MEDICAID | Admitting: Family Medicine

## 2024-11-10 ENCOUNTER — Telehealth (HOSPITAL_COMMUNITY): Payer: Self-pay

## 2024-11-10 NOTE — Telephone Encounter (Signed)
 Auth Submission: NO AUTH NEEDED Site of care: Site of care: [facility] Payer: [employer] Medication & CPT/J Code(s) submitted: Feraheme  (ferumoxytol ) [id number] Diagnosis Code: D50.9 Route of submission (phone, fax, portal):  Phone # Fax # Auth type: Buy/Bill HB Units/visits requested: 510mg  x 1 dose Reference number:  Approval from: 11/10/24 to 02/08/25

## 2024-11-10 NOTE — Telephone Encounter (Signed)
 Auth Submission: NO AUTH NEEDED Site of care: Site of care: [facility] Payer: [employer] Medication & CPT/J Code(s) submitted: Remicade  (Infliximab ) J1745 Diagnosis Code: K50.818 Route of submission (phone, fax, portal):  Phone # Fax # Auth type: Buy/Bill HB Units/visits requested: 10mg /kg q8weeks Reference number:  Approval from: 11/10/24 to 11/08/25

## 2024-11-14 ENCOUNTER — Inpatient Hospital Stay (HOSPITAL_COMMUNITY): Admission: RE | Admit: 2024-11-14 | Payer: MEDICAID | Source: Ambulatory Visit

## 2024-11-21 ENCOUNTER — Ambulatory Visit (HOSPITAL_COMMUNITY)
Admission: RE | Admit: 2024-11-21 | Discharge: 2024-11-21 | Disposition: A | Discharge status: Home / Self Care | Payer: MEDICAID | Source: Ambulatory Visit | Attending: Internal Medicine | Admitting: Internal Medicine

## 2024-11-21 ENCOUNTER — Telehealth: Payer: Self-pay | Admitting: Internal Medicine

## 2024-11-21 ENCOUNTER — Inpatient Hospital Stay: Payer: MEDICAID | Admitting: Internal Medicine

## 2024-11-21 ENCOUNTER — Ambulatory Visit (HOSPITAL_COMMUNITY): Payer: MEDICAID

## 2024-11-21 ENCOUNTER — Inpatient Hospital Stay: Payer: MEDICAID

## 2024-11-21 VITALS — BP 114/80 | HR 61 | Temp 97.6°F | Resp 16

## 2024-11-21 DIAGNOSIS — D509 Iron deficiency anemia, unspecified: Secondary | ICD-10-CM | POA: Diagnosis present

## 2024-11-21 DIAGNOSIS — K50818 Crohn's disease of both small and large intestine with other complication: Secondary | ICD-10-CM | POA: Insufficient documentation

## 2024-11-21 MED ORDER — DIPHENHYDRAMINE HCL 25 MG PO CAPS
25.0000 mg | ORAL_CAPSULE | Freq: Once | ORAL | Status: AC
  Administered 2024-11-21: 25 mg via ORAL
  Filled 2024-11-21: qty 1

## 2024-11-21 MED ORDER — SODIUM CHLORIDE 0.9 % IV SOLN: INTRAVENOUS | Status: DC | PRN

## 2024-11-21 MED ORDER — SODIUM CHLORIDE 0.9 % IV SOLN
510.0000 mg | Freq: Once | INTRAVENOUS | Status: AC
  Administered 2024-11-21: 510 mg via INTRAVENOUS
  Filled 2024-11-21: qty 17

## 2024-11-21 MED ORDER — ACETAMINOPHEN 325 MG PO TABS
650.0000 mg | ORAL_TABLET | Freq: Once | ORAL | Status: AC
  Administered 2024-11-21: 650 mg via ORAL
  Filled 2024-11-21: qty 2

## 2024-11-21 NOTE — Progress Notes (Signed)
"               PATIENT CARE CENTER NOTE     Diagnosis: Iron deficiency anemia, unspecified iron deficiency anemia type [D50.9]      Provider: Cassandra L Heilingoetter, PA-C      Procedure: Feraheme  infusion      Note: Patient received Feraheme  infusion (dose # 2 of 2) via PIV. Patient pre-medicated with 650 mg Tylenol  PO and 25 mg Benadryl  PO per order. Rate of infusion decreased and infusion ran at 250 cc over 30 minutes per patient request. Patient tolerated infusion well with no adverse reaction. Observed patient for 30 minutes post infusion. Vital signs stable. AVS offered to patient but patient declined. Patient alert, oriented and ambulatory at discharge. "

## 2024-11-28 ENCOUNTER — Ambulatory Visit (HOSPITAL_COMMUNITY)
Admission: RE | Admit: 2024-11-28 | Discharge: 2024-11-28 | Disposition: A | Payer: MEDICAID | Source: Ambulatory Visit | Attending: Internal Medicine

## 2024-11-28 ENCOUNTER — Ambulatory Visit: Payer: MEDICAID | Admitting: Gastroenterology

## 2024-11-28 VITALS — BP 110/64 | HR 61 | Temp 98.0°F | Resp 16 | Wt 144.0 lb

## 2024-11-28 DIAGNOSIS — K50818 Crohn's disease of both small and large intestine with other complication: Secondary | ICD-10-CM

## 2024-11-28 MED ORDER — SODIUM CHLORIDE 0.9 % IV SOLN
700.0000 mg | Freq: Once | INTRAVENOUS | Status: AC
  Administered 2024-11-28: 700 mg via INTRAVENOUS
  Filled 2024-11-28: qty 70

## 2024-11-28 MED ORDER — DIPHENHYDRAMINE HCL 25 MG PO CAPS
25.0000 mg | ORAL_CAPSULE | Freq: Once | ORAL | Status: AC
  Administered 2024-11-28: 25 mg via ORAL
  Filled 2024-11-28: qty 1

## 2024-11-28 MED ORDER — ACETAMINOPHEN 325 MG PO TABS
650.0000 mg | ORAL_TABLET | Freq: Once | ORAL | Status: AC
  Administered 2024-11-28: 650 mg via ORAL
  Filled 2024-11-28: qty 2

## 2024-11-28 MED ORDER — SODIUM CHLORIDE 0.9 % IV SOLN: INTRAVENOUS | Status: DC | PRN

## 2024-11-28 NOTE — Progress Notes (Signed)
"            PATIENT CARE CENTER NOTE     Diagnosis: Crohn's disease of both small and large intestine with other complication (HCC) (X49.181)     Provider: Wilhelmenia Roers,  MD     Procedure: Remicade  infusion 700 mg     Note: Patient received Remicade  infusion via PIV. Patient pre-medicated with 650 mg Tylenol  PO and 25 mg Benadryl  PO as ordered. Infusion titrated per protocol. Patient tolerated infusion well with no adverse reaction. Vital signs stable. Patient declined AVS and declined to stay for 1 hour post infusion observation. Patient to come back every 8 weeks for infusions and will schedule the next appointment at the front desk. Patient alert, oriented and ambulatory at discharge. "

## 2024-11-29 NOTE — Progress Notes (Unsigned)
 [facility] OFFICE PROGRESS NOTE  Tanda Bleacher, MD 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  Last dose was giving in 11/21/24  INTERVAL HISTORY: Erika Stephens 34 y.o. female returns to the clinic today for a follow up visit. The patient is followed for iron deficiency anemia. She was last seen on 11/15/23. Her last iron infusion was on 11/21/24 with feraheme .   The patient has Crohn's disease and ilium resection. She receives Remicade .   She is overall feeling fairly well. She states that she is seeing a vascular provider for possible superficial clot. He has an appointment for physical next week. She has history of ileum resection and states that she is taking B12 supplements. She is not taking any B12 injections. She takes a multivitamin as well. She denies any visible bleeding. States her menstrual cycles are not heavy.   She is here for evaluation and repeat blood work.    MEDICAL HISTORY: Past Medical History:  Diagnosis Date   Anemia    Anxiety    Blood transfusion without reported diagnosis    Central centrifugal scarring alopecia    Chronic swimmer's ear of right side    Crohn's colitis (HCC)    IDA (iron deficiency anemia)    Impetigo    Postinflammatory hyperpigmentation    Pre-eclampsia in postpartum period 01/14/2019   Scalp psoriasis    Seborrheic dermatitis of scalp     ALLERGIES:  is allergic to adalimumab.  MEDICATIONS:  Current Outpatient Medications  Medication Sig Dispense Refill   APIXABAN  (ELIQUIS ) VTE STARTER PACK (10MG  AND 5MG ) Take as directed on package: start with two-5mg  tablets twice daily for 7 days. On day 8,  switch to one-5mg  tablet twice daily. (Patient not taking: Reported on 08/30/2024) 74 each 0   Cobalamin Combinations (B-12) [phone] MCG SUBL Place 1 Dose under the tongue daily at 12 noon. (Patient not taking: Reported on 06/15/2024)     etonogestrel -ethinyl estradiol  (NUVARING) 0.12-0.015 MG/24HR vaginal ring Insert vaginally and leave in place for 3 consecutive weeks, then remove for 1 week. (Patient not taking: Reported on 08/30/2024)     FeFum-FePoly-FA-B Cmp-C-Biot (INTEGRA PLUS ) CAPS Take 1 capsule by mouth every morning. 30 capsule 2   inFLIXimab  (REMICADE  IV) Inject into the vein. Every 8 weeks     Multiple Vitamin (MULTI-VITAMIN) tablet Take by mouth. (Patient not taking: Reported on 08/30/2024)     ondansetron  (ZOFRAN -ODT) 4 MG disintegrating tablet Take 1 tablet (4 mg total) by mouth every 8 (eight) hours as needed for nausea or vomiting. 10 tablet 0   Prenatal Vit-Fe Fumarate-FA (PRENATAL MULTIVITAMIN) TABS tablet Take 1 tablet by mouth daily at 12 noon. (Patient not taking: Reported on 06/15/2024) 30 tablet 0   SUMAtriptan  (IMITREX ) 25 MG tablet Take 25 mg (1 tablet total) by mouth at the start of the headache. May repeat in 2 hours x 1 if headache persists. Max of 2 tablets/24 hours. (Patient not taking: Reported on 06/15/2024) 10 tablet 0   No current facility-administered medications for this visit.    SURGICAL HISTORY:  Past Surgical History:  Procedure Laterality Date   APPENDECTOMY     BOWEL RESECTION     COLONOSCOPY  UPPER GASTROINTESTINAL ENDOSCOPY      REVIEW OF SYSTEMS:   Review of Systems  Constitutional: Negative for appetite change, chills, fatigue, fever and unexpected weight change.  HENT:   Negative for mouth sores, nosebleeds, sore throat and trouble swallowing.   Eyes: Negative for eye problems and icterus.  Respiratory: Negative for cough, hemoptysis, shortness of breath and wheezing.   Cardiovascular: Negative for chest pain and leg swelling.   Gastrointestinal: Negative for abdominal pain, constipation, diarrhea, nausea and vomiting.  Genitourinary: Negative for bladder incontinence, difficulty urinating, dysuria, frequency and hematuria.   Musculoskeletal: Negative for back pain, gait problem, neck pain and neck stiffness.  Skin: Negative for itching and rash.  Neurological: Negative for dizziness, extremity weakness, gait problem, headaches, light-headedness and seizures.  Hematological: Negative for adenopathy. Does not bruise/bleed easily.  Psychiatric/Behavioral: Negative for confusion, depression and sleep disturbance. The patient is not nervous/anxious.     PHYSICAL EXAMINATION:  There were no vitals taken for this visit.  ECOG PERFORMANCE STATUS: {CHL ONC ECOG [id number]  Physical Exam  Constitutional: Oriented to person, place, and time and well-developed, well-nourished, and in no distress. No distress.  HENT:  Head: Normocephalic and atraumatic.  Mouth/Throat: Oropharynx is clear and moist. No oropharyngeal exudate.  Eyes: Conjunctivae are normal. Right eye exhibits no discharge. Left eye exhibits no discharge. No scleral icterus.  Neck: Normal range of motion. Neck supple.  Cardiovascular: Normal rate, regular rhythm, normal heart sounds and intact distal pulses.   Pulmonary/Chest: Effort normal and breath sounds normal. No respiratory distress. No wheezes. No rales.  Abdominal: Soft. Bowel sounds are normal. Exhibits no distension and no mass. There is no tenderness.  Musculoskeletal: Normal range of motion. Exhibits no edema.  Lymphadenopathy:    No cervical adenopathy.  Neurological: Alert and oriented to person, place, and time. Exhibits normal muscle tone. Gait normal. Coordination normal.  Skin: Skin is warm and dry. No rash noted. Not diaphoretic. No erythema. No pallor.  Psychiatric: Mood, memory and judgment normal.  Vitals reviewed.  LABORATORY DATA: Lab Results  Component Value Date   WBC 8.0  09/14/2024   HGB 10.7 (L) 09/14/2024   HCT 32.5 (L) 09/14/2024   MCV 86.4 09/14/2024   PLT 273 09/14/2024      Chemistry      Component Value Date/Time   NA 138 07/12/2024 0608   NA 138 09/02/2023 1600   K 3.7 07/12/2024 0608   CL 105 07/12/2024 0608   CO2 21 (L) 07/12/2024 0608   BUN 15 07/12/2024 0608   BUN 15 09/02/2023 1600   CREATININE 0.68 07/12/2024 0608   CREATININE 0.84 11/14/2020 1239      Component Value Date/Time   CALCIUM 9.8 07/12/2024 0608   ALKPHOS 63 06/16/2024 0943   AST 38 06/16/2024 0943   AST 21 11/14/2020 1239   ALT 15 06/16/2024 0943   ALT 15 11/14/2020 1239   BILITOT 0.7 06/16/2024 0943   BILITOT 0.3 09/02/2023 1600   BILITOT 0.4 11/14/2020 1239       RADIOGRAPHIC STUDIES:  No results found.   ASSESSMENT/PLAN:  This is a very pleasant 34 years old African-American female with history of iron deficiency anemia secondary to menorrhagia as well as gastrointestinal blood loss.    The patient also has history of Crohn's disease and currently on treatment with Remicade . She has a history of vitamin B12 deficiency.    She receives IV iron infusion with fereheme, most recent being on 11/21/24. She cannot tolerate iron  supplements.    The patient had repeat labs today with CBC, iron studies, and ferritin. Her labs today show *** anemia with a hemoglobin of ***.    ***We will arrange for Feraheme  weekly x 2.  Will also add on B12 labs today because of her history of ileum resection.  If this is low we would likely recommend B12 injections.     We will see her back for a follow up visit in 8 weeks for evaluation and repeat blood work.  The patient was advised to call immediately if she has any concerning symptoms in the interval. The patient voices understanding of current disease status and treatment options and is in agreement with the current care plan. All questions were answered. The patient knows to call the clinic with any problems, questions  or concerns. We can certainly see the patient much sooner if necessary   No orders of the defined types were placed in this encounter.    I spent {CHL ONC TIME VISIT - DTPQU:[id number]} counseling the patient face to face. The total time spent in the appointment was {CHL ONC TIME VISIT - DTPQU:[id number]}.  Rolly Magri L Jafar Poffenberger, PA-C 11/29/24

## 2024-12-04 ENCOUNTER — Inpatient Hospital Stay: Payer: MEDICAID | Admitting: Physician Assistant

## 2024-12-04 ENCOUNTER — Inpatient Hospital Stay: Payer: MEDICAID | Attending: Physician Assistant

## 2024-12-12 ENCOUNTER — Other Ambulatory Visit: Payer: Self-pay

## 2024-12-12 ENCOUNTER — Encounter: Payer: Self-pay | Admitting: Emergency Medicine

## 2024-12-12 ENCOUNTER — Ambulatory Visit
Admission: EM | Admit: 2024-12-12 | Discharge: 2024-12-12 | Disposition: A | Discharge status: Home / Self Care | Payer: MEDICAID | Source: Home / Self Care

## 2024-12-12 DIAGNOSIS — S060X0A Concussion without loss of consciousness, initial encounter: Secondary | ICD-10-CM | POA: Diagnosis not present

## 2024-12-12 MED ORDER — KETOROLAC TROMETHAMINE 30 MG/ML IJ SOLN
30.0000 mg | Freq: Once | INTRAMUSCULAR | Status: AC
  Administered 2024-12-12: 30 mg via INTRAMUSCULAR

## 2024-12-12 MED ORDER — DEXAMETHASONE SOD PHOSPHATE PF 10 MG/ML IJ SOLN
10.0000 mg | Freq: Once | INTRAMUSCULAR | Status: AC
  Administered 2024-12-12: 10 mg via INTRAMUSCULAR

## 2024-12-12 NOTE — ED Triage Notes (Addendum)
 Pt here for slip and fall on ice last night hitting the back of her head; no LOC: pt sts right shoulder and pain in back of head; pt sts some nausea

## 2024-12-12 NOTE — Discharge Instructions (Signed)
 You have been diagnosed with a closed head trauma and have been advised to do brain rest for 24 to 48 hours.  This is to rest your brain in order to assist in healing after brain trauma.  Brain rest includes no screen time of any form (he no television, no phone, no iPad, no computer, etc.), no loud music, no bright lights, no vigorous activity, or heavy concentration (that means schoolwork, studying, reading, puzzles, or work).  You may sleep is much as you want but the object is to be bored so that your brain can rest.  Treat your pain with ibuprofen and Tylenol.  If your symptoms do not seem to be improving you will need to follow-up with PCP or neurology.

## 2024-12-20 ENCOUNTER — Encounter: Payer: Self-pay | Admitting: Family Medicine

## 2024-12-29 ENCOUNTER — Inpatient Hospital Stay: Payer: MEDICAID | Admitting: Physician Assistant

## 2024-12-29 ENCOUNTER — Inpatient Hospital Stay: Payer: MEDICAID | Attending: Physician Assistant
